# Patient Record
Sex: Female | Born: 1935 | Race: White | Hispanic: No | Smoking: Former smoker
Health system: Southern US, Community
[De-identification: ages and names within clinical notes are randomized; demographics above are authoritative.]

## PROBLEM LIST (undated history)

## (undated) DIAGNOSIS — Z8619 Personal history of other infectious and parasitic diseases: Secondary | ICD-10-CM

## (undated) DIAGNOSIS — R0982 Postnasal drip: Secondary | ICD-10-CM

## (undated) DIAGNOSIS — M169 Osteoarthritis of hip, unspecified: Secondary | ICD-10-CM

## (undated) DIAGNOSIS — M1990 Unspecified osteoarthritis, unspecified site: Secondary | ICD-10-CM

## (undated) DIAGNOSIS — C4491 Basal cell carcinoma of skin, unspecified: Secondary | ICD-10-CM

## (undated) DIAGNOSIS — E039 Hypothyroidism, unspecified: Secondary | ICD-10-CM

## (undated) DIAGNOSIS — E049 Nontoxic goiter, unspecified: Secondary | ICD-10-CM

## (undated) DIAGNOSIS — M8580 Other specified disorders of bone density and structure, unspecified site: Secondary | ICD-10-CM

## (undated) DIAGNOSIS — T4145XA Adverse effect of unspecified anesthetic, initial encounter: Secondary | ICD-10-CM

## (undated) DIAGNOSIS — H9190 Unspecified hearing loss, unspecified ear: Secondary | ICD-10-CM

## (undated) DIAGNOSIS — T8859XA Other complications of anesthesia, initial encounter: Secondary | ICD-10-CM

## (undated) DIAGNOSIS — K5730 Diverticulosis of large intestine without perforation or abscess without bleeding: Secondary | ICD-10-CM

## (undated) DIAGNOSIS — G43909 Migraine, unspecified, not intractable, without status migrainosus: Secondary | ICD-10-CM

## (undated) HISTORY — DX: Osteoarthritis of hip, unspecified: M16.9

## (undated) HISTORY — DX: Personal history of other infectious and parasitic diseases: Z86.19

## (undated) HISTORY — PX: CERVICAL SPINE SURGERY: SHX589

## (undated) HISTORY — PX: EYE SURGERY: SHX253

## (undated) HISTORY — PX: BASAL CELL CARCINOMA EXCISION: SHX1214

## (undated) HISTORY — PX: INNER EAR SURGERY: SHX679

## (undated) HISTORY — DX: Migraine, unspecified, not intractable, without status migrainosus: G43.909

## (undated) HISTORY — PX: MASTOIDECTOMY: SHX711

## (undated) HISTORY — PX: KNEE ARTHROSCOPY: SUR90

## (undated) HISTORY — PX: TONSILLECTOMY: SUR1361

## (undated) HISTORY — PX: NASAL SEPTUM SURGERY: SHX37

## (undated) HISTORY — DX: Other specified disorders of bone density and structure, unspecified site: M85.80

## (undated) HISTORY — DX: Diverticulosis of large intestine without perforation or abscess without bleeding: K57.30

## (undated) HISTORY — DX: Unspecified hearing loss, unspecified ear: H91.90

---

## 1975-07-06 DIAGNOSIS — C4491 Basal cell carcinoma of skin, unspecified: Secondary | ICD-10-CM

## 1975-07-06 HISTORY — DX: Basal cell carcinoma of skin, unspecified: C44.91

## 1975-08-03 DIAGNOSIS — C4491 Basal cell carcinoma of skin, unspecified: Secondary | ICD-10-CM

## 1975-08-03 HISTORY — DX: Basal cell carcinoma of skin, unspecified: C44.91

## 1999-08-03 ENCOUNTER — Other Ambulatory Visit: Admission: RE | Admit: 1999-08-03 | Discharge: 1999-08-03 | Payer: Self-pay | Admitting: *Deleted

## 2000-08-09 ENCOUNTER — Other Ambulatory Visit: Admission: RE | Admit: 2000-08-09 | Discharge: 2000-08-09 | Payer: Self-pay | Admitting: *Deleted

## 2000-09-15 DIAGNOSIS — C4492 Squamous cell carcinoma of skin, unspecified: Secondary | ICD-10-CM

## 2000-09-15 HISTORY — DX: Squamous cell carcinoma of skin, unspecified: C44.92

## 2003-03-17 ENCOUNTER — Ambulatory Visit (HOSPITAL_COMMUNITY): Admission: RE | Admit: 2003-03-17 | Discharge: 2003-03-17 | Payer: Self-pay | Admitting: Neurosurgery

## 2008-09-03 ENCOUNTER — Ambulatory Visit (HOSPITAL_COMMUNITY): Admission: RE | Admit: 2008-09-03 | Discharge: 2008-09-06 | Payer: Self-pay | Admitting: Obstetrics and Gynecology

## 2008-09-03 ENCOUNTER — Encounter (INDEPENDENT_AMBULATORY_CARE_PROVIDER_SITE_OTHER): Payer: Self-pay | Admitting: Obstetrics and Gynecology

## 2009-01-09 HISTORY — PX: ABDOMINAL HYSTERECTOMY: SHX81

## 2010-04-16 LAB — DIFFERENTIAL
Basophils Relative: 1 % (ref 0–1)
Lymphocytes Relative: 38 % (ref 12–46)
Lymphs Abs: 3.1 10*3/uL (ref 0.7–4.0)
Monocytes Absolute: 0.5 10*3/uL (ref 0.1–1.0)
Monocytes Relative: 6 % (ref 3–12)
Neutro Abs: 4.3 10*3/uL (ref 1.7–7.7)
Neutrophils Relative %: 53 % (ref 43–77)

## 2010-04-16 LAB — CBC
HCT: 31.8 % — ABNORMAL LOW (ref 36.0–46.0)
HCT: 34.5 % — ABNORMAL LOW (ref 36.0–46.0)
HCT: 39 % (ref 36.0–46.0)
Hemoglobin: 10.8 g/dL — ABNORMAL LOW (ref 12.0–15.0)
Hemoglobin: 11.2 g/dL — ABNORMAL LOW (ref 12.0–15.0)
Hemoglobin: 11.8 g/dL — ABNORMAL LOW (ref 12.0–15.0)
Hemoglobin: 13.3 g/dL (ref 12.0–15.0)
MCHC: 34 g/dL (ref 30.0–36.0)
MCHC: 34.2 g/dL (ref 30.0–36.0)
MCHC: 34.2 g/dL (ref 30.0–36.0)
MCHC: 34.6 g/dL (ref 30.0–36.0)
MCV: 94.3 fL (ref 78.0–100.0)
Platelets: 212 10*3/uL (ref 150–400)
RBC: 3.33 MIL/uL — ABNORMAL LOW (ref 3.87–5.11)
RBC: 3.42 MIL/uL — ABNORMAL LOW (ref 3.87–5.11)
RBC: 3.66 MIL/uL — ABNORMAL LOW (ref 3.87–5.11)
RDW: 13.6 % (ref 11.5–15.5)
RDW: 13.7 % (ref 11.5–15.5)
RDW: 13.7 % (ref 11.5–15.5)
RDW: 13.7 % (ref 11.5–15.5)

## 2010-04-16 LAB — COMPREHENSIVE METABOLIC PANEL
Albumin: 4 g/dL (ref 3.5–5.2)
BUN: 14 mg/dL (ref 6–23)
Calcium: 9.2 mg/dL (ref 8.4–10.5)
Creatinine, Ser: 0.74 mg/dL (ref 0.4–1.2)
Glucose, Bld: 79 mg/dL (ref 70–99)
Potassium: 3.9 mEq/L (ref 3.5–5.1)
Total Protein: 7.3 g/dL (ref 6.0–8.3)

## 2010-04-16 LAB — CREATININE, SERUM: Creatinine, Ser: 0.69 mg/dL (ref 0.4–1.2)

## 2010-05-24 NOTE — Discharge Summary (Signed)
Virginia Bullock, CAPES NO.:  [id number]   MEDICAL RECORD NO.:  [id number]          PATIENT TYPE:  OIB   LOCATION:  9302                          FACILITY:  [facility]   PHYSICIAN:  Malachi Pro. Ambrose Mantle, M.D. DATE OF BIRTH:  Feb 28, 1928   DATE OF ADMISSION:  09/03/2008  DATE OF DISCHARGE:  09/06/2008                               DISCHARGE SUMMARY   HISTORY OF PRESENT ILLNESS:  This is a 76 year old white female who was  admitted for uterine prolapse, cystocele, and rectocele.  The patient  underwent a vaginal hysterectomy, A and P repair on September 03, 2008 by  Dr. Ambrose Mantle under general anesthesia.  Catheter was removed on the first  postop day.  She was unable to void, she had the catheter replaced.  Second postop day, she was able to void only small drops.  Catheter was  replaced.  On the third postop day, she is able to void in more  significant amounts, but still has a large residual urine.  As the day  progresses, she is going to try to voiding.  If she is unable to get her  residual urine less than 200 mL, she is going to be discharged with the  catheter in place.  She has remained afebrile throughout the  hospitalization.  Her initial hemoglobin was 13.3, hematocrit 39, white  count 81,100, and platelet count 212,000.  Followup hemoglobin 11.8,  10.8, and 11.2.  Comprehensive metabolic profile was completely within  normal limits.  Estimated glomerular filtration rate was greater than  60.  The patient has been very distraught about possibly having to go  home with the catheter.  I have instructed her that presence of the  catheter is not a sole indication for keeping one in the hospital.  She  has been given an adequate trial to see if she can void.  She has also  been instructed that no urinary obstruction is present.  Otherwise,  catheter would not go in, and then hopefully, her bladder will respond  before discharge.  At the present time, the patient has agreed to  pack.  If she cannot get her residual urine to less than 200 mL, she will go  home with the catheter and return to my office in 3 days with the  catheter out, having been removed early that morning to check on her  postvoid residual.   FINAL DIAGNOSES:  Uterine prolapse with cystocele and rectocele, urinary  retention.   OPERATION:  Vaginal hysterectomy, A and P repair.   PATHOLOGY REPORT:  Pending.   The patient will be discharged on Cipro 500 mg by mouth every 12 hours  for 7 days because of repeated catheterizations.  She will return to me  in either 3 days or 2 weeks.  Followup examination depending on whether  she goes home with or without a cath.  Pack was removed on the first  postop day from her vagina.      Malachi Pro. Ambrose Mantle, M.D.  Electronically Signed     TFH/MEDQ  D:  09/06/2008  T:  09/06/2008  Job:  [id number] 

## 2010-05-24 NOTE — H&P (Signed)
NAME:  Virginia Bullock, NATH NO.:  [id number]   MEDICAL RECORD NO.:  [id number]          PATIENT TYPE:  AMB   LOCATION:  SDC                           [facility]ILITY:  [facility]   PHYSICIAN:  Malachi Pro. Ambrose Mantle, M.D. DATE OF BIRTH:  [birth year]   DATE OF ADMISSION:  DATE OF DISCHARGE:                              HISTORY & PHYSICAL   HISTORY OF PRESENT ILLNESS:  This is a 76 year old white widowed female,  para 5-0-0-5, who is admitted for vaginal hysterectomy/A and P repair  because of third to fourth degree cystocele that is symptomatic.  This  patient saw me on July 15, 2008, stating that she had noted a protrusion  from her vagina for approximately one month.  She stated she had some  stress urinary incontinence for years but that was not a problem.  Her  husband had died in 84.  She had had no sex since.  She occasionally  feels the urge to urinate when on her feet, goes to the restroom and  cannot void.  She is very active physically, mows her own yard.  On the  examination when I saw her July 7 her thyroid felt to be slightly  enlarged and almost substernal so I did a TSH which was returned as  0.233.  I called her medical doctor and in her absence an associate, Dr.  Azucena Cecil, spoke to me and he made an appointment for the patient and the  patient was told that everything was okay.  She wants to have the  protrusion repaired after being offered observation or pessary or  surgery.   PAST MEDICAL HISTORY:   ALLERGIES:  NO KNOWN DRUG ALLERGIES.   MEDICATIONS:  The patient does not take any medications except vitamins.   She has no major illnesses.   OPERATIONS:  1. She did have a ruptured disk in 1992.  2. Left knee surgery in 1981.  3. Benign breast tumor on the right in 1975.  4. As a child she had two mastoidectomies.   FAMILY HISTORY:  Her father died at 28 in World War II.  Mother died at  68.  One half sister is living and well.   SOCIAL HISTORY:  The patient does  not smoke.  She drinks some.  Her last  period was at age 47.   PHYSICAL EXAMINATION:  VITAL SIGNS:  Reveals multiple premature beats.  Her pulse is 60, blood pressure is 110/78.  She is 5 feet 2 inches.  weighs 134 pounds.   HEENT:  Reveals no cranial abnormalities.  Extraocular movements are  intact.  Nose and pharynx are clear.  NECK:  Supple.  There is slight enlargement of the thyroid.  LUNGS:  Clear to auscultation.  BREASTS:  Soft without masses.  HEART:  Normal size.  There are premature beats.  Rate is 60.  ABDOMEN:  Soft and nontender.  No masses are palpable.  EXTREMITIES:  Negative.  PELVIC:  The vulva and vagina are clean.  There is a third to fourth  degree cystocele.  The cervix is clean.  Uterus is hard to  feel.  Adnexa  are free of masses.  RECTAL:  Exam was negative.   At the time of her first exam a Pap smear was reported as atypical  glandular cells of undetermined significance.  Colposcopy revealed no  abnormalities.  Cervical biopsies were negative.  Endocervical curettage  showed benign endocervical mucosa and endometrial biopsy showed scanty  superficial strips of benign inactive endometrium.  No hyperplasia or  malignancy was identified.   IMPRESSION:  Uterine prolapse, third to fourth degree cystocele, smaller  rectocele.  The patient is admitted for vaginal hysterectomy/A and P  repair.  She understands the risks of surgery include, but are not  limited to, heart attack, stroke, pulmonary embolus, wound disruption,  hemorrhage with need for reoperation and/or transfusion, fistula  formation, nerve injury, intestinal obstruction.  She understands and  agrees to proceed.      Malachi Pro. Ambrose Mantle, M.D.  Electronically Signed     TFH/MEDQ  D:  09/02/2008  T:  09/02/2008  Job:  [id number]

## 2010-05-24 NOTE — Op Note (Signed)
NAMELATARRA, EAGLETON NO.:  [id number]   MEDICAL RECORD NO.:  [id number]          PATIENT TYPE:  OIB   LOCATION:  9302                          [facility]ACILITY:  [facility]   PHYSICIAN:  Malachi Pro. Ambrose Mantle, M.D. DATE OF BIRTH:  1928/03/21   DATE OF PROCEDURE:  09/03/2008  DATE OF DISCHARGE:                               OPERATIVE REPORT   PREOPERATIVE DIAGNOSES:  Cystocele, rectocele, and uterine prolapse.   POSTOPERATIVE DIAGNOSES:  Cystocele, rectocele, and uterine prolapse.   OPERATION:  Vaginal hysterectomy, anterior and posterior repair.   OPERATOR:  Malachi Pro. Ambrose Mantle, MD   ASSISTANT:  Zenaida Niece, MD   ANESTHESIA:  General anesthesia.   DESCRIPTION OF PROCEDURE:  The patient was brought to the operating room  and placed under satisfactory general anesthesia, placed in lithotomy  position in [place] stirrups.  The vulva, vagina, perineum, and urethra  were prepped with Betadine solution.  A Foley catheter was inserted to  straight drain.  Exam revealed the uterus to be hard to feel probably  midplane and small.  The adnexa were free of masses.  There was a third  to fourth degree cystocele and a third-degree rectocele.  The area was  draped as a sterile field.  The cervix was drawn into the operative  field after weighted speculum was placed posteriorly and a Deaver  retractor anteriorly and the cervicovaginal junction was injected with a  dilute solution of Neo-Synephrine.  A circumferential incision was made  around the cervix at the cervicovaginal junction.  The anterior vaginal  mucosa was pushed ahead.  The anterior peritoneum was identified, but  not entered.  The posterior peritoneum was identified, entered, and then  the anterior peritoneum was identified, and the bladder was retracted  away.  Confirmation within the peritoneal cavity anteriorly was done by  seeing the appendiceal epiploica.  Both uterosacral ligaments were  clamped, cut, and suture-ligated  and held.  The cardinal ligaments were  clamped, cut, and suture-ligated.  Parametrial tissues was handled in  the same fashion.  The uterus was quite vascular.  Backbleeding occurred  on both sides.  The uterus was then removed by transecting the upper  pedicles and doubly suture ligating them.  The posterior vaginal mucosa  was sewn to the posterior peritoneum with a running lock suture of 0  Vicryl and a pursestring suture was then placed from the anterior  peritoneum through the left upper pedicle, left uterosacral ligament,  posterior peritoneum, right uterosacral ligament, right upper pedicle,  and back to the anterior peritoneum.  There was no bleeding inside the  peritoneal cavity.  This suture was then tied down.  The upper pedicles  were cut away.  I did before closing the peritoneum, placed another  suture of 0 Vicryl through the uterosacral ligaments above where they  were held and they were initially cut.  The sutures holding the  uterosacral ligaments at this point were then tied together in the  midline.  The anterior vaginal mucosa was then separated from this large  cystocele all the way to the urethral meatus.  The  cystocele was  developed, was some bleeding controlled by the Bovie, and also sutures  of 3-0 Vicryl.  The cystocele was then obliterated with multiple  interrupted sutures of 0 Vicryl.  The redundant vaginal mucosa was cut  away and the vaginal mucosa was then reunited in the midline with  interrupted figure-of-eight sutures of 0 Vicryl and the vaginal cuff was  closed with the same suture material and with figure-of-eights.  The  posterior vaginal mucosa was then undermined to the cuff.  The rectocele  was developed and then before correcting the rectocele, I did a rectal  exam to ensure there has been no damage to the rectum.  I then  obliterated the rectocele with interrupted sutures of 0 Vicryl, cut away  redundant vaginal mucosa, and reapproximated the  vaginal mucosa in the  midline actually to the left of the midline with interrupted figure-of-  eight sutures of 0 Vicryl.  One suture was placed into the levator ani  muscles to give better support to the perineum.  Additional suture of 3-  0 Vicryl was used for complete hemostasis.  A 2-inch Iodoform pack was  placed into the vagina and the procedure was terminated.  Blood loss was  estimated by the nurse anesthetist at 250 mL.  Sponge and needle counts  were correct and the patient was returned to recovery in satisfactory  condition.      Malachi Pro. Ambrose Mantle, M.D.  Electronically Signed     TFH/MEDQ  D:  09/03/2008  T:  09/04/2008  Job:  [id number]

## 2011-09-28 ENCOUNTER — Other Ambulatory Visit: Payer: Self-pay | Admitting: Dermatology

## 2011-10-12 ENCOUNTER — Other Ambulatory Visit: Payer: Self-pay | Admitting: Dermatology

## 2013-01-07 ENCOUNTER — Other Ambulatory Visit: Payer: Self-pay | Admitting: Dermatology

## 2013-05-07 ENCOUNTER — Other Ambulatory Visit: Payer: Self-pay | Admitting: Dermatology

## 2014-02-05 ENCOUNTER — Ambulatory Visit: Payer: Self-pay | Admitting: Orthopedic Surgery

## 2014-02-05 NOTE — H&P (Signed)
Virginia Bullock DOB: Apr 08, 1928 Widowed / Language: Undefined / Race: White Female Date of Admission:  02/20/2014 CC:  Right Hip Pain History of Present Illness The patient is a 81 year old female who comes in  for a preoperative History and Physical. The patient is scheduled for a right total hip arthroplasty (anterior approach) to be performed by Dr. [name]. Aluisio, MD at [facility] on 02-20-2014. The patient reports right posterior hip problems including pain, stiffness and locking symptoms that have been present for many month(s). The symptoms began without any known injury. Symptoms reported include weakness, pain with weightbearing, night pain, stiffness and difficulty bearing weight The patient reports symptoms radiating to the: right thigh and right knee. The patient describes the hip problem as aching. Onset of symptoms was gradual.The symptoms are described as severe.The patient feels as if their symptoms are does feel they are worsening. Symptoms are exacerbated by weight bearing, walking and lying on the affected side. Symptoms are relieved by rest and nonsteroidal anti-inflammatory drugs. Current treatment includes nonsteroidal anti-inflammatory drugs. She has had accupuncture and manipulation. She states the chiropractor told her she needed a hip replacement many years ago. She states the pain will even radiate down to her right knee. She has had problems with her hips for years, but unfortunately in the past 3-4 months, it has gotten progressively worse. The pain is throughout the hip lateral and anterior, going into her thigh. She has lost a lot of movement. She is having pain with activity but also now having pain at rest. It is definitely limiting what she can and cannot do. She had been treated by a chiropractor, Dr. [name], and upon initiating treatment, he felt that the problem was from her hip. He subsequently sent her to [facility] for evaluation. She is ready to proceed with  hip replacement. They have been treated conservatively in the past for the above stated problem and despite conservative measures, they continue to have progressive pain and severe functional limitations and dysfunction. They have failed non-operative management including home exercise, medications. It is felt that they would benefit from undergoing total joint replacement. Risks and benefits of the procedure have been discussed with the patient and they elect to proceed with surgery. There are no active contraindications to surgery such as ongoing infection or rapidly progressive neurological disease.  Problem List/Past Medical Primary osteoarthritis of right hip (M16.11) Lactose intolerance (E73.9) Skin Cancer Squamous Cell and Basal Cell Migraine Headache Hypothyroidism Osteopenia Diverticulosis Shingles Impaired Hearing  Allergies Demerol *ANALGESICS - OPIOID* Vomiting. Codeine Sulfate *ANALGESICS - OPIOID* Vomiting. Benadryl *ANTIHISTAMINES* "sends me to the moon"  Family History First Degree Relatives reported Family history unknown - Adopted  Social History  Living situation live alone Marital status widowed Exercise Exercises daily; does other Children 5 or more Current drinker 11/20/2013: Currently drinks wine 5-7 times per week No history of drug/alcohol rehab Tobacco use Former smoker. 11/20/2013: smoke(d) 1 pack(s) per day Not under pain contract Tobacco / smoke exposure 11/20/2013: no  Medication History  Advil (200MG  Capsule, 1 (one) Oral) Active. Vitamin B-12 (1000MCG Tablet, 1 (one) Oral) Active. Vitamin C (500MG  Tablet, 1 (one) Oral) Active. Vitamin D (400UNIT Capsule, 1 (one) Oral) Active. (D3) Potassium Chloride (Oral) Specific dose unknown - Active. Magnesium Gluconate (Oral) Specific dose unknown - Active.   Past Surgical History Neck Disc Surgery Fusion Spinal Fusion neck Mastoidectomy bilateral; age 39 Arthroscopy of  Knee left Hysterectomy Date: 2011. partial (non-cancerous) Straighten Nasal Septum Tonsillectomy Twice -  age 41 and age 39   Review of Systems General Not Present- Chills, Fatigue, Fever, Memory Loss, Night Sweats, Weight Gain and Weight Loss. Skin Not Present- Eczema, Hives, Itching, Lesions and Rash. HEENT Not Present- Dentures, Double Vision, Headache, Hearing Loss, Tinnitus and Visual Loss. Respiratory Not Present- Allergies, Chronic Cough, Coughing up blood, Shortness of breath at rest and Shortness of breath with exertion. Cardiovascular Not Present- Chest Pain, Difficulty Breathing Lying Down, Murmur, Palpitations, Racing/skipping heartbeats and Swelling. Gastrointestinal Not Present- Abdominal Pain, Bloody Stool, Constipation, Diarrhea, Difficulty Swallowing, Heartburn, Jaundice, Loss of appetitie, Nausea and Vomiting. Female Genitourinary Not Present- Blood in Urine, Discharge, Flank Pain, Incontinence, Painful Urination, Urgency, Urinary frequency, Urinary Retention, Urinating at Night and Weak urinary stream. Musculoskeletal Not Present- Back Pain, Joint Pain, Joint Swelling, Morning Stiffness, Muscle Pain, Muscle Weakness and Spasms. Neurological Not Present- Blackout spells, Difficulty with balance, Dizziness, Paralysis, Tremor and Weakness. Psychiatric Not Present- Insomnia.  Vitals Weight: 128 lb Height: 62in Weight was reported by patient. Height was reported by patient. Body Surface Area: 1.58 m Body Mass Index: 23.41 kg/m  BP: 108/62 (Sitting, Left Arm, Standard)   Physical Exam  General Mental Status -Alert, cooperative and good historian. General Appearance-pleasant, Not in acute distress. Orientation-Oriented X3. Build & Nutrition-Well nourished and Well developed.  Head and Neck Head-normocephalic, atraumatic . Neck Global Assessment - supple, no bruit auscultated on the right, no bruit auscultated on the left. Note: usually wears one  hearing aid   Eye Vision-Wears contact lenses. Pupil - Bilateral-Regular and Round. Motion - Bilateral-EOMI.  Chest and Lung Exam Auscultation Breath sounds - clear at anterior chest wall and clear at posterior chest wall. Adventitious sounds - No Adventitious sounds.  Cardiovascular Auscultation Rhythm - Regular rate and rhythm. Heart Sounds - S1 WNL and S2 WNL. Murmurs & Other Heart Sounds - Auscultation of the heart reveals - No Murmurs.  Abdomen Palpation/Percussion Tenderness - Abdomen is non-tender to palpation. Rigidity (guarding) - Abdomen is soft. Auscultation Auscultation of the abdomen reveals - Bowel sounds normal.  Female Genitourinary Note: Not done, not pertinent to present illness   Musculoskeletal Note: On examination, she is alert and oriented in no apparent distress. Her left hip has normal range of motion with no discomfort. The right hip can be flexed to 100 with no internal rotation, about 10 degrees of external rotation, 10-20 degrees of abduction. She is nontender about the hip. Her knee examination is unremarkable. Pulse, sensation and motor are intact distally. She walks with an antalgic gait pattern.  RADIOGRAPHS: Her radiographs AP pelvis and lateral of the right hip show bone-on-bone arthritis in the right hip with subchondral cystic formation. The left hip looks fairly normal.   Assessment & Plan Primary osteoarthritis of right hip (M16.11) Note:Surgical Plans: Right Total Hip Repalcement - Anterior Approach  Disposition: [place]  PCP: Dr. Harlan Stains - Patient has been seen preoperatively and felt to be stable for surgery. Please contact the [facility] if assistance is needed at the time of surgery.  IV TXA  Anesthesia Issues: Would prefer a general anesthesia  Signed electronically by Joelene Millin, III PA-C

## 2014-02-11 NOTE — Progress Notes (Signed)
Please put orders in Epic surgery 02-20-14 pre op 02-16-14 Thanks

## 2014-02-12 ENCOUNTER — Ambulatory Visit: Payer: Self-pay | Admitting: Orthopedic Surgery

## 2014-02-12 NOTE — Progress Notes (Signed)
Preoperative surgical orders have been place into the Epic hospital system for Virginia Bullock on 02/12/2014, 1:03 PM  by Mickel Crow for surgery on 02-19-2014.  Preop Total Hip - Anterior Approach orders including Experel Injecion, IV Tylenol, and IV Decadron as long as there are no contraindications to the above medications. Arlee Muslim, PA-C

## 2014-02-13 NOTE — Patient Instructions (Addendum)
Virginia Bullock  02/13/2014   Your procedure is scheduled on: Friday 02/20/14  Report to [facility] Main  Entrance and follow signs to               [place] at 10:15 AM.  Call this number if you have problems the morning of surgery [phone]   Remember:  Do not eat food After Midnight. Clear liquids from midnight until 07:15 am on 02/20/14 then nothing.                                  You may not have any metal on your body including hair pins and              piercings  Do not wear jewelry, make-up, lotions, powders or perfumes.             Do not wear nail polish.  Do not shave  48 hours prior to surgery.              Men may shave face and neck.  Do not bring valuables to the hospital. [place].  Contacts, dentures or bridgework may not be worn into surgery.  Leave suitcase in the car. After surgery it may be brought to your room.               Please read over the following fact sheets you were given: MRSA information _____________________________________________________________________             [facility] - Preparing for Surgery Before surgery, you can play an important role.  Because skin is not sterile, your skin needs to be as free of germs as possible.  You can reduce the number of germs on your skin by washing with CHG (chlorahexidine gluconate) soap before surgery.  CHG is an antiseptic cleaner which kills germs and bonds with the skin to continue killing germs even after washing. Please DO NOT use if you have an allergy to CHG or antibacterial soaps.  If your skin becomes reddened/irritated stop using the CHG and inform your nurse when you arrive at Short Stay. Do not shave (including legs and underarms) for at least 48 hours prior to the first CHG shower.  You may shave your face/neck. Please follow these instructions carefully:  1.  Shower with CHG Soap the night before surgery and  the  morning of Surgery.  2.  If you choose to wash your hair, wash your hair first as usual with your  normal  shampoo.  3.  After you shampoo, rinse your hair and body thoroughly to remove the  shampoo.                            4.  Use CHG as you would any other liquid soap.  You can apply chg directly  to the skin and wash                       Gently with a scrungie or clean washcloth.  5.  Apply the CHG Soap to your body ONLY FROM THE NECK DOWN.   Do not use on face/ open  Wound or open sores. Avoid contact with eyes, ears mouth and genitals (private parts).                       Wash face,  Genitals (private parts) with your normal soap.             6.  Wash thoroughly, paying special attention to the area where your surgery  will be performed.  7.  Thoroughly rinse your body with warm water from the neck down.  8.  DO NOT shower/wash with your normal soap after using and rinsing off  the CHG Soap.                9.  Pat yourself dry with a clean towel.            10.  Wear clean pajamas.            11.  Place clean sheets on your bed the night of your first shower and do not  sleep with pets. Day of Surgery : Do not apply any lotions/deodorants the morning of surgery.  Please wear clean clothes to the hospital/surgery center.  FAILURE TO FOLLOW THESE INSTRUCTIONS MAY RESULT IN THE CANCELLATION OF YOUR SURGERY PATIENT SIGNATURE_________________________________  NURSE SIGNATURE__________________________________  ________________________________________________________________________  WHAT IS A BLOOD TRANSFUSION? Blood Transfusion Information  A transfusion is the replacement of blood or some of its parts. Blood is made up of multiple cells which provide different functions.  Red blood cells carry oxygen and are used for blood loss replacement.  White blood cells fight against infection.  Platelets control bleeding.  Plasma helps clot blood.  Other  blood products are available for specialized needs, such as hemophilia or other clotting disorders. BEFORE THE TRANSFUSION  Who gives blood for transfusions?   Healthy volunteers who are fully evaluated to make sure their blood is safe. This is blood bank blood. Transfusion therapy is the safest it has ever been in the practice of medicine. Before blood is taken from a donor, a complete history is taken to make sure that person has no history of diseases nor engages in risky social behavior (examples are intravenous drug use or sexual activity with multiple partners). The donor's travel history is screened to minimize risk of transmitting infections, such as malaria. The donated blood is tested for signs of infectious diseases, such as HIV and hepatitis. The blood is then tested to be sure it is compatible with you in order to minimize the chance of a transfusion reaction. If you or a relative donates blood, this is often done in anticipation of surgery and is not appropriate for emergency situations. It takes many days to process the donated blood. RISKS AND COMPLICATIONS Although transfusion therapy is very safe and saves many lives, the main dangers of transfusion include:  1. Getting an infectious disease. 2. Developing a transfusion reaction. This is an allergic reaction to something in the blood you were given. Every precaution is taken to prevent this. The decision to have a blood transfusion has been considered carefully by your caregiver before blood is given. Blood is not given unless the benefits outweigh the risks. AFTER THE TRANSFUSION  Right after receiving a blood transfusion, you will usually feel much better and more energetic. This is especially true if your red blood cells have gotten low (anemic). The transfusion raises the level of the red blood cells which carry oxygen, and this usually causes an energy increase.  The  nurse administering the transfusion will monitor you carefully  for complications. HOME CARE INSTRUCTIONS  No special instructions are needed after a transfusion. You may find your energy is better. Speak with your caregiver about any limitations on activity for underlying diseases you may have. SEEK MEDICAL CARE IF:   Your condition is not improving after your transfusion.  You develop redness or irritation at the intravenous (IV) site. SEEK IMMEDIATE MEDICAL CARE IF:  Any of the following symptoms occur over the next 12 hours:  Shaking chills.  You have a temperature by mouth above 102 F (38.9 C), not controlled by medicine.  Chest, back, or muscle pain.  People around you feel you are not acting correctly or are confused.  Shortness of breath or difficulty breathing.  Dizziness and fainting.  You get a rash or develop hives.  You have a decrease in urine output.  Your urine turns a dark color or changes to pink, red, or brown. Any of the following symptoms occur over the next 10 days:  You have a temperature by mouth above 102 F (38.9 C), not controlled by medicine.  Shortness of breath.  Weakness after normal activity.  The white part of the eye turns yellow (jaundice).  You have a decrease in the amount of urine or are urinating less often.  Your urine turns a dark color or changes to pink, red, or brown. Document Released: 12/24/1999 Document Revised: 03/20/2011 Document Reviewed: 08/12/2007 ExitCare Patient Information 2014 [place].  _______________________________________________________________________  Incentive Spirometer  An incentive spirometer is a tool that can help keep your lungs clear and active. This tool measures how well you are filling your lungs with each breath. Taking long deep breaths may help reverse or decrease the chance of developing breathing (pulmonary) problems (especially infection) following:  A long period of time when you are unable to move or be active. BEFORE THE PROCEDURE    If the spirometer includes an indicator to show your best effort, your nurse or respiratory therapist will set it to a desired goal.  If possible, sit up straight or lean slightly forward. Try not to slouch.  Hold the incentive spirometer in an upright position. INSTRUCTIONS FOR USE  3. Sit on the edge of your bed if possible, or sit up as far as you can in bed or on a chair. 4. Hold the incentive spirometer in an upright position. 5. Breathe out normally. 6. Place the mouthpiece in your mouth and seal your lips tightly around it. 7. Breathe in slowly and as deeply as possible, raising the piston or the ball toward the top of the column. 8. Hold your breath for 3-5 seconds or for as long as possible. Allow the piston or ball to fall to the bottom of the column. 9. Remove the mouthpiece from your mouth and breathe out normally. 10. Rest for a few seconds and repeat Steps 1 through 7 at least 10 times every 1-2 hours when you are awake. Take your time and take a few normal breaths between deep breaths. 11. The spirometer may include an indicator to show your best effort. Use the indicator as a goal to work toward during each repetition. 12. After each set of 10 deep breaths, practice coughing to be sure your lungs are clear. If you have an incision (the cut made at the time of surgery), support your incision when coughing by placing a pillow or rolled up towels firmly against it. Once you are able to get  out of bed, walk around indoors and cough well. You may stop using the incentive spirometer when instructed by your caregiver.  RISKS AND COMPLICATIONS  Take your time so you do not get dizzy or light-headed.  If you are in pain, you may need to take or ask for pain medication before doing incentive spirometry. It is harder to take a deep breath if you are having pain. AFTER USE  Rest and breathe slowly and easily.  It can be helpful to keep track of a log of your progress. Your caregiver  can provide you with a simple table to help with this. If you are using the spirometer at home, follow these instructions: [place] IF:   You are having difficultly using the spirometer.  You have trouble using the spirometer as often as instructed.  Your pain medication is not giving enough relief while using the spirometer.  You develop fever of 100.5 F (38.1 C) or higher. SEEK IMMEDIATE MEDICAL CARE IF:   You cough up bloody sputum that had not been present before.  You develop fever of 102 F (38.9 C) or greater.  You develop worsening pain at or near the incision site. MAKE SURE YOU:   Understand these instructions.  Will watch your condition.  Will get help right away if you are not doing well or get worse. Document Released: 05/08/2006 Document Revised: 03/20/2011 Document Reviewed: 07/09/2006 ExitCare Patient Information 2014 [place].   ________________________________________________________________________    CLEAR LIQUID DIET   Foods Allowed                                                                     Foods Excluded  Coffee and tea, regular and decaf                             liquids that you cannot  Plain Jell-O in any flavor                                             see through such as: Fruit ices (not with fruit pulp)                                     milk, soups, orange juice  Iced Popsicles                                    All solid food Carbonated beverages, regular and diet                                    Cranberry, grape and apple juices Sports drinks like Gatorade Lightly seasoned clear broth or consume(fat free) Sugar, honey syrup  Sample Menu Breakfast  Lunch                                     Supper Cranberry juice                    Beef broth                            Chicken broth Jell-O                                     Grape juice                           Apple  juice Coffee or tea                        Jell-O                                      Popsicle                                                Coffee or tea                        Coffee or tea  _____________________________________________________________________

## 2014-02-16 ENCOUNTER — Encounter (HOSPITAL_COMMUNITY): Payer: Self-pay

## 2014-02-16 ENCOUNTER — Encounter (HOSPITAL_COMMUNITY)
Admission: RE | Admit: 2014-02-16 | Discharge: 2014-02-16 | Disposition: A | Discharge status: Home / Self Care | Payer: Medicare Other | Source: Ambulatory Visit | Attending: Orthopedic Surgery | Admitting: Orthopedic Surgery

## 2014-02-16 DIAGNOSIS — Z01818 Encounter for other preprocedural examination: Secondary | ICD-10-CM | POA: Insufficient documentation

## 2014-02-16 DIAGNOSIS — M1611 Unilateral primary osteoarthritis, right hip: Secondary | ICD-10-CM | POA: Insufficient documentation

## 2014-02-16 HISTORY — DX: Hypothyroidism, unspecified: E03.9

## 2014-02-16 HISTORY — DX: Adverse effect of unspecified anesthetic, initial encounter: T41.45XA

## 2014-02-16 HISTORY — DX: Personal history of other infectious and parasitic diseases: Z86.19

## 2014-02-16 HISTORY — DX: Other complications of anesthesia, initial encounter: T88.59XA

## 2014-02-16 HISTORY — DX: Basal cell carcinoma of skin, unspecified: C44.91

## 2014-02-16 HISTORY — DX: Postnasal drip: R09.82

## 2014-02-16 HISTORY — DX: Unspecified osteoarthritis, unspecified site: M19.90

## 2014-02-16 LAB — SURGICAL PCR SCREEN
MRSA, PCR: NEGATIVE
Staphylococcus aureus: NEGATIVE

## 2014-02-16 LAB — CBC
HCT: 41.5 % (ref 36.0–46.0)
Hemoglobin: 13.5 g/dL (ref 12.0–15.0)
MCH: 31.4 pg (ref 26.0–34.0)
MCHC: 32.5 g/dL (ref 30.0–36.0)
MCV: 96.5 fL (ref 78.0–100.0)
Platelets: 226 10*3/uL (ref 150–400)
RBC: 4.3 MIL/uL (ref 3.87–5.11)
RDW: 13.4 % (ref 11.5–15.5)
WBC: 7.7 10*3/uL (ref 4.0–10.5)

## 2014-02-16 LAB — COMPREHENSIVE METABOLIC PANEL
ALBUMIN: 4 g/dL (ref 3.5–5.2)
ALT: 17 U/L (ref 0–35)
ANION GAP: 8 (ref 5–15)
AST: 27 U/L (ref 0–37)
Alkaline Phosphatase: 85 U/L (ref 39–117)
BUN: 22 mg/dL (ref 6–23)
CHLORIDE: 106 mmol/L (ref 96–112)
CO2: 26 mmol/L (ref 19–32)
Calcium: 9 mg/dL (ref 8.4–10.5)
Creatinine, Ser: 0.62 mg/dL (ref 0.50–1.10)
GFR calc Af Amer: >90 mL/min (ref >90)
GFR, EST NON AFRICAN AMERICAN: 81 mL/min — AB (ref >90)
GLUCOSE: 88 mg/dL (ref 70–99)
Potassium: 3.9 mmol/L (ref 3.5–5.1)
SODIUM: 140 mmol/L (ref 135–145)
Total Bilirubin: 0.8 mg/dL (ref 0.3–1.2)
Total Protein: 7.1 g/dL (ref 6.0–8.3)

## 2014-02-16 LAB — APTT: APTT: 31 s (ref 24–37)

## 2014-02-16 LAB — URINALYSIS, ROUTINE W REFLEX MICROSCOPIC
Bilirubin Urine: NEGATIVE
GLUCOSE, UA: NEGATIVE mg/dL
HGB URINE DIPSTICK: NEGATIVE
Ketones, ur: NEGATIVE mg/dL
Nitrite: NEGATIVE
Protein, ur: NEGATIVE mg/dL
SPECIFIC GRAVITY, URINE: 1.01 (ref 1.005–1.030)
Urobilinogen, UA: 0.2 mg/dL (ref 0.0–1.0)
pH: 7.5 (ref 5.0–8.0)

## 2014-02-16 LAB — URINE MICROSCOPIC-ADD ON

## 2014-02-16 LAB — PROTIME-INR
INR: 1.07 (ref 0.00–1.49)
Prothrombin Time: 14 seconds (ref 11.6–15.2)

## 2014-02-16 NOTE — Progress Notes (Signed)
Micro ua results faxed by epic to dr Boeing office

## 2014-02-16 NOTE — Progress Notes (Signed)
Surgery clearance note Dr. Dema Severin on chart, LOV note 12/03/13 Dr. Dema Severin on chart

## 2014-02-20 ENCOUNTER — Encounter (HOSPITAL_COMMUNITY)
Admission: RE | Disposition: A | Discharge status: Skilled Nursing Facility | Payer: Self-pay | Source: Ambulatory Visit | Attending: Orthopedic Surgery

## 2014-02-20 ENCOUNTER — Inpatient Hospital Stay (HOSPITAL_COMMUNITY): Payer: Medicare Other

## 2014-02-20 ENCOUNTER — Other Ambulatory Visit: Payer: Self-pay

## 2014-02-20 ENCOUNTER — Inpatient Hospital Stay (HOSPITAL_COMMUNITY): Payer: Medicare Other | Admitting: Anesthesiology

## 2014-02-20 ENCOUNTER — Encounter (HOSPITAL_COMMUNITY): Payer: Self-pay | Admitting: *Deleted

## 2014-02-20 ENCOUNTER — Inpatient Hospital Stay (HOSPITAL_COMMUNITY)
Admission: RE | Admit: 2014-02-20 | Discharge: 2014-02-23 | DRG: 470 | Disposition: A | Discharge status: Skilled Nursing Facility | Payer: Medicare Other | Source: Ambulatory Visit | Attending: Orthopedic Surgery | Admitting: Orthopedic Surgery

## 2014-02-20 DIAGNOSIS — M169 Osteoarthritis of hip, unspecified: Secondary | ICD-10-CM | POA: Diagnosis present

## 2014-02-20 DIAGNOSIS — Z981 Arthrodesis status: Secondary | ICD-10-CM

## 2014-02-20 DIAGNOSIS — M1611 Unilateral primary osteoarthritis, right hip: Principal | ICD-10-CM | POA: Diagnosis present

## 2014-02-20 DIAGNOSIS — M8580 Other specified disorders of bone density and structure, unspecified site: Secondary | ICD-10-CM | POA: Diagnosis present

## 2014-02-20 DIAGNOSIS — H9190 Unspecified hearing loss, unspecified ear: Secondary | ICD-10-CM | POA: Diagnosis present

## 2014-02-20 DIAGNOSIS — Z87891 Personal history of nicotine dependence: Secondary | ICD-10-CM

## 2014-02-20 DIAGNOSIS — Z01812 Encounter for preprocedural laboratory examination: Secondary | ICD-10-CM

## 2014-02-20 DIAGNOSIS — Z79899 Other long term (current) drug therapy: Secondary | ICD-10-CM

## 2014-02-20 DIAGNOSIS — E039 Hypothyroidism, unspecified: Secondary | ICD-10-CM | POA: Diagnosis present

## 2014-02-20 DIAGNOSIS — M25551 Pain in right hip: Secondary | ICD-10-CM | POA: Diagnosis present

## 2014-02-20 DIAGNOSIS — Z96649 Presence of unspecified artificial hip joint: Secondary | ICD-10-CM

## 2014-02-20 HISTORY — DX: Osteoarthritis of hip, unspecified: M16.9

## 2014-02-20 HISTORY — PX: TOTAL HIP ARTHROPLASTY: SHX124

## 2014-02-20 LAB — ABO/RH: ABO/RH(D): O POS

## 2014-02-20 LAB — TYPE AND SCREEN
ABO/RH(D): O POS
ANTIBODY SCREEN: NEGATIVE

## 2014-02-20 SURGERY — ARTHROPLASTY, HIP, TOTAL, ANTERIOR APPROACH
Anesthesia: Spinal | Site: Hip | Laterality: Right

## 2014-02-20 MED ORDER — ONDANSETRON HCL 4 MG/2ML IJ SOLN
INTRAMUSCULAR | Status: DC | PRN
  Administered 2014-02-20: 4 mg via INTRAVENOUS

## 2014-02-20 MED ORDER — CEFAZOLIN SODIUM-DEXTROSE 2-3 GM-% IV SOLR
INTRAVENOUS | Status: AC
  Filled 2014-02-20: qty 50

## 2014-02-20 MED ORDER — ACETAMINOPHEN 325 MG PO TABS: 650.0000 mg | ORAL_TABLET | Freq: Four times a day (QID) | ORAL | Status: DC | PRN

## 2014-02-20 MED ORDER — FENTANYL CITRATE 0.05 MG/ML IJ SOLN
INTRAMUSCULAR | Status: AC
  Filled 2014-02-20: qty 5

## 2014-02-20 MED ORDER — SODIUM CHLORIDE 0.9 % IV SOLN: INTRAVENOUS | Status: DC

## 2014-02-20 MED ORDER — POLYETHYLENE GLYCOL 3350 17 G PO PACK: 17.0000 g | PACK | Freq: Every day | ORAL | Status: DC | PRN

## 2014-02-20 MED ORDER — FENTANYL CITRATE 0.05 MG/ML IJ SOLN
INTRAMUSCULAR | Status: DC | PRN
  Administered 2014-02-20 (×5): 50 ug via INTRAVENOUS

## 2014-02-20 MED ORDER — FENTANYL CITRATE 0.05 MG/ML IJ SOLN
25.0000 ug | INTRAMUSCULAR | Status: DC | PRN
  Administered 2014-02-20 (×2): 50 ug via INTRAVENOUS

## 2014-02-20 MED ORDER — DOCUSATE SODIUM 100 MG PO CAPS
100.0000 mg | ORAL_CAPSULE | Freq: Two times a day (BID) | ORAL | Status: DC
  Administered 2014-02-21 – 2014-02-22 (×2): 100 mg via ORAL

## 2014-02-20 MED ORDER — LACTATED RINGERS IV SOLN
INTRAVENOUS | Status: DC
  Administered 2014-02-20: 1000 mL via INTRAVENOUS

## 2014-02-20 MED ORDER — KCL IN DEXTROSE-NACL 20-5-0.9 MEQ/L-%-% IV SOLN
INTRAVENOUS | Status: DC
  Administered 2014-02-20: 18:00:00 via INTRAVENOUS
  Filled 2014-02-20 (×4): qty 1000

## 2014-02-20 MED ORDER — METOCLOPRAMIDE HCL 5 MG/ML IJ SOLN
5.0000 mg | Freq: Three times a day (TID) | INTRAMUSCULAR | Status: DC | PRN
Start: 2014-02-20 — End: 2014-02-23

## 2014-02-20 MED ORDER — 0.9 % SODIUM CHLORIDE (POUR BTL) OPTIME
TOPICAL | Status: DC | PRN
  Administered 2014-02-20: 1000 mL

## 2014-02-20 MED ORDER — BUPIVACAINE HCL (PF) 0.25 % IJ SOLN
INTRAMUSCULAR | Status: DC | PRN
  Administered 2014-02-20: 20 mL

## 2014-02-20 MED ORDER — SODIUM CHLORIDE 0.9 % IJ SOLN
INTRAMUSCULAR | Status: AC
  Filled 2014-02-20: qty 10

## 2014-02-20 MED ORDER — MIDAZOLAM HCL 2 MG/2ML IJ SOLN
INTRAMUSCULAR | Status: AC
  Filled 2014-02-20: qty 2

## 2014-02-20 MED ORDER — FENTANYL CITRATE 0.05 MG/ML IJ SOLN
INTRAMUSCULAR | Status: AC
  Filled 2014-02-20: qty 2

## 2014-02-20 MED ORDER — HYDRALAZINE HCL 20 MG/ML IJ SOLN
INTRAMUSCULAR | Status: DC | PRN
  Administered 2014-02-20: 5 mg via INTRAVENOUS

## 2014-02-20 MED ORDER — METHOCARBAMOL 500 MG PO TABS
500.0000 mg | ORAL_TABLET | Freq: Four times a day (QID) | ORAL | Status: DC | PRN
  Administered 2014-02-21 – 2014-02-23 (×7): 500 mg via ORAL
  Filled 2014-02-20 (×7): qty 1

## 2014-02-20 MED ORDER — BUPIVACAINE LIPOSOME 1.3 % IJ SUSP
20.0000 mL | Freq: Once | INTRAMUSCULAR | Status: DC
  Filled 2014-02-20: qty 20

## 2014-02-20 MED ORDER — BISACODYL 10 MG RE SUPP: 10.0000 mg | Freq: Every day | RECTAL | Status: DC | PRN

## 2014-02-20 MED ORDER — ACETAMINOPHEN 10 MG/ML IV SOLN
1000.0000 mg | Freq: Once | INTRAVENOUS | Status: AC
  Administered 2014-02-20: 1000 mg via INTRAVENOUS
  Filled 2014-02-20: qty 100

## 2014-02-20 MED ORDER — DEXAMETHASONE SODIUM PHOSPHATE 10 MG/ML IJ SOLN
10.0000 mg | Freq: Once | INTRAMUSCULAR | Status: AC
  Administered 2014-02-20: 10 mg via INTRAVENOUS

## 2014-02-20 MED ORDER — CEFAZOLIN SODIUM-DEXTROSE 2-3 GM-% IV SOLR
2.0000 g | Freq: Four times a day (QID) | INTRAVENOUS | Status: AC
  Administered 2014-02-20 – 2014-02-21 (×2): 2 g via INTRAVENOUS
  Filled 2014-02-20 (×3): qty 50

## 2014-02-20 MED ORDER — ESMOLOL HCL 10 MG/ML IV SOLN
INTRAVENOUS | Status: DC | PRN
  Administered 2014-02-20: 20 mg via INTRAVENOUS

## 2014-02-20 MED ORDER — STERILE WATER FOR IRRIGATION IR SOLN
Status: DC | PRN
  Administered 2014-02-20: 1500 mL

## 2014-02-20 MED ORDER — SODIUM CHLORIDE 0.9 % IJ SOLN
INTRAMUSCULAR | Status: DC | PRN
  Administered 2014-02-20: 30 mL

## 2014-02-20 MED ORDER — POTASSIUM CHLORIDE CRYS ER 10 MEQ PO TBCR
10.0000 meq | EXTENDED_RELEASE_TABLET | Freq: Every day | ORAL | Status: DC
  Administered 2014-02-21 – 2014-02-23 (×3): 10 meq via ORAL
  Filled 2014-02-20 (×3): qty 1

## 2014-02-20 MED ORDER — DEXAMETHASONE SODIUM PHOSPHATE 10 MG/ML IJ SOLN
INTRAMUSCULAR | Status: AC
  Filled 2014-02-20: qty 1

## 2014-02-20 MED ORDER — ACETAMINOPHEN 500 MG PO TABS
1000.0000 mg | ORAL_TABLET | Freq: Four times a day (QID) | ORAL | Status: AC
  Administered 2014-02-20 – 2014-02-21 (×4): 1000 mg via ORAL
  Filled 2014-02-20 (×4): qty 2

## 2014-02-20 MED ORDER — CEFAZOLIN SODIUM-DEXTROSE 2-3 GM-% IV SOLR
2.0000 g | INTRAVENOUS | Status: AC
  Administered 2014-02-20: 2 g via INTRAVENOUS

## 2014-02-20 MED ORDER — PHENYLEPHRINE HCL 10 MG/ML IJ SOLN
INTRAMUSCULAR | Status: DC | PRN
  Administered 2014-02-20: 80 ug via INTRAVENOUS

## 2014-02-20 MED ORDER — ACETAMINOPHEN 650 MG RE SUPP: 650.0000 mg | Freq: Four times a day (QID) | RECTAL | Status: DC | PRN

## 2014-02-20 MED ORDER — METHOCARBAMOL 1000 MG/10ML IJ SOLN
500.0000 mg | Freq: Four times a day (QID) | INTRAVENOUS | Status: DC | PRN
  Administered 2014-02-20: 500 mg via INTRAVENOUS
  Filled 2014-02-20 (×2): qty 5

## 2014-02-20 MED ORDER — MENTHOL 3 MG MT LOZG
1.0000 | LOZENGE | OROMUCOSAL | Status: DC | PRN
Start: 2014-02-20 — End: 2014-02-23
  Filled 2014-02-20: qty 9

## 2014-02-20 MED ORDER — HYDROMORPHONE HCL 1 MG/ML IJ SOLN
INTRAMUSCULAR | Status: AC
  Filled 2014-02-20: qty 1

## 2014-02-20 MED ORDER — LIDOCAINE HCL (CARDIAC) 20 MG/ML IV SOLN
INTRAVENOUS | Status: DC | PRN
  Administered 2014-02-20: 50 mg via INTRAVENOUS

## 2014-02-20 MED ORDER — HYDROMORPHONE HCL 2 MG/ML IJ SOLN
INTRAMUSCULAR | Status: AC
  Filled 2014-02-20: qty 1

## 2014-02-20 MED ORDER — PROPOFOL 10 MG/ML IV BOLUS
INTRAVENOUS | Status: AC
  Filled 2014-02-20: qty 20

## 2014-02-20 MED ORDER — DEXAMETHASONE SODIUM PHOSPHATE 10 MG/ML IJ SOLN
10.0000 mg | Freq: Once | INTRAMUSCULAR | Status: AC
  Administered 2014-02-21: 10 mg via INTRAVENOUS
  Filled 2014-02-20: qty 1

## 2014-02-20 MED ORDER — HYDROMORPHONE HCL 1 MG/ML IJ SOLN: 0.5000 mg | INTRAMUSCULAR | Status: DC | PRN

## 2014-02-20 MED ORDER — LIDOCAINE HCL (CARDIAC) 20 MG/ML IV SOLN
INTRAVENOUS | Status: AC
  Filled 2014-02-20: qty 5

## 2014-02-20 MED ORDER — HYDRALAZINE HCL 20 MG/ML IJ SOLN
INTRAMUSCULAR | Status: AC
  Filled 2014-02-20: qty 1

## 2014-02-20 MED ORDER — TRAMADOL HCL 50 MG PO TABS
50.0000 mg | ORAL_TABLET | Freq: Four times a day (QID) | ORAL | Status: DC | PRN
  Administered 2014-02-23: 100 mg via ORAL
  Filled 2014-02-20 (×2): qty 2

## 2014-02-20 MED ORDER — NEOSTIGMINE METHYLSULFATE 10 MG/10ML IV SOLN
INTRAVENOUS | Status: DC | PRN
  Administered 2014-02-20: 3 mg via INTRAVENOUS

## 2014-02-20 MED ORDER — HYDROMORPHONE HCL 1 MG/ML IJ SOLN
INTRAMUSCULAR | Status: DC | PRN
  Administered 2014-02-20 (×4): 0.5 mg via INTRAVENOUS

## 2014-02-20 MED ORDER — EPHEDRINE SULFATE 50 MG/ML IJ SOLN
INTRAMUSCULAR | Status: AC
  Filled 2014-02-20: qty 1

## 2014-02-20 MED ORDER — PHENOL 1.4 % MT LIQD: 1.0000 | OROMUCOSAL | Status: DC | PRN

## 2014-02-20 MED ORDER — PROMETHAZINE HCL 25 MG/ML IJ SOLN
6.2500 mg | INTRAMUSCULAR | Status: DC | PRN
Start: 2014-02-20 — End: 2014-02-20
  Administered 2014-02-20: 6.25 mg via INTRAVENOUS

## 2014-02-20 MED ORDER — RIVAROXABAN 10 MG PO TABS
10.0000 mg | ORAL_TABLET | Freq: Every day | ORAL | Status: DC
  Administered 2014-02-21 – 2014-02-23 (×3): 10 mg via ORAL
  Filled 2014-02-20 (×4): qty 1

## 2014-02-20 MED ORDER — METOCLOPRAMIDE HCL 10 MG PO TABS: 5.0000 mg | ORAL_TABLET | Freq: Three times a day (TID) | ORAL | Status: DC | PRN

## 2014-02-20 MED ORDER — SODIUM CHLORIDE 0.9 % IJ SOLN
INTRAMUSCULAR | Status: AC
  Filled 2014-02-20: qty 50

## 2014-02-20 MED ORDER — MIDAZOLAM HCL 5 MG/5ML IJ SOLN
INTRAMUSCULAR | Status: DC | PRN
  Administered 2014-02-20 (×2): 0.5 mg via INTRAVENOUS

## 2014-02-20 MED ORDER — ONDANSETRON HCL 4 MG PO TABS: 4.0000 mg | ORAL_TABLET | Freq: Four times a day (QID) | ORAL | Status: DC | PRN

## 2014-02-20 MED ORDER — ONDANSETRON HCL 4 MG/2ML IJ SOLN: 4.0000 mg | Freq: Four times a day (QID) | INTRAMUSCULAR | Status: DC | PRN

## 2014-02-20 MED ORDER — FLEET ENEMA 7-19 GM/118ML RE ENEM: 1.0000 | ENEMA | Freq: Once | RECTAL | Status: AC | PRN

## 2014-02-20 MED ORDER — HYDROMORPHONE HCL 2 MG PO TABS
2.0000 mg | ORAL_TABLET | ORAL | Status: DC | PRN
  Administered 2014-02-21: 4 mg via ORAL
  Administered 2014-02-21: 2 mg via ORAL
  Administered 2014-02-22 – 2014-02-23 (×6): 4 mg via ORAL
  Filled 2014-02-20: qty 1
  Filled 2014-02-20 (×7): qty 2

## 2014-02-20 MED ORDER — BUPIVACAINE HCL (PF) 0.25 % IJ SOLN
INTRAMUSCULAR | Status: AC
  Filled 2014-02-20: qty 30

## 2014-02-20 MED ORDER — TRANEXAMIC ACID 100 MG/ML IV SOLN
1000.0000 mg | INTRAVENOUS | Status: DC
  Filled 2014-02-20: qty 10

## 2014-02-20 MED ORDER — ESMOLOL HCL 10 MG/ML IV SOLN
INTRAVENOUS | Status: AC
  Filled 2014-02-20: qty 10

## 2014-02-20 MED ORDER — GLYCOPYRROLATE 0.2 MG/ML IJ SOLN
INTRAMUSCULAR | Status: DC | PRN
  Administered 2014-02-20: 0.4 mg via INTRAVENOUS

## 2014-02-20 MED ORDER — PROMETHAZINE HCL 25 MG/ML IJ SOLN
INTRAMUSCULAR | Status: AC
Start: 2014-02-20 — End: 2014-02-21
  Filled 2014-02-20: qty 1

## 2014-02-20 MED ORDER — PHENYLEPHRINE 40 MCG/ML (10ML) SYRINGE FOR IV PUSH (FOR BLOOD PRESSURE SUPPORT)
PREFILLED_SYRINGE | INTRAVENOUS | Status: AC
  Filled 2014-02-20: qty 10

## 2014-02-20 MED ORDER — BUPIVACAINE LIPOSOME 1.3 % IJ SUSP
INTRAMUSCULAR | Status: DC | PRN
  Administered 2014-02-20: 20 mL

## 2014-02-20 SURGICAL SUPPLY — 42 items
BAG SPEC THK2 15X12 ZIP CLS (MISCELLANEOUS)
BAG ZIPLOCK 12X15 (MISCELLANEOUS) IMPLANT
BLADE EXTENDED COATED 6.5IN (ELECTRODE) ×3 IMPLANT
BLADE SAG 18X100X1.27 (BLADE) ×3 IMPLANT
CAPT HIP TOTAL 2 ×2 IMPLANT
CLOSURE WOUND 1/2 X4 (GAUZE/BANDAGES/DRESSINGS) ×2
COVER PERINEAL POST (MISCELLANEOUS) ×3 IMPLANT
DECANTER SPIKE VIAL GLASS SM (MISCELLANEOUS) ×3 IMPLANT
DRAPE C-ARM 42X120 X-RAY (DRAPES) ×3 IMPLANT
DRAPE STERI IOBAN 125X83 (DRAPES) ×3 IMPLANT
DRAPE U-SHAPE 47X51 STRL (DRAPES) ×9 IMPLANT
DRSG ADAPTIC 3X8 NADH LF (GAUZE/BANDAGES/DRESSINGS) ×3 IMPLANT
DRSG MEPILEX BORDER 4X4 (GAUZE/BANDAGES/DRESSINGS) ×3 IMPLANT
DRSG MEPILEX BORDER 4X8 (GAUZE/BANDAGES/DRESSINGS) ×3 IMPLANT
DURAPREP 26ML APPLICATOR (WOUND CARE) ×3 IMPLANT
ELECT REM PT RETURN 9FT ADLT (ELECTROSURGICAL) ×3
ELECTRODE REM PT RTRN 9FT ADLT (ELECTROSURGICAL) ×1 IMPLANT
EVACUATOR 1/8 PVC DRAIN (DRAIN) ×3 IMPLANT
FACESHIELD WRAPAROUND (MASK) ×12 IMPLANT
FACESHIELD WRAPAROUND OR TEAM (MASK) ×4 IMPLANT
GLOVE BIO SURGEON STRL SZ7.5 (GLOVE) ×3 IMPLANT
GLOVE BIO SURGEON STRL SZ8 (GLOVE) ×6 IMPLANT
GLOVE BIOGEL PI IND STRL 8 (GLOVE) ×2 IMPLANT
GLOVE BIOGEL PI INDICATOR 8 (GLOVE) ×4
GOWN STRL REUS W/TWL LRG LVL3 (GOWN DISPOSABLE) ×3 IMPLANT
GOWN STRL REUS W/TWL XL LVL3 (GOWN DISPOSABLE) ×3 IMPLANT
KIT BASIN OR (CUSTOM PROCEDURE TRAY) ×3 IMPLANT
NDL SAFETY ECLIPSE 18X1.5 (NEEDLE) ×2 IMPLANT
NEEDLE HYPO 18GX1.5 SHARP (NEEDLE) ×6
PACK TOTAL JOINT (CUSTOM PROCEDURE TRAY) ×3 IMPLANT
PEN SKIN MARKING BROAD (MISCELLANEOUS) ×3 IMPLANT
STRIP CLOSURE SKIN 1/2X4 (GAUZE/BANDAGES/DRESSINGS) ×3 IMPLANT
SUT ETHIBOND NAB CT1 #1 30IN (SUTURE) ×3 IMPLANT
SUT MNCRL AB 4-0 PS2 18 (SUTURE) ×3 IMPLANT
SUT VIC AB 2-0 CT1 27 (SUTURE) ×6
SUT VIC AB 2-0 CT1 TAPERPNT 27 (SUTURE) ×2 IMPLANT
SUT VLOC 180 0 24IN GS25 (SUTURE) ×3 IMPLANT
SYR 20CC LL (SYRINGE) ×3 IMPLANT
SYR 50ML LL SCALE MARK (SYRINGE) ×3 IMPLANT
TOWEL OR 17X26 10 PK STRL BLUE (TOWEL DISPOSABLE) ×3 IMPLANT
TOWEL OR NON WOVEN STRL DISP B (DISPOSABLE) IMPLANT
TRAY FOLEY CATH 14FRSI W/METER (CATHETERS) ×3 IMPLANT

## 2014-02-20 NOTE — Progress Notes (Signed)
Pt's heart rate elevated in OR: EKG done here in PACU shows NSR; Dr. Lissa Hoard notified and will check pt

## 2014-02-20 NOTE — Anesthesia Preprocedure Evaluation (Addendum)
Anesthesia Evaluation  Patient identified by MRN, date of birth, ID band Patient awake    Reviewed: Allergy & Precautions, NPO status , Patient's Chart, lab work & pertinent test results  Airway Mallampati: II  TM Distance: >3 FB Neck ROM: Full    Dental no notable dental hx.    Pulmonary neg pulmonary ROS, former smoker,  breath sounds clear to auscultation  Pulmonary exam normal       Cardiovascular negative cardio ROS  Rhythm:Regular Rate:Normal     Neuro/Psych negative neurological ROS  negative psych ROS   GI/Hepatic negative GI ROS, Neg liver ROS,   Endo/Other  Hypothyroidism   Renal/GU negative Renal ROS     Musculoskeletal  (+) Arthritis -,   Abdominal   Peds  Hematology negative hematology ROS (+)   Anesthesia Other Findings   Reproductive/Obstetrics negative OB ROS                            Anesthesia Physical Anesthesia Plan  ASA: II  Anesthesia Plan: Spinal   Post-op Pain Management:    Induction:   Airway Management Planned:   Additional Equipment:   Intra-op Plan:   Post-operative Plan:   Informed Consent: I have reviewed the patients History and Physical, chart, labs and discussed the procedure including the risks, benefits and alternatives for the proposed anesthesia with the patient or authorized representative who has indicated his/her understanding and acceptance.   Dental advisory given  Plan Discussed with: CRNA  Anesthesia Plan Comments:        Anesthesia Quick Evaluation

## 2014-02-20 NOTE — Progress Notes (Signed)
Dr. Lissa Hoard in to check pt; OK to go to regular med surg room

## 2014-02-20 NOTE — Op Note (Signed)
OPERATIVE REPORT  PREOPERATIVE DIAGNOSIS: Osteoarthritis of the Right hip.   POSTOPERATIVE DIAGNOSIS: Osteoarthritis of the Right  hip.   PROCEDURE: Right total hip arthroplasty, anterior approach.   SURGEON: Gaynelle Arabian, MD   ASSISTANT: Arlee Muslim, PA-C  ANESTHESIA:  General  ESTIMATED BLOOD LOSS:- 300 ml  DRAINS: Hemovac x1.   COMPLICATIONS: None   CONDITION: PACU - hemodynamically stable.   BRIEF CLINICAL NOTE: Virginia Bullock is a 81 y.o. female who has advanced end-  stage arthritis of her Right  hip with progressively worsening pain and  dysfunction.The patient has failed nonoperative management and presents for  total hip arthroplasty.   PROCEDURE IN DETAIL: After successful administration of spinal  anesthetic, the traction boots for the [facility] bed were placed on both  feet and the patient was placed onto the [facility] bed, boots placed into the leg  holders. The Right hip was then isolated from the perineum with plastic  drapes and prepped and draped in the usual sterile fashion. ASIS and  greater trochanter were marked and a oblique incision was made, starting  at about 1 cm lateral and 2 cm distal to the ASIS and coursing towards  the anterior cortex of the femur. The skin was cut with a 10 blade  through subcutaneous tissue to the level of the fascia overlying the  tensor fascia lata muscle. The fascia was then incised in line with the  incision at the junction of the anterior third and posterior 2/3rd. The  muscle was teased off the fascia and then the interval between the TFL  and the rectus was developed. The Hohmann retractor was then placed at  the top of the femoral neck over the capsule. The vessels overlying the  capsule were cauterized and the fat on top of the capsule was removed.  A Hohmann retractor was then placed anterior underneath the rectus  femoris to give exposure to the entire anterior capsule. A T-shaped  capsulotomy was  performed. The edges were tagged and the femoral head  was identified.       Osteophytes are removed off the superior acetabulum.  The femoral neck was then cut in situ with an oscillating saw. Traction  was then applied to the left lower extremity utilizing the [facility]  traction. The femoral head was then removed. Retractors were placed  around the acetabulum and then circumferential removal of the labrum was  performed. Osteophytes were also removed. Reaming starts at 43 mm to  medialize and  Increased in 2 mm increments to 47 mm. We reamed in  approximately 40 degrees of abduction, 20 degrees anteversion. A 48 mm  pinnacle acetabular shell was then impacted in anatomic position under  fluoroscopic guidance with excellent purchase. We did not need to place  any additional dome screws. A 28 mm neutral + 4 marathon liner was then  placed into the acetabular shell.       The femoral lift was then placed along the lateral aspect of the femur  just distal to the vastus ridge. The leg was  externally rotated and capsule  was stripped off the inferior aspect of the femoral neck down to the  level of the lesser trochanter, this was done with electrocautery. The femur was lifted after this was performed. The  leg was then placed and extended in adducted position to essentially delivering the femur. We also removed the capsule superiorly and the  piriformis from the piriformis  fossa to gain excellent exposure of the  proximal femur. Rongeur was used to remove some cancellous bone to get  into the lateral portion of the proximal femur for placement of the  initial starter reamer. The starter broaches was placed  the starter broach  and was shown to go down the center of the canal. Broaching  with the  [place] system was then performed starting at size 8, coursing  Up to size 13. A size 13 had excellent torsional and rotational  and axial stability. The trial standard offset neck was then placed  with a  28 + 1.5 trial head. The hip was then reduced. We confirmed that  the stem was in the canal both on AP and lateral x-rays. It also has excellent sizing. The hip was reduced with outstanding stability through full extension, full external rotation,  and then flexion in adduction internal rotation. AP pelvis was taken  and the leg lengths were measured and found to be exactly equal. Hip  was then dislocated again and the femoral head and neck removed. The  femoral broach was removed. Size 13 [place] stem with a standard offset  neck was then impacted into the femur following native anteversion. Has  excellent purchase in the canal. Excellent torsional and rotational and  axial stability. It is confirmed to be in the canal on AP and lateral  fluoroscopic views. The 28 + 1.5 ceramic head was placed and the hip  reduced with outstanding stability. Again AP pelvis was taken and it  confirmed that the leg lengths were equal. The wound was then copiously  irrigated with saline solution and the capsule reattached and repaired  with Ethibond suture.  20 mL of Exparel mixed with 50 mL of saline then additional 20 ml of .25% Bupivicaine injected into the capsule and into the edge of the tensor fascia lata as well as subcutaneous tissue. The fascia overlying the tensor fascia lata was  then closed with a running #1 V-Loc. Subcu was closed with interrupted  2-0 Vicryl and subcuticular running 4-0 Monocryl. Incision was cleaned  and dried. Steri-Strips and a bulky sterile dressing applied. Hemovac  drain was hooked to suction and then he was awakened and transported to  recovery in stable condition.        Please note that a surgical assistant was a medical necessity for this procedure to perform it in a safe and expeditious manner. Assistant was necessary to provide appropriate retraction of vital neurovascular structures and to prevent femoral fracture and allow for anatomic placement of the  prosthesis.  Gaynelle Arabian, M.D.

## 2014-02-20 NOTE — Transfer of Care (Signed)
Immediate Anesthesia Transfer of Care Note  Patient: Virginia Bullock  Procedure(s) Performed: Procedure(s) (LRB): RIGHT TOTAL HIP ARTHROPLASTY ANTERIOR APPROACH (Right)  Patient Location: PACU  Anesthesia Type: General  Level of Consciousness: sedated, patient cooperative and responds to stimulation  Airway & Oxygen Therapy: Patient Spontanous Breathing and Patient connected to face mask oxgen  Post-op Assessment: Report given to PACU RN and Post -op Vital signs reviewed and stable  Post vital signs: Reviewed and stable  Complications: No apparent anesthesia complications

## 2014-02-20 NOTE — Progress Notes (Signed)
Utilization review completed.  

## 2014-02-20 NOTE — Interval H&P Note (Signed)
History and Physical Interval Note:  02/20/2014 12:50 PM  Virginia Bullock  has presented today for surgery, with the diagnosis of OA OF RIGHT HIP  The various methods of treatment have been discussed with the patient and family. After consideration of risks, benefits and other options for treatment, the patient has consented to  Procedure(s): RIGHT TOTAL HIP ARTHROPLASTY ANTERIOR APPROACH (Right) as a surgical intervention .  The patient's history has been reviewed, patient examined, no change in status, stable for surgery.  I have reviewed the patient's chart and labs.  Questions were answered to the patient's satisfaction.     Gearlean Alf

## 2014-02-20 NOTE — H&P (View-Only) (Signed)
Virginia Bullock DOB: 07-03-1928 Widowed / Language: Undefined / Race: White Female Date of Admission:  02/20/2014 CC:  Right Hip Pain History of Present Illness The patient is a 81 year old female who comes in  for a preoperative History and Physical. The patient is scheduled for a right total hip arthroplasty (anterior approach) to be performed by Dr. [name]. Aluisio, MD at [facility] on 02-20-2014. The patient reports right posterior hip problems including pain, stiffness and locking symptoms that have been present for many month(s). The symptoms began without any known injury. Symptoms reported include weakness, pain with weightbearing, night pain, stiffness and difficulty bearing weight The patient reports symptoms radiating to the: right thigh and right knee. The patient describes the hip problem as aching. Onset of symptoms was gradual.The symptoms are described as severe.The patient feels as if their symptoms are does feel they are worsening. Symptoms are exacerbated by weight bearing, walking and lying on the affected side. Symptoms are relieved by rest and nonsteroidal anti-inflammatory drugs. Current treatment includes nonsteroidal anti-inflammatory drugs. She has had accupuncture and manipulation. She states the chiropractor told her she needed a hip replacement many years ago. She states the pain will even radiate down to her right knee. She has had problems with her hips for years, but unfortunately in the past 3-4 months, it has gotten progressively worse. The pain is throughout the hip lateral and anterior, going into her thigh. She has lost a lot of movement. She is having pain with activity but also now having pain at rest. It is definitely limiting what she can and cannot do. She had been treated by a chiropractor, Dr. [name], and upon initiating treatment, he felt that the problem was from her hip. He subsequently sent her to [facility] for evaluation. She is ready to proceed with  hip replacement. They have been treated conservatively in the past for the above stated problem and despite conservative measures, they continue to have progressive pain and severe functional limitations and dysfunction. They have failed non-operative management including home exercise, medications. It is felt that they would benefit from undergoing total joint replacement. Risks and benefits of the procedure have been discussed with the patient and they elect to proceed with surgery. There are no active contraindications to surgery such as ongoing infection or rapidly progressive neurological disease.  Problem List/Past Medical Primary osteoarthritis of right hip (M16.11) Lactose intolerance (E73.9) Skin Cancer Squamous Cell and Basal Cell Migraine Headache Hypothyroidism Osteopenia Diverticulosis Shingles Impaired Hearing  Allergies Demerol *ANALGESICS - OPIOID* Vomiting. Codeine Sulfate *ANALGESICS - OPIOID* Vomiting. Benadryl *ANTIHISTAMINES* "sends me to the moon"  Family History First Degree Relatives reported Family history unknown - Adopted  Social History  Living situation live alone Marital status widowed Exercise Exercises daily; does other Children 5 or more Current drinker 11/20/2013: Currently drinks wine 5-7 times per week No history of drug/alcohol rehab Tobacco use Former smoker. 11/20/2013: smoke(d) 1 pack(s) per day Not under pain contract Tobacco / smoke exposure 11/20/2013: no  Medication History  Advil (200MG  Capsule, 1 (one) Oral) Active. Vitamin B-12 (1000MCG Tablet, 1 (one) Oral) Active. Vitamin C (500MG  Tablet, 1 (one) Oral) Active. Vitamin D (400UNIT Capsule, 1 (one) Oral) Active. (D3) Potassium Chloride (Oral) Specific dose unknown - Active. Magnesium Gluconate (Oral) Specific dose unknown - Active.   Past Surgical History Neck Disc Surgery Fusion Spinal Fusion neck Mastoidectomy bilateral; age 16 Arthroscopy of  Knee left Hysterectomy Date: 2011. partial (non-cancerous) Straighten Nasal Septum Tonsillectomy Twice -  age 54 and age 37   Review of Systems General Not Present- Chills, Fatigue, Fever, Memory Loss, Night Sweats, Weight Gain and Weight Loss. Skin Not Present- Eczema, Hives, Itching, Lesions and Rash. HEENT Not Present- Dentures, Double Vision, Headache, Hearing Loss, Tinnitus and Visual Loss. Respiratory Not Present- Allergies, Chronic Cough, Coughing up blood, Shortness of breath at rest and Shortness of breath with exertion. Cardiovascular Not Present- Chest Pain, Difficulty Breathing Lying Down, Murmur, Palpitations, Racing/skipping heartbeats and Swelling. Gastrointestinal Not Present- Abdominal Pain, Bloody Stool, Constipation, Diarrhea, Difficulty Swallowing, Heartburn, Jaundice, Loss of appetitie, Nausea and Vomiting. Female Genitourinary Not Present- Blood in Urine, Discharge, Flank Pain, Incontinence, Painful Urination, Urgency, Urinary frequency, Urinary Retention, Urinating at Night and Weak urinary stream. Musculoskeletal Not Present- Back Pain, Joint Pain, Joint Swelling, Morning Stiffness, Muscle Pain, Muscle Weakness and Spasms. Neurological Not Present- Blackout spells, Difficulty with balance, Dizziness, Paralysis, Tremor and Weakness. Psychiatric Not Present- Insomnia.  Vitals Weight: 128 lb Height: 62in Weight was reported by patient. Height was reported by patient. Body Surface Area: 1.58 m Body Mass Index: 23.41 kg/m  BP: 108/62 (Sitting, Left Arm, Standard)   Physical Exam  General Mental Status -Alert, cooperative and good historian. General Appearance-pleasant, Not in acute distress. Orientation-Oriented X3. Build & Nutrition-Well nourished and Well developed.  Head and Neck Head-normocephalic, atraumatic . Neck Global Assessment - supple, no bruit auscultated on the right, no bruit auscultated on the left. Note: usually wears one  hearing aid   Eye Vision-Wears contact lenses. Pupil - Bilateral-Regular and Round. Motion - Bilateral-EOMI.  Chest and Lung Exam Auscultation Breath sounds - clear at anterior chest wall and clear at posterior chest wall. Adventitious sounds - No Adventitious sounds.  Cardiovascular Auscultation Rhythm - Regular rate and rhythm. Heart Sounds - S1 WNL and S2 WNL. Murmurs & Other Heart Sounds - Auscultation of the heart reveals - No Murmurs.  Abdomen Palpation/Percussion Tenderness - Abdomen is non-tender to palpation. Rigidity (guarding) - Abdomen is soft. Auscultation Auscultation of the abdomen reveals - Bowel sounds normal.  Female Genitourinary Note: Not done, not pertinent to present illness   Musculoskeletal Note: On examination, she is alert and oriented in no apparent distress. Her left hip has normal range of motion with no discomfort. The right hip can be flexed to 100 with no internal rotation, about 10 degrees of external rotation, 10-20 degrees of abduction. She is nontender about the hip. Her knee examination is unremarkable. Pulse, sensation and motor are intact distally. She walks with an antalgic gait pattern.  RADIOGRAPHS: Her radiographs AP pelvis and lateral of the right hip show bone-on-bone arthritis in the right hip with subchondral cystic formation. The left hip looks fairly normal.   Assessment & Plan Primary osteoarthritis of right hip (M16.11) Note:Surgical Plans: Right Total Hip Repalcement - Anterior Approach  Disposition: [place]  PCP: Dr. Harlan Stains - Patient has been seen preoperatively and felt to be stable for surgery. Please contact the [facility] if assistance is needed at the time of surgery.  IV TXA  Anesthesia Issues: Would prefer a general anesthesia  Signed electronically by Joelene Millin, III PA-C

## 2014-02-20 NOTE — Anesthesia Postprocedure Evaluation (Signed)
Anesthesia Post Note  Patient: Virginia Bullock  Procedure(s) Performed: Procedure(s) (LRB): RIGHT TOTAL HIP ARTHROPLASTY ANTERIOR APPROACH (Right)  Anesthesia type: General  Patient location: PACU  Post pain: Pain level controlled  Post assessment: Post-op Vital signs reviewed  Last Vitals: BP 130/55 mmHg  Pulse 78  Temp(Src) 36.4 C (Oral)  Resp 15  Ht 5\' 2"  (1.575 m)  Wt 133 lb (60.328 kg)  BMI 24.32 kg/m2  SpO2 99%  Post vital signs: Reviewed  Level of consciousness: sedated  Complications: No apparent anesthesia complications

## 2014-02-21 LAB — BASIC METABOLIC PANEL
ANION GAP: 8 (ref 5–15)
BUN: 12 mg/dL (ref 6–23)
CALCIUM: 8.1 mg/dL — AB (ref 8.4–10.5)
CO2: 23 mmol/L (ref 19–32)
Chloride: 102 mmol/L (ref 96–112)
Creatinine, Ser: 0.69 mg/dL (ref 0.50–1.10)
GFR calc non Af Amer: 78 mL/min — ABNORMAL LOW (ref >90)
Glucose, Bld: 154 mg/dL — ABNORMAL HIGH (ref 70–99)
POTASSIUM: 4 mmol/L (ref 3.5–5.1)
Sodium: 133 mmol/L — ABNORMAL LOW (ref 135–145)

## 2014-02-21 LAB — CBC
HCT: 31.1 % — ABNORMAL LOW (ref 36.0–46.0)
HEMOGLOBIN: 10.3 g/dL — AB (ref 12.0–15.0)
MCH: 31.4 pg (ref 26.0–34.0)
MCHC: 33.1 g/dL (ref 30.0–36.0)
MCV: 94.8 fL (ref 78.0–100.0)
Platelets: 182 10*3/uL (ref 150–400)
RBC: 3.28 MIL/uL — ABNORMAL LOW (ref 3.87–5.11)
RDW: 13.1 % (ref 11.5–15.5)
WBC: 9.6 10*3/uL (ref 4.0–10.5)

## 2014-02-21 NOTE — Evaluation (Signed)
Physical Therapy Evaluation Patient Details Name: Virginia Bullock MRN: [id number] DOB: August 17, 1928 Today's Date: 02/21/2014   History of Present Illness  R THA direct anterior  Clinical Impression  Pt s/p R THR presents with decreased R LE strength/ROM and post op pain limiting functional mobility.  Pt would benefit from follow up rehab at SNF level to maximize IND and safety prior to return home with ltd assist.    Follow Up Recommendations SNF    Equipment Recommendations  None recommended by PT    Recommendations for Other Services OT consult     Precautions / Restrictions Precautions Precautions: Fall Restrictions Weight Bearing Restrictions: No RLE Weight Bearing: Weight bearing as tolerated      Mobility  Bed Mobility Overal bed mobility: Needs Assistance Bed Mobility: Supine to Sit     Supine to sit: Min guard;HOB elevated     General bed mobility comments: OOB wtih OT  Transfers Overall transfer level: Needs assistance Equipment used: Rolling walker (2 wheeled) Transfers: Sit to/from Stand Sit to Stand: Min assist         General transfer comment: VCs for safe hand placement  Ambulation/Gait Ambulation/Gait assistance: Min assist;Mod assist;+2 physical assistance;+2 safety/equipment Ambulation Distance (Feet): 80 Feet Assistive device: Rolling walker (2 wheeled) Gait Pattern/deviations: Step-to pattern;Decreased step length - right;Decreased step length - left;Shuffle;Trunk flexed Gait velocity: decr   General Gait Details: cues for posture, position from RW and initial sequence.  1 episode balance loss with ambulation  Stairs            Wheelchair Mobility    Modified Rankin (Stroke Patients Only)       Balance Overall balance assessment: Needs assistance Sitting-balance support: Feet supported Sitting balance-Leahy Scale: Good     Standing balance support: Bilateral upper extremity supported Standing balance-Leahy Scale: Poor                                Pertinent Vitals/Pain Pain Assessment: 0-10 Pain Score: 3  Pain Location: R hip Pain Descriptors / Indicators: Aching;Sore Pain Intervention(s): Limited activity within patient's tolerance;Monitored during session;Premedicated before session;Ice applied    Home Living Family/patient expects to be discharged to:: Skilled nursing facility Living Arrangements: Alone                    Prior Function Level of Independence: Independent               Hand Dominance        Extremity/Trunk Assessment   Upper Extremity Assessment: Overall WFL for tasks assessed           Lower Extremity Assessment: RLE deficits/detail RLE Deficits / Details: 2+/5 hip strength with AAROM at hip to 80 flex and 15 abd       Communication   Communication: No difficulties  Cognition Arousal/Alertness: Awake/alert Behavior During Therapy: WFL for tasks assessed/performed Overall Cognitive Status: Within Functional Limits for tasks assessed                      General Comments      Exercises Total Joint Exercises Ankle Circles/Pumps: AROM;Both;10 reps;Supine Quad Sets: AROM;Both;10 reps;Supine Heel Slides: AAROM;Right;15 reps;Supine Hip ABduction/ADduction: AAROM;Right;10 reps;Supine Long Arc Quad: AROM;AAROM;Right;Both;Seated      Assessment/Plan    PT Assessment Patient needs continued PT services  PT Diagnosis Difficulty walking   PT Problem List Decreased strength;Decreased range of motion;Decreased activity tolerance;Decreased  mobility;Decreased knowledge of use of DME;Pain;Decreased safety awareness  PT Treatment Interventions DME instruction;Gait training;Functional mobility training;Therapeutic activities;Therapeutic exercise;Patient/family education   PT Goals (Current goals can be found in the Care Plan section) Acute Rehab PT Goals Patient Stated Goal: to rehab at [facility] then home PT Goal Formulation: With  patient Time For Goal Achievement: 02/28/14 Potential to Achieve Goals: Good    Frequency 7X/week   Barriers to discharge        Co-evaluation   Reason for Co-Treatment: For patient/therapist safety   OT goals addressed during session: ADL's and self-care;Strengthening/ROM       End of Session Equipment Utilized During Treatment: Gait belt Activity Tolerance: Patient tolerated treatment well Patient left: in chair;with call bell/phone within reach Nurse Communication: Mobility status         Time: 1200-1238 PT Time Calculation (min) (ACUTE ONLY): 38 min   Charges:   PT Evaluation $Initial PT Evaluation Tier I: 1 Procedure PT Treatments $Gait Training: 8-22 mins $Therapeutic Exercise: 8-22 mins   PT G Codes:        Armen Waring March 21, 2014, 2:22 PM

## 2014-02-21 NOTE — Progress Notes (Signed)
Physical Therapy Treatment Patient Details Name: Virginia Bullock MRN: [id number] DOB: [birth year] Today's Date: 03/09/14    History of Present Illness R THA direct anterior    PT Comments    Pt motivated and progressing with mobility but with increased cueing for walker safety.  Follow Up Recommendations  SNF     Equipment Recommendations  None recommended by PT    Recommendations for Other Services OT consult     Precautions / Restrictions Precautions Precautions: Fall Restrictions Weight Bearing Restrictions: No RLE Weight Bearing: Weight bearing as tolerated    Mobility  Bed Mobility                  Transfers Overall transfer level: Needs assistance Equipment used: Rolling walker (2 wheeled) Transfers: Sit to/from Stand Sit to Stand: Min assist         General transfer comment: VCs for safe hand placement  Ambulation/Gait Ambulation/Gait assistance: Independent Ambulation Distance (Feet): 150 Feet (150 twice and to bathroom) Assistive device: Rolling walker (2 wheeled) Gait Pattern/deviations: Step-to pattern;Step-through pattern;Decreased step length - right;Decreased step length - left;Shuffle;Trunk flexed Gait velocity: decr   General Gait Details: cues for posture, position from RW and initial sequence.    Stairs            Wheelchair Mobility    Modified Rankin (Stroke Patients Only)       Balance                                    Cognition Arousal/Alertness: Awake/alert Behavior During Therapy: WFL for tasks assessed/performed Overall Cognitive Status: Within Functional Limits for tasks assessed                      Exercises      General Comments        Pertinent Vitals/Pain Pain Assessment: 0-10 Pain Score: 3  Pain Location: R hip/thigh Pain Descriptors / Indicators: Aching;Sore Pain Intervention(s): Limited activity within patient's tolerance;Monitored during session;Premedicated  before session;Ice applied    Home Living                      Prior Function            PT Goals (current goals can now be found in the care plan section) Acute Rehab PT Goals Patient Stated Goal: to rehab at [facility] then home PT Goal Formulation: With patient Time For Goal Achievement: 02/28/14 Potential to Achieve Goals: Good Progress towards PT goals: Progressing toward goals    Frequency  7X/week    PT Plan Current plan remains appropriate    Co-evaluation             End of Session Equipment Utilized During Treatment: Gait belt Activity Tolerance: Patient tolerated treatment well Patient left: in chair;with call bell/phone within reach     Time: 9323-5573 PT Time Calculation (min) (ACUTE ONLY): 32 min  Charges:  $Gait Training: 23-37 mins                    G Codes:      Erica Osuna 03-09-14, 5:07 PM

## 2014-02-21 NOTE — Discharge Instructions (Addendum)
Dr. Gaynelle Arabian Total Joint Specialist [facility] [address] [place],  [address] [phone]    ANTERIOR APPROACH TOTAL HIP REPLACEMENT POSTOPERATIVE DIRECTIONS   Hip Rehabilitation, Guidelines Following Surgery  The results of a hip operation are greatly improved after range of motion and muscle strengthening exercises. Follow all safety measures which are given to protect your hip. If any of these exercises cause increased pain or swelling in your joint, decrease the amount until you are comfortable again. Then slowly increase the exercises. Call your caregiver if you have problems or questions.  HOME CARE INSTRUCTIONS  Most of the following instructions are designed to prevent the dislocation of your new hip.  Remove items at home which could result in a fall. This includes throw rugs or furniture in walking pathways.  Continue medications as instructed at time of discharge.  You may have some home medications which will be placed on hold until you complete the course of blood thinner medication.  You may start showering once you are discharged home but do not submerge the incision under water. Just pat the incision dry and apply a dry gauze dressing on daily. Do not put on socks or shoes without following the instructions of your caregivers.  Sit on high chairs which makes it easier to stand.  Sit on chairs with arms. Use the chair arms to help push yourself up when arising.  Keep your leg on the side of the operation out in front of you when standing up.  Arrange for the use of a toilet seat elevator so you are not sitting low.    Walk with walker as instructed.  You may resume a sexual relationship in one month or when given the OK by your caregiver.  Use walker as long as suggested by your caregivers.  You may put full weight on your legs and walk as much as is comfortable. Avoid periods of inactivity such as sitting longer than an hour  when not asleep. This helps prevent blood clots.  You may return to work once you are cleared by Engineer, production.  Do not drive a car for 6 weeks or until released by your surgeon.  Do not drive while taking narcotics.  Wear elastic stockings for three weeks following surgery during the day but you may remove then at night.  Make sure you keep all of your appointments after your operation with all of your doctors and caregivers. You should call the office at the above phone number and make an appointment for approximately two weeks after the date of your surgery. Change the dressing daily and reapply a dry dressing each time. Please pick up a stool softener and laxative for home use as long as you are requiring pain medications.  ICE to the affected hip every three hours for 30 minutes at a time and then as needed for pain and swelling.  Continue to use ice on the hip for pain and swelling from surgery. You may notice swelling that will progress down to the foot and ankle.  This is normal after surgery.  Elevate the leg when you are not up walking on it.   It is important for you to complete the blood thinner medication as prescribed by your doctor.  Continue to use the breathing machine which will help keep your temperature down.  It is common for your temperature to cycle up and down following surgery, especially at night when you are not up moving around  and exerting yourself.  The breathing machine keeps your lungs expanded and your temperature down.  RANGE OF MOTION AND STRENGTHENING EXERCISES  These exercises are designed to help you keep full movement of your hip joint. Follow your caregiver's or physical therapist's instructions. Perform all exercises about fifteen times, three times per day or as directed. Exercise both hips, even if you have had only one joint replacement. These exercises can be done on a training (exercise) mat, on the floor, on a table or on a bed. Use whatever works the best  and is most comfortable for you. Use music or television while you are exercising so that the exercises are a pleasant break in your day. This will make your life better with the exercises acting as a break in routine you can look forward to.  Lying on your back, slowly slide your foot toward your buttocks, raising your knee up off the floor. Then slowly slide your foot back down until your leg is straight again.  Lying on your back spread your legs as far apart as you can without causing discomfort.  Lying on your side, raise your upper leg and foot straight up from the floor as far as is comfortable. Slowly lower the leg and repeat.  Lying on your back, tighten up the muscle in the front of your thigh (quadriceps muscles). You can do this by keeping your leg straight and trying to raise your heel off the floor. This helps strengthen the largest muscle supporting your knee.  Lying on your back, tighten up the muscles of your buttocks both with the legs straight and with the knee bent at a comfortable angle while keeping your heel on the floor.   SKILLED REHAB INSTRUCTIONS: If the patient is transferred to a skilled rehab facility following release from the hospital, a list of the current medications will be sent to the facility for the patient to continue.  When discharged from the skilled rehab facility, please have the facility set up the patient's [place] prior to being released. Also, the skilled facility will be responsible for providing the patient with their medications at time of release from the facility to include their pain medication, the muscle relaxants, and their blood thinner medication. If the patient is still at the rehab facility at time of the two week follow up appointment, the skilled rehab facility will also need to assist the patient in arranging follow up appointment in our office and any transportation needs.  MAKE SURE YOU:  Understand these instructions.   Will watch your condition.  Will get help right away if you are not doing well or get worse.  Pick up stool softner and laxative for home use following surgery while on pain medications. Do not submerge incision under water. Please use good hand washing techniques while changing dressing each day. May shower starting three days after surgery. Please use a clean towel to pat the incision dry following showers. Continue to use ice for pain and swelling after surgery. Do not use any lotions or creams on the incision until instructed by your surgeon. Total Hip Protocol.  Take Xarelto for two and a half more weeks, then discontinue Xarelto. Once the patient has completed the blood thinner regimen, then take a Baby 81 mg Aspirin daily for three more weeks.  Postoperative Constipation Protocol  Constipation - defined medically as fewer than three stools per week and severe constipation as less than one stool per week.  One  of the most common issues patients have following surgery is constipation.  Even if you have a regular bowel pattern at home, your normal regimen is likely to be disrupted due to multiple reasons following surgery.  Combination of anesthesia, postoperative narcotics, change in appetite and fluid intake all can affect your bowels.  In order to avoid complications following surgery, here are some recommendations in order to help you during your recovery period.  Colace (docusate) - Pick up an over-the-counter form of Colace or another stool softener and take twice a day as long as you are requiring postoperative pain medications.  Take with a full glass of water daily.  If you experience loose stools or diarrhea, hold the colace until you stool forms back up.  If your symptoms do not get better within 1 week or if they get worse, check with your doctor.  Dulcolax (bisacodyl) - Pick up over-the-counter and take as directed by the product packaging as needed to assist with the  movement of your bowels.  Take with a full glass of water.  Use this product as needed if not relieved by Colace only.   MiraLax (polyethylene glycol) - Pick up over-the-counter to have on hand.  MiraLax is a solution that will increase the amount of water in your bowels to assist with bowel movements.  Take as directed and can mix with a glass of water, juice, soda, coffee, or tea.  Take if you go more than two days without a movement. Do not use MiraLax more than once per day. Call your doctor if you are still constipated or irregular after using this medication for 7 days in a row.  If you continue to have problems with postoperative constipation, please contact the office for further assistance and recommendations.  If you experience "the worst abdominal pain ever" or develop nausea or vomiting, please contact the office immediatly for further recommendations for treatment.  When discharged from the skilled rehab facility, please have the facility set up the patient's [place] prior to being released.   Also provide the patient with their medications at time of release from the facility to include their pain medication, the muscle relaxants, and their blood thinner medication.  If the patient is still at the rehab facility at time of follow up appointment, please also assist the patient in arranging follow up appointment in our office and any transportation needs. ICE to the affected knee or hip every three hours for 30 minutes at a time and then as needed for pain and swelling.    Information on my medicine - XARELTO (Rivaroxaban)  This medication education was reviewed with me or my healthcare representative as part of my discharge preparation.  The pharmacist that spoke with me during my hospital stay was:  Angela [facility]  Why was Xarelto prescribed for you? Xarelto was prescribed for you to reduce the risk of blood clots forming after orthopedic surgery. The  medical term for these abnormal blood clots is venous thromboembolism (VTE).  What do you need to know about xarelto ? Take your Xarelto ONCE DAILY at the same time every day. You may take it either with or without food.  If you have difficulty swallowing the tablet whole, you may crush it and mix in applesauce just prior to taking your dose.  Take Xarelto exactly as prescribed by your doctor and DO NOT stop taking Xarelto without talking to the doctor who prescribed the medication.  Stopping without  other VTE prevention medication to take the place of Xarelto may increase your risk of developing a clot.  After discharge, you should have regular check-up appointments with your healthcare provider that is prescribing your Xarelto.    What do you do if you miss a dose? If you miss a dose, take it as soon as you remember on the same day then continue your regularly scheduled once daily regimen the next day. Do not take two doses of Xarelto on the same day.   Important Safety Information A possible side effect of Xarelto is bleeding. You should call your healthcare provider right away if you experience any of the following: ? Bleeding from an injury or your nose that does not stop. ? Unusual colored urine (red or dark brown) or unusual colored stools (red or black). ? Unusual bruising for unknown reasons. ? A serious fall or if you hit your head (even if there is no bleeding).  Some medicines may interact with Xarelto and might increase your risk of bleeding while on Xarelto. To help avoid this, consult your healthcare provider or pharmacist prior to using any new prescription or non-prescription medications, including herbals, vitamins, non-steroidal anti-inflammatory drugs (NSAIDs) and supplements.  This website has more information on Xarelto: [email or web address]

## 2014-02-21 NOTE — Progress Notes (Signed)
Clinical Social Work Department CLINICAL SOCIAL WORK PLACEMENT NOTE 02/21/2014  Patient:  Virginia Bullock, Virginia Bullock  Account Number:  [id number] [place] date:  02/20/2014  Clinical Social Worker:  Sindy Messing, LCSW  Date/time:  02/21/2014 10:30 AM  Clinical Social Work is seeking post-discharge placement for this patient at the following level of care:   [place] [facility]   (*CSW will update this form in Epic as items are completed)   02/21/2014  Patient/family provided with [place] Department of Clinical Social Work's list of facilities offering this level of care within the geographic area requested by the patient (or if unable, by the patient's family).  02/21/2014  Patient/family informed of their freedom to choose among providers that offer the needed level of care, that participate in Medicare, Medicaid or managed care program needed by the patient, have an available bed and are willing to accept the patient.  02/21/2014  Patient/family informed of [facility]' ownership interest in [facility], as well as of the fact that they are under no obligation to receive care at this facility.  PASARR submitted to EDS on 02/21/2014 PASARR number received on 02/21/2014  FL2 transmitted to all facilities in geographic area requested by pt/family on  02/21/2014 FL2 transmitted to all facilities within larger geographic area on   Patient informed that his/her managed care company has contracts with or will negotiate with  certain facilities, including the following:     Patient/family informed of bed offers received:  02/21/2014 Patient chooses bed at [facility] Physician recommends and patient chooses bed at    Patient to be transferred to  on   Patient to be transferred to facility by  Patient and family notified of transfer on  Name of family member notified:    The following physician request were entered in Epic:   Additional Comments:

## 2014-02-21 NOTE — Clinical Social Work Psychosocial (Signed)
Clinical Social Work Department BRIEF PSYCHOSOCIAL ASSESSMENT 02/21/2014  Patient:  Virginia Bullock, Virginia Bullock     Account Number:  [id number]     [place] date:  02/20/2014  Clinical Social Worker:  Dede Query, CLINICAL SOCIAL WORKER  Date/Time:  02/21/2014 10:28 AM  Referred by:  Physician  Date Referred:  02/21/2014 Referred for  SNF Placement   Other Referral:   Interview type:  Patient Other interview type:    PSYCHOSOCIAL DATA Living Status:  ALONE Admitted from facility:   Level of care:   Primary support name:  Joe Primary support relationship to patient:  CHILD, ADULT Degree of support available:   medium    CURRENT CONCERNS  Other Concerns:    SOCIAL WORK ASSESSMENT / PLAN CSW provided information to pt regarding roles and responsibilities  CSW prompted pt to discuss SNF choice and to confirm that she had pre-registered for [employer]. 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.  Pt stated that she did plan on going to [facility] at discharge. Pt  was worried about the snow on Monday and was hoping she would be discharged before the snow came in.    Pt anxious about wearing her hospital clothes around PT staff stated that she was going to put some clothes on before meeting with he PT therapist.    .Dede Query, [place] Social Worker - Weekend Coverage cell #: [phone]

## 2014-02-21 NOTE — Plan of Care (Signed)
Problem: Consults Goal: Diagnosis- Total Joint Replacement Outcome: Completed/Met Date Met:  02/21/14 Primary Total Hip RIGHT, Anterior

## 2014-02-21 NOTE — Progress Notes (Signed)
Subjective: 1 Day Post-Op Procedure(s) (LRB): RIGHT TOTAL HIP ARTHROPLASTY ANTERIOR APPROACH (Right) Patient reports pain as mild.  No c/o.  Doing well with PT.  Objective: Vital signs in last 24 hours: Temp:  [97 F (36.1 C)-98.2 F (36.8 C)] 97.4 F (36.3 C) (02/13 0717) Pulse Rate:  [67-80] 67 (02/13 0717) Resp:  [13-23] 16 (02/13 0717) BP: (97-131)/(49-77) 103/66 mmHg (02/13 0717) SpO2:  [94 %-100 %] 97 % (02/13 0717) Weight:  [60.328 kg (133 lb)] 60.328 kg (133 lb) (02/12 1035)  Intake/Output from previous day: 02/12 0701 - 02/13 0700 In: 1932.5 [P.O.:360; I.V.:1472.5; IV Piggyback:100] Out: 1630 [Urine:1200; Drains:130; Blood:300] Intake/Output this shift: Total I/O In: -  Out: 1450 [Urine:1400; Drains:50]   Recent Labs  02/21/14 0500  HGB 10.3*    Recent Labs  02/21/14 0500  WBC 9.6  RBC 3.28*  HCT 31.1*  PLT 182    Recent Labs  02/21/14 0500  NA 133*  K 4.0  CL 102  CO2 23  BUN 12  CREATININE 0.69  GLUCOSE 154*  CALCIUM 8.1*   No results for input(s): LABPT, INR in the last 72 hours.  PE:  wn wd woman in nad.  Hip incision dressed and dry.  NVI at [facility].  Assessment/Plan: 1 Day Post-Op Procedure(s) (LRB): RIGHT TOTAL HIP ARTHROPLASTY ANTERIOR APPROACH (Right) Up with therapy  Continue post op care.  Drain removed.  Likely snf Monday.  Wylene Simmer 02/21/2014, 9:41 AM

## 2014-02-21 NOTE — Progress Notes (Signed)
CARE MANAGEMENT NOTE 02/21/2014  Patient:  Virginia Bullock, Virginia Bullock   Account Number:  [id number]  Date Initiated:  02/21/2014  Documentation initiated by:  [facility]  Subjective/Objective Assessment:   RIGHT TOTAL HIP ARTHROPLASTY ANTERIOR APPROACH     Action/Plan:   Anticipated DC Date:  02/23/2014   Anticipated DC Plan:  SKILLED NURSING FACILITY  In-house referral  Clinical Social Worker      DC Planning Services  CM consult      Choice offered to / List presented to:             Status of service:  Completed, signed off Medicare Important Message given?   (If response is "NO", the following Medicare IM given date fields will be blank) Date Medicare IM given:   Medicare IM given by:   Date Additional Medicare IM given:   Additional Medicare IM given by:    Discharge Disposition:  [place]  Per UR Regulation:    If discussed at Long Length of Stay Meetings, dates discussed:    Comments:  02/21/2014 1030 NCM spoke to pt and plan is for SNF for rehab. CSW following for SNF placement. Jonnie Finner RN CCM Case Mgmt phone [phone]

## 2014-02-21 NOTE — Evaluation (Addendum)
Occupational Therapy Evaluation and Discharge Patient Details Name: Virginia Bullock MRN: [id number] DOB: [birth year] Today's Date: 02/21/2014    History of Present Illness R THA direct anterior   Clinical Impression   This 81 yo female admitted and underwent above presents to acute OT with decreased mobility, decreased balance,  decreased ROM of RLE, all affecting her ability to take care of herself at home at an independent to Mod I level. She will benefit from continued OT at SNF, acute OT will sign off.    Follow Up Recommendations  SNF    Equipment Recommendations   (TBD at next venue)       Precautions / Restrictions Precautions Precautions: Fall Restrictions RLE Weight Bearing: Weight bearing as tolerated      Mobility Bed Mobility Overal bed mobility: Needs Assistance Bed Mobility: Supine to Sit     Supine to sit: Min guard;HOB elevated        Transfers Overall transfer level: Needs assistance Equipment used: Rolling walker (2 wheeled) Transfers: Sit to/from Stand Sit to Stand: Min assist         General transfer comment: VCs for safe hand placement    Balance Overall balance assessment: Needs assistance Sitting-balance support: Feet supported;Bilateral upper extremity supported Sitting balance-Leahy Scale: Good     Standing balance support: Bilateral upper extremity supported Standing balance-Leahy Scale: Poor                              ADL Overall ADL's : Needs assistance/impaired Eating/Feeding: Independent;Sitting   Grooming: Set up;Sitting   Upper Body Bathing: Set up;Sitting   Lower Body Bathing: Moderate assistance (with min A sit<>stand)   Upper Body Dressing : Set up;Sitting   Lower Body Dressing: Moderate assistance (with min A sit<>stand)   Toilet Transfer: Minimal assistance;Ambulation;RW (bed>recliner)   Toileting- Clothing Manipulation and Hygiene: Minimal assistance;Sit to/from stand                          Pertinent Vitals/Pain Pain Assessment: No/denies pain Pain Location: right hip Pain Descriptors / Indicators: Sore;Tightness Pain Intervention(s): Monitored during session;Repositioned;Ice applied     Hand Dominance  Right   Extremity/Trunk Assessment Upper Extremity Assessment Upper Extremity Assessment: Overall WFL for tasks assessed   Lower Extremity Assessment Lower Extremity Assessment: Defer to PT evaluation          Cognition Arousal/Alertness: Awake/alert Behavior During Therapy: WFL for tasks assessed/performed Overall Cognitive Status: Within Functional Limits for tasks assessed                                Home Living Family/patient expects to be discharged to:: Skilled nursing facility [facility]) Living Arrangements: Alone                                           OT Diagnosis: Generalized weakness   OT Problem List: Decreased strength;Decreased range of motion;Decreased activity tolerance;Impaired balance (sitting and/or standing);Pain;Decreased knowledge of use of DME or AE      OT Goals(Current goals can be found in the care plan section) Acute Rehab OT Goals Patient Stated Goal: to rehab at [facility] then home  OT Frequency:  End of Session Equipment Utilized During Treatment: Gait belt;Rolling walker Nurse Communication: Mobility status (NT)  Activity Tolerance: Patient tolerated treatment well Patient left: in chair;with call bell/phone within reach   Time: 1008-1040 OT Time Calculation (min): 32 min Charges:  OT General Charges $OT Visit: 1 Procedure OT Evaluation $Initial OT Evaluation Tier I: 1 Procedure OT Treatments $Self Care/Home Management : 8-22 mins  Almon Register [phone] 02/21/2014, 11:22 AM

## 2014-02-22 LAB — CBC
HCT: 30.2 % — ABNORMAL LOW (ref 36.0–46.0)
Hemoglobin: 10.1 g/dL — ABNORMAL LOW (ref 12.0–15.0)
MCH: 32.1 pg (ref 26.0–34.0)
MCHC: 33.4 g/dL (ref 30.0–36.0)
MCV: 95.9 fL (ref 78.0–100.0)
PLATELETS: 171 10*3/uL (ref 150–400)
RBC: 3.15 MIL/uL — ABNORMAL LOW (ref 3.87–5.11)
RDW: 13.6 % (ref 11.5–15.5)
WBC: 16.1 10*3/uL — AB (ref 4.0–10.5)

## 2014-02-22 LAB — BASIC METABOLIC PANEL
ANION GAP: 5 (ref 5–15)
BUN: 16 mg/dL (ref 6–23)
CHLORIDE: 109 mmol/L (ref 96–112)
CO2: 25 mmol/L (ref 19–32)
CREATININE: 0.56 mg/dL (ref 0.50–1.10)
Calcium: 8.6 mg/dL (ref 8.4–10.5)
GFR, EST NON AFRICAN AMERICAN: 84 mL/min — AB (ref >90)
Glucose, Bld: 128 mg/dL — ABNORMAL HIGH (ref 70–99)
Potassium: 4.1 mmol/L (ref 3.5–5.1)
Sodium: 139 mmol/L (ref 135–145)

## 2014-02-22 NOTE — Plan of Care (Signed)
Problem: Phase III Progression Outcomes Goal: Anticoagulant follow-up in place Outcome: Not Applicable Date Met:  73/56/70 Xarelto VTE, no f/u needed.

## 2014-02-22 NOTE — Progress Notes (Signed)
   Subjective: 2 Days Post-Op Procedure(s) (LRB): RIGHT TOTAL HIP ARTHROPLASTY ANTERIOR APPROACH (Right) Patient reports pain as mild.   Patient seen in rounds with Dr. Wynelle Link. Patient is well, but has had some minor complaints of pain in the hip, requiring pain medications We will start therapy today.  Plan is to go Skilled nursing facility after hospital stay.  Objective: Vital signs in last 24 hours: Temp:  [98 F (36.7 C)-99.2 F (37.3 C)] 99.2 F (37.3 C) (02/14 0610) Pulse Rate:  [70-74] 74 (02/14 0610) Resp:  [16-20] 20 (02/14 0610) BP: (102-124)/(53-63) 124/53 mmHg (02/14 0610) SpO2:  [94 %-99 %] 98 % (02/14 0610)  Intake/Output from previous day:  Intake/Output Summary (Last 24 hours) at 02/22/14 0825 Last data filed at 02/22/14 0700  Gross per 24 hour  Intake 1578.75 ml  Output   3900 ml  Net -2321.25 ml    Labs:  Recent Labs  02/21/14 0500 02/22/14 0530  HGB 10.3* 10.1*    Recent Labs  02/21/14 0500 02/22/14 0530  WBC 9.6 16.1*  RBC 3.28* 3.15*  HCT 31.1* 30.2*  PLT 182 171    Recent Labs  02/21/14 0500 02/22/14 0530  NA 133* 139  K 4.0 4.1  CL 102 109  CO2 23 25  BUN 12 16  CREATININE 0.69 0.56  GLUCOSE 154* 128*  CALCIUM 8.1* 8.6   No results for input(s): LABPT, INR in the last 72 hours.  EXAM General - Patient is Alert and Appropriate Extremity - Neurovascular intact Sensation intact distally Intact pulses distally Dressing - dressing C/D/I Motor Function - intact, moving foot and toes well on exam.   Past Medical History  Diagnosis Date  . History of shingles     x2  . Post-nasal drip   . Hypothyroidism   . Complication of anesthesia     "epidural with 5th baby-stopped breathing and went numb, 'died'"  . Arthritis     right hip  . Basal cell carcinoma of skin     Assessment/Plan: 2 Days Post-Op Procedure(s) (LRB): RIGHT TOTAL HIP ARTHROPLASTY ANTERIOR APPROACH (Right) Principal Problem:   OA (osteoarthritis) of  hip  Estimated body mass index is 24.32 kg/(m^2) as calculated from the following:   Height as of this encounter: 5\' 2"  (1.575 m).   Weight as of this encounter: 60.328 kg (133 lb). Up with therapy Plan for discharge tomorrow Discharge to SNF  DVT Prophylaxis - Xarelto Weight Bearing As Tolerated right Leg   Arlee Muslim, PA-C Orthopaedic Surgery 02/22/2014, 8:25 AM

## 2014-02-22 NOTE — Progress Notes (Signed)
Physical Therapy Treatment Patient Details Name: Virginia Bullock MRN: [id number] DOB: [birth year] Today's Date: 02/22/2014    History of Present Illness R THA direct anterior    PT Comments    Pt cooperative and pleasant but ltd this am by elevated pain with increased time and multiple rests required to complete tasks.  Follow Up Recommendations  SNF     Equipment Recommendations  None recommended by PT    Recommendations for Other Services OT consult     Precautions / Restrictions Precautions Precautions: Fall Restrictions Weight Bearing Restrictions: No RLE Weight Bearing: Weight bearing as tolerated    Mobility  Bed Mobility Overal bed mobility: Needs Assistance Bed Mobility: Supine to Sit     Supine to sit: Min assist     General bed mobility comments: cues for sequence and use of L LE to self assist  Transfers Overall transfer level: Needs assistance Equipment used: Rolling walker (2 wheeled) Transfers: Sit to/from Stand Sit to Stand: Min assist         General transfer comment: VCs for safe hand placement  Ambulation/Gait Ambulation/Gait assistance: Min assist Ambulation Distance (Feet): 111 Feet Assistive device: Rolling walker (2 wheeled) Gait Pattern/deviations: Step-to pattern;Step-through pattern;Decreased step length - right;Decreased step length - left;Shuffle;Trunk flexed;Antalgic Gait velocity: decr   General Gait Details: cues for posture, position from RW and initial sequence.    Stairs            Wheelchair Mobility    Modified Rankin (Stroke Patients Only)       Balance                                    Cognition Arousal/Alertness: Awake/alert Behavior During Therapy: WFL for tasks assessed/performed Overall Cognitive Status: Within Functional Limits for tasks assessed                      Exercises Total Joint Exercises Ankle Circles/Pumps: AROM;Both;Supine;20 reps Quad Sets:  AROM;Both;10 reps;Supine Heel Slides: AAROM;Right;Supine;20 reps Hip ABduction/ADduction: AAROM;Right;Supine;15 reps    General Comments        Pertinent Vitals/Pain Pain Assessment: 0-10 Pain Score: 6  Pain Location: R hip/thigh Pain Descriptors / Indicators: Aching;Spasm;Sore Pain Intervention(s): Limited activity within patient's tolerance;Monitored during session;Ice applied;Patient requesting pain meds-RN notified    Home Living                      Prior Function            PT Goals (current goals can now be found in the care plan section) Acute Rehab PT Goals Patient Stated Goal: to rehab at [facility] then home PT Goal Formulation: With patient Time For Goal Achievement: 02/28/14 Potential to Achieve Goals: Good Progress towards PT goals: Progressing toward goals    Frequency  7X/week    PT Plan Current plan remains appropriate    Co-evaluation             End of Session Equipment Utilized During Treatment: Gait belt Activity Tolerance: Patient tolerated treatment well Patient left: in chair;with call bell/phone within reach     Time: 1142-1220 PT Time Calculation (min) (ACUTE ONLY): 38 min  Charges:  $Gait Training: 8-22 mins $Therapeutic Exercise: 8-22 mins $Therapeutic Activity: 8-22 mins                    G Codes:  Virginia Bullock 02/22/2014, 1:31 PM

## 2014-02-22 NOTE — Progress Notes (Signed)
Physical Therapy Treatment Patient Details Name: Virginia Bullock MRN: [id number] DOB: 1928-01-19 Today's Date: 2014/02/25    History of Present Illness R THA direct anterior    PT Comments      Follow Up Recommendations  SNF     Equipment Recommendations  None recommended by PT    Recommendations for Other Services OT consult     Precautions / Restrictions Precautions Precautions: Fall Restrictions Weight Bearing Restrictions: No RLE Weight Bearing: Weight bearing as tolerated    Mobility  Bed Mobility               General bed mobility comments: cues for sequence and use of L LE to self assist  Transfers Overall transfer level: Needs assistance Equipment used: Rolling walker (2 wheeled) Transfers: Sit to/from Stand Sit to Stand: Min assist         General transfer comment: VCs for safe hand placement  Ambulation/Gait Ambulation/Gait assistance: Min assist Ambulation Distance (Feet): 75 Feet (twice) Assistive device: Rolling walker (2 wheeled) Gait Pattern/deviations: Step-to pattern;Step-through pattern;Decreased step length - right;Decreased step length - left;Shuffle;Trunk flexed;Antalgic Gait velocity: decr   General Gait Details: cues for posture, position from RW and initial sequence.    Stairs            Wheelchair Mobility    Modified Rankin (Stroke Patients Only)       Balance                                    Cognition Arousal/Alertness: Awake/alert Behavior During Therapy: WFL for tasks assessed/performed Overall Cognitive Status: Within Functional Limits for tasks assessed                      Exercises      General Comments        Pertinent Vitals/Pain Pain Assessment: 0-10 Pain Score: 6  Pain Location: R hip/thigh Pain Descriptors / Indicators: Aching;Sore Pain Intervention(s): Limited activity within patient's tolerance;Monitored during session;Premedicated before session;Ice  applied    Home Living                      Prior Function            PT Goals (current goals can now be found in the care plan section) Acute Rehab PT Goals Patient Stated Goal: to rehab at [facility] then home PT Goal Formulation: With patient Time For Goal Achievement: 02/28/14 Potential to Achieve Goals: Good Progress towards PT goals: Progressing toward goals    Frequency  7X/week    PT Plan Current plan remains appropriate    Co-evaluation             End of Session Equipment Utilized During Treatment: Gait belt Activity Tolerance: Patient tolerated treatment well Patient left: in chair;with call bell/phone within reach     Time: 1425-1450 PT Time Calculation (min) (ACUTE ONLY): 25 min  Charges:  $Gait Training: 23-37 mins                    G Codes:      Virginia Bullock Feb 25, 2014, 3:52 PM

## 2014-02-23 LAB — CBC
HCT: 31.1 % — ABNORMAL LOW (ref 36.0–46.0)
Hemoglobin: 10.1 g/dL — ABNORMAL LOW (ref 12.0–15.0)
MCH: 31.6 pg (ref 26.0–34.0)
MCHC: 32.5 g/dL (ref 30.0–36.0)
MCV: 97.2 fL (ref 78.0–100.0)
Platelets: 178 10*3/uL (ref 150–400)
RBC: 3.2 MIL/uL — AB (ref 3.87–5.11)
RDW: 13.7 % (ref 11.5–15.5)
WBC: 11.8 10*3/uL — ABNORMAL HIGH (ref 4.0–10.5)

## 2014-02-23 MED ORDER — BISACODYL 10 MG RE SUPP: 10.0000 mg | Freq: Every day | RECTAL | Status: DC | PRN

## 2014-02-23 MED ORDER — RIVAROXABAN 10 MG PO TABS: 10.0000 mg | ORAL_TABLET | Freq: Every day | ORAL | Status: DC

## 2014-02-23 MED ORDER — TRAMADOL HCL 50 MG PO TABS: 50.0000 mg | ORAL_TABLET | Freq: Four times a day (QID) | ORAL | Status: DC | PRN

## 2014-02-23 MED ORDER — DOCUSATE SODIUM 100 MG PO CAPS: 100.0000 mg | ORAL_CAPSULE | Freq: Two times a day (BID) | ORAL | Status: DC

## 2014-02-23 MED ORDER — HYDROMORPHONE HCL 2 MG PO TABS: 2.0000 mg | ORAL_TABLET | ORAL | Status: DC | PRN

## 2014-02-23 MED ORDER — METHOCARBAMOL 500 MG PO TABS: 500.0000 mg | ORAL_TABLET | Freq: Four times a day (QID) | ORAL | Status: DC | PRN

## 2014-02-23 MED ORDER — ONDANSETRON HCL 4 MG PO TABS: 4.0000 mg | ORAL_TABLET | Freq: Four times a day (QID) | ORAL | Status: DC | PRN

## 2014-02-23 MED ORDER — POLYETHYLENE GLYCOL 3350 17 G PO PACK: 17.0000 g | PACK | Freq: Every day | ORAL | Status: DC | PRN

## 2014-02-23 MED ORDER — METOCLOPRAMIDE HCL 5 MG PO TABS: 5.0000 mg | ORAL_TABLET | Freq: Three times a day (TID) | ORAL | Status: DC | PRN

## 2014-02-23 NOTE — Discharge Summary (Signed)
Physician Discharge Summary   Patient ID: Virginia Bullock MRN: [id number] DOB/AGE: 79/16/1930 79 y.o.  Admit date: 02/20/2014 Discharge date: 02-23-2014  Primary Diagnosis:  Osteoarthritis of the Right hip.  Admission Diagnoses:  Past Medical History  Diagnosis Date  . History of shingles     x2  . Post-nasal drip   . Hypothyroidism   . Complication of anesthesia     "epidural with 5th baby-stopped breathing and went numb, 'died'"  . Arthritis     right hip  . Basal cell carcinoma of skin    Discharge Diagnoses:   Principal Problem:   OA (osteoarthritis) of hip  Estimated body mass index is 24.32 kg/(m^2) as calculated from the following:   Height as of this encounter: _0  (1.575 m).   Weight as of this encounter: 60.328 kg (133 lb).  Procedure(s) (LRB): RIGHT TOTAL HIP ARTHROPLASTY ANTERIOR APPROACH (Right)   Consults: None  HPI: Virginia Bullock is a 81 y.o. female who has advanced end-  stage arthritis of her Right hip with progressively worsening pain and  dysfunction.The patient has failed nonoperative management and presents for  total hip arthroplasty.  Laboratory Data: Admission on 02/20/2014  Component Date Value Ref Range Status  . ABO/RH(D) 02/20/2014 O POS   Final  . Antibody Screen 02/20/2014 NEG   Final  . Sample Expiration 02/20/2014 02/23/2014   Final  . ABO/RH(D) 02/20/2014 O POS   Final  . WBC 02/21/2014 9.6  4.0 - 10.5 K/uL Final  . RBC 02/21/2014 3.28* 3.87 - 5.11 MIL/uL Final  . Hemoglobin 02/21/2014 10.3* 12.0 - 15.0 g/dL Final  . HCT 02/21/2014 31.1* 36.0 - 46.0 % Final  . MCV 02/21/2014 94.8  78.0 - 100.0 fL Final  . MCH 02/21/2014 31.4  26.0 - 34.0 pg Final  . MCHC 02/21/2014 33.1  30.0 - 36.0 g/dL Final  . RDW 02/21/2014 13.1  11.5 - 15.5 % Final  . Platelets 02/21/2014 182  150 - 400 K/uL Final  . Sodium 02/21/2014 133* 135 - 145 mmol/L Final  . Potassium 02/21/2014 4.0  3.5 - 5.1 mmol/L Final  . Chloride 02/21/2014 102  96 -  112 mmol/L Final  . CO2 02/21/2014 23  19 - 32 mmol/L Final  . Glucose, Bld 02/21/2014 154* 70 - 99 mg/dL Final  . BUN 02/21/2014 12  6 - 23 mg/dL Final  . Creatinine, Ser 02/21/2014 0.69  0.50 - 1.10 mg/dL Final  . Calcium 02/21/2014 8.1* 8.4 - 10.5 mg/dL Final  . GFR calc non Af Amer 02/21/2014 78* >90 mL/min Final  . GFR calc Af Amer 02/21/2014 >90  >90 mL/min Final   Comment: (NOTE) The eGFR has been calculated using the CKD EPI equation. This calculation has not been validated in all clinical situations. eGFR's persistently <90 mL/min signify possible Chronic Kidney Disease.   . Anion gap 02/21/2014 8  5 - 15 Final  . WBC 02/22/2014 16.1* 4.0 - 10.5 K/uL Final  . RBC 02/22/2014 3.15* 3.87 - 5.11 MIL/uL Final  . Hemoglobin 02/22/2014 10.1* 12.0 - 15.0 g/dL Final  . HCT 02/22/2014 30.2* 36.0 - 46.0 % Final  . MCV 02/22/2014 95.9  78.0 - 100.0 fL Final  . MCH 02/22/2014 32.1  26.0 - 34.0 pg Final  . MCHC 02/22/2014 33.4  30.0 - 36.0 g/dL Final  . RDW 02/22/2014 13.6  11.5 - 15.5 % Final  . Platelets 02/22/2014 171  150 - 400 K/uL Final  . Sodium 02/22/2014  139  135 - 145 mmol/L Final   REPEATED TO VERIFY  . Potassium 02/22/2014 4.1  3.5 - 5.1 mmol/L Final  . Chloride 02/22/2014 109  96 - 112 mmol/L Final  . CO2 02/22/2014 25  19 - 32 mmol/L Final  . Glucose, Bld 02/22/2014 128* 70 - 99 mg/dL Final  . BUN 02/22/2014 16  6 - 23 mg/dL Final  . Creatinine, Ser 02/22/2014 0.56  0.50 - 1.10 mg/dL Final  . Calcium 02/22/2014 8.6  8.4 - 10.5 mg/dL Final  . GFR calc non Af Amer 02/22/2014 84* >90 mL/min Final  . GFR calc Af Amer 02/22/2014 >90  >90 mL/min Final   Comment: (NOTE) The eGFR has been calculated using the CKD EPI equation. This calculation has not been validated in all clinical situations. eGFR's persistently <90 mL/min signify possible Chronic Kidney Disease.   . Anion gap 02/22/2014 5  5 - 15 Final  . WBC 02/23/2014 11.8* 4.0 - 10.5 K/uL Final  . RBC 02/23/2014 3.20*  3.87 - 5.11 MIL/uL Final  . Hemoglobin 02/23/2014 10.1* 12.0 - 15.0 g/dL Final  . HCT 02/23/2014 31.1* 36.0 - 46.0 % Final  . MCV 02/23/2014 97.2  78.0 - 100.0 fL Final  . MCH 02/23/2014 31.6  26.0 - 34.0 pg Final  . MCHC 02/23/2014 32.5  30.0 - 36.0 g/dL Final  . RDW 02/23/2014 13.7  11.5 - 15.5 % Final  . Platelets 02/23/2014 178  150 - 400 K/uL Final  Hospital Outpatient Visit on 02/16/2014  Component Date Value Ref Range Status  . aPTT 02/16/2014 31  24 - 37 seconds Final  . WBC 02/16/2014 7.7  4.0 - 10.5 K/uL Final  . RBC 02/16/2014 4.30  3.87 - 5.11 MIL/uL Final  . Hemoglobin 02/16/2014 13.5  12.0 - 15.0 g/dL Final  . HCT 02/16/2014 41.5  36.0 - 46.0 % Final  . MCV 02/16/2014 96.5  78.0 - 100.0 fL Final  . MCH 02/16/2014 31.4  26.0 - 34.0 pg Final  . MCHC 02/16/2014 32.5  30.0 - 36.0 g/dL Final  . RDW 02/16/2014 13.4  11.5 - 15.5 % Final  . Platelets 02/16/2014 226  150 - 400 K/uL Final  . Sodium 02/16/2014 140  135 - 145 mmol/L Final  . Potassium 02/16/2014 3.9  3.5 - 5.1 mmol/L Final  . Chloride 02/16/2014 106  96 - 112 mmol/L Final  . CO2 02/16/2014 26  19 - 32 mmol/L Final  . Glucose, Bld 02/16/2014 88  70 - 99 mg/dL Final  . BUN 02/16/2014 22  6 - 23 mg/dL Final  . Creatinine, Ser 02/16/2014 0.62  0.50 - 1.10 mg/dL Final  . Calcium 02/16/2014 9.0  8.4 - 10.5 mg/dL Final  . Total Protein 02/16/2014 7.1  6.0 - 8.3 g/dL Final  . Albumin 02/16/2014 4.0  3.5 - 5.2 g/dL Final  . AST 02/16/2014 27  0 - 37 U/L Final  . ALT 02/16/2014 17  0 - 35 U/L Final  . Alkaline Phosphatase 02/16/2014 85  39 - 117 U/L Final  . Total Bilirubin 02/16/2014 0.8  0.3 - 1.2 mg/dL Final  . GFR calc non Af Amer 02/16/2014 81* >90 mL/min Final  . GFR calc Af Amer 02/16/2014 >90  >90 mL/min Final   Comment: (NOTE) The eGFR has been calculated using the CKD EPI equation. This calculation has not been validated in all clinical situations. eGFR's persistently <90 mL/min signify possible Chronic  Kidney Disease.   . Anion gap  02/16/2014 8  5 - 15 Final  . Prothrombin Time 02/16/2014 14.0  11.6 - 15.2 seconds Final  . INR 02/16/2014 1.07  0.00 - 1.49 Final  . Color, Urine 02/16/2014 YELLOW  YELLOW Final  . APPearance 02/16/2014 CLOUDY* CLEAR Final  . Specific Gravity, Urine 02/16/2014 1.010  1.005 - 1.030 Final  . pH 02/16/2014 7.5  5.0 - 8.0 Final  . Glucose, UA 02/16/2014 NEGATIVE  NEGATIVE mg/dL Final  . Hgb urine dipstick 02/16/2014 NEGATIVE  NEGATIVE Final  . Bilirubin Urine 02/16/2014 NEGATIVE  NEGATIVE Final  . Ketones, ur 02/16/2014 NEGATIVE  NEGATIVE mg/dL Final  . Protein, ur 02/16/2014 NEGATIVE  NEGATIVE mg/dL Final  . Urobilinogen, UA 02/16/2014 0.2  0.0 - 1.0 mg/dL Final  . Nitrite 02/16/2014 NEGATIVE  NEGATIVE Final  . Leukocytes, UA 02/16/2014 MODERATE* NEGATIVE Final  . MRSA, PCR 02/16/2014 NEGATIVE  NEGATIVE Final  . Staphylococcus aureus 02/16/2014 NEGATIVE  NEGATIVE Final   Comment:        The Xpert SA Assay (FDA approved for NASAL specimens in patients over 57 years of age), is one component of a comprehensive surveillance program.  Test performance has been validated by [facility] for patients greater than or equal to 31 year old. It is not intended to diagnose infection nor to guide or monitor treatment.   . Squamous Epithelial / LPF 02/16/2014 FEW* RARE Final  . WBC, UA 02/16/2014 3-6  <3 WBC/hpf Final  . Bacteria, UA 02/16/2014 FEW* RARE Final     X-Rays:Dg Pelvis Portable  02/20/2014   CLINICAL DATA:  Patient status post right hip replacement.  EXAM: DG C-ARM 1-60 MIN - NRPT [facility]; PORTABLE PELVIS 1-2 VIEWS  COMPARISON:  None.  FINDINGS: Patient status post total right hip arthroplasty. Hardware appears intact. There is a drain projecting over the soft tissues. Irregular lucency within the right acetabular region is nonspecific however may be secondary to cystic change from prior degenerative changes. Mild left hip joint degenerative change.   IMPRESSION: Status post right hip arthroplasty.  Minimal lucency within the region of the inferior right acetabulum may be secondary to cystic change from prior degenerative changes. Recommend attention on followup.   Electronically Signed   By: Lovey Newcomer M.D.   On: 02/20/2014 16:42   Dg C-arm 1-60 Min-no Report  02/20/2014   CLINICAL DATA: Right hip replacement   C-ARM 1-60 MINUTES  Fluoroscopy was utilized by the requesting physician.  No radiographic  interpretation.     EKG:No orders found for this or any previous visit.   Hospital Course: Patient was admitted to [facility] and taken to the OR and underwent the above state procedure without complications.  Patient tolerated the procedure well and was later transferred to the recovery room and then to the orthopaedic floor for postoperative care.  They were given PO and IV analgesics for pain control following their surgery.  They were given 24 hours of postoperative antibiotics of  Anti-infectives    Start     Dose/Rate Route Frequency Ordered Stop   02/20/14 2000  ceFAZolin (ANCEF) IVPB 2 g/50 mL premix     2 g 100 mL/hr over 30 Minutes Intravenous Every 6 hours 02/20/14 1803 02/21/14 0347   02/20/14 1032  ceFAZolin (ANCEF) IVPB 2 g/50 mL premix     2 g 100 mL/hr over 30 Minutes Intravenous On call to O.R. 02/20/14 1032 02/20/14 1420     and started on DVT prophylaxis in the form of Xarelto.  PT and OT were ordered for total hip protocol.  The patient was allowed to be WBAT with therapy. Discharge planning was consulted to help with postop disposition and equipment needs.  Patient had a decent night on the evening of surgery.  They started to get up OOB with therapy on day one.  Hemovac drain was pulled without difficulty.  Continued to work with therapy into day two.  Dressing was changed on day two and the incision was healing well.  By day three, the patient had progressed with therapy and meeting their goals.  Incision was  healing well.  Patient was seen in rounds and was ready to go to the SNF - [facility].  Discharge to SNF Diet - Regular diet Follow up - in 2 weeks Activity - WBAT Disposition - Skilled nursing facility Condition Upon Discharge - Good D/C Meds - See DC Summary DVT Prophylaxis - Xarelto      Discharge Instructions    Call MD / Call 911    Complete by:  As directed   If you experience chest pain or shortness of breath, CALL 911 and be transported to the hospital emergency room.  If you develope a fever above 101 F, pus (white drainage) or increased drainage or redness at the wound, or calf pain, call your surgeon's office.     Change dressing    Complete by:  As directed   You may change your dressing dressing daily with sterile 4 x 4 inch gauze dressing and paper tape.  Do not submerge the incision under water.     Constipation Prevention    Complete by:  As directed   Drink plenty of fluids.  Prune juice may be helpful.  You may use a stool softener, such as Colace (over the counter) 100 mg twice a day.  Use MiraLax (over the counter) for constipation as needed.     Diet general    Complete by:  As directed      Discharge instructions    Complete by:  As directed   Pick up stool softner and laxative for home use following surgery while on pain medications. Do not submerge incision under water. Please use good hand washing techniques while changing dressing each day. May shower starting three days after surgery. Please use a clean towel to pat the incision dry following showers. Continue to use ice for pain and swelling after surgery. Do not use any lotions or creams on the incision until instructed by your surgeon.  Total Hip Protocol.  Take Xarelto for two and a half more weeks, then discontinue Xarelto. Once the patient has completed the blood thinner regimen, then take a Baby 81 mg Aspirin daily for three more weeks.  Postoperative Constipation Protocol  Constipation -  defined medically as fewer than three stools per week and severe constipation as less than one stool per week.  One of the most common issues patients have following surgery is constipation.  Even if you have a regular bowel pattern at home, your normal regimen is likely to be disrupted due to multiple reasons following surgery.  Combination of anesthesia, postoperative narcotics, change in appetite and fluid intake all can affect your bowels.  In order to avoid complications following surgery, here are some recommendations in order to help you during your recovery period.  Colace (docusate) - Pick up an over-the-counter form of Colace or another stool softener and take twice a day as long as you are requiring postoperative pain  medications.  Take with a full glass of water daily.  If you experience loose stools or diarrhea, hold the colace until you stool forms back up.  If your symptoms do not get better within 1 week or if they get worse, check with your doctor.  Dulcolax (bisacodyl) - Pick up over-the-counter and take as directed by the product packaging as needed to assist with the movement of your bowels.  Take with a full glass of water.  Use this product as needed if not relieved by Colace only.   MiraLax (polyethylene glycol) - Pick up over-the-counter to have on hand.  MiraLax is a solution that will increase the amount of water in your bowels to assist with bowel movements.  Take as directed and can mix with a glass of water, juice, soda, coffee, or tea.  Take if you go more than two days without a movement. Do not use MiraLax more than once per day. Call your doctor if you are still constipated or irregular after using this medication for 7 days in a row.  If you continue to have problems with postoperative constipation, please contact the office for further assistance and recommendations.  If you experience "the worst abdominal pain ever" or develop nausea or vomiting, please contact the  office immediatly for further recommendations for treatment.  When discharged from the skilled rehab facility, please have the facility set up the patient's [place] prior to being released.   Also provide the patient with their medications at time of release from the facility to include their pain medication, the muscle relaxants, and their blood thinner medication.  If the patient is still at the rehab facility at time of follow up appointment, please also assist the patient in arranging follow up appointment in our office and any transportation needs. ICE to the affected knee or hip every three hours for 30 minutes at a time and then as needed for pain and swelling.     Do not sit on low chairs, stoools or toilet seats, as it may be difficult to get up from low surfaces    Complete by:  As directed      Driving restrictions    Complete by:  As directed   No driving until released by the physician.     Increase activity slowly as tolerated    Complete by:  As directed      Lifting restrictions    Complete by:  As directed   No lifting until released by the physician.     Patient may shower    Complete by:  As directed   You may shower without a dressing once there is no drainage.  Do not wash over the wound.  If drainage remains, do not shower until drainage stops.     TED hose    Complete by:  As directed   Use stockings (TED hose) for 3 weeks on both leg(s).  You may remove them at night for sleeping.     Weight bearing as tolerated    Complete by:  As directed   Laterality:  right  Extremity:  Lower            Medication List    STOP taking these medications        cholecalciferol 1000 UNITS tablet  Commonly known as:  VITAMIN D     ibuprofen 200 MG tablet  Commonly known as:  ADVIL,MOTRIN     LYSINE PO  OVER THE COUNTER MEDICATION     VITAMIN C PO      TAKE these medications        acetaminophen 325 MG tablet  Commonly known as:  TYLENOL    Take 650 mg by mouth every 6 (six) hours as needed.     bisacodyl 10 MG suppository  Commonly known as:  DULCOLAX  Place 1 suppository (10 mg total) rectally daily as needed for moderate constipation.     docusate sodium 100 MG capsule  Commonly known as:  COLACE  Take 1 capsule (100 mg total) by mouth 2 (two) times daily.     HYDROmorphone 2 MG tablet  Commonly known as:  DILAUDID  Take 1-2 tablets (2-4 mg total) by mouth every 4 (four) hours as needed for moderate pain or severe pain.     magnesium 30 MG tablet  Take 30 mg by mouth daily.     methocarbamol 500 MG tablet  Commonly known as:  ROBAXIN  Take 1 tablet (500 mg total) by mouth every 6 (six) hours as needed for muscle spasms.     metoCLOPramide 5 MG tablet  Commonly known as:  REGLAN  Take 1 tablet (5 mg total) by mouth every 8 (eight) hours as needed for nausea (if ondansetron (ZOFRAN) ineffective.).     ondansetron 4 MG tablet  Commonly known as:  ZOFRAN  Take 1 tablet (4 mg total) by mouth every 6 (six) hours as needed for nausea.     polyethylene glycol packet  Commonly known as:  MIRALAX / GLYCOLAX  Take 17 g by mouth daily as needed for mild constipation.     potassium chloride 10 MEQ tablet  Commonly known as:  K-DUR,KLOR-CON  Take 10 mEq by mouth daily.     rivaroxaban 10 MG Tabs tablet  Commonly known as:  XARELTO  - Take 1 tablet (10 mg total) by mouth daily with breakfast. Take Xarelto for two and a half more weeks, then discontinue Xarelto.  - Once the patient has completed the blood thinner regimen, then take a Baby 81 mg Aspirin daily for three more weeks.     traMADol 50 MG tablet  Commonly known as:  ULTRAM  Take 1-2 tablets (50-100 mg total) by mouth every 6 (six) hours as needed (mild pain).       Follow-up Information    Follow up with Gearlean Alf, MD. Schedule an appointment as soon as possible for a visit on 03/05/2014.   Specialty:  Orthopedic Surgery   Why:  Call office ASAP  at [phone] to set up appointment with Dr. Wynelle Link.   Contact information:   [address] [place] [address] [phone]       Signed: Arlee Muslim, PA-C Orthopaedic Surgery 02/23/2014, 7:05 AM

## 2014-02-23 NOTE — Progress Notes (Signed)
Physical Therapy Treatment Patient Details Name: Virginia Bullock MRN: [id number] DOB: 1928/08/15 Today's Date: 22-Mar-2014    History of Present Illness R THA direct anterior    PT Comments    Progressing, to SNF  For continued rehab today hopefully  Follow Up Recommendations  SNF     Equipment Recommendations  None recommended by PT    Recommendations for Other Services OT consult     Precautions / Restrictions Precautions Precautions: Fall Restrictions Weight Bearing Restrictions: No RLE Weight Bearing: Weight bearing as tolerated    Mobility  Bed Mobility                  Transfers Overall transfer level: Needs assistance Equipment used: Rolling walker (2 wheeled) Transfers: Sit to/from Stand Sit to Stand: Min guard         General transfer comment: VCs for safe hand placement  Ambulation/Gait Ambulation/Gait assistance: Min guard Ambulation Distance (Feet): 15 Feet (x2) Assistive device: Rolling walker (2 wheeled) Gait Pattern/deviations: Step-to pattern;Antalgic;Trunk flexed;Decreased step length - right;Decreased step length - left Gait velocity: decr   General Gait Details: cues for posture, position from RW and initial sequence.    Stairs            Wheelchair Mobility    Modified Rankin (Stroke Patients Only)       Balance           Standing balance support: During functional activity;No upper extremity supported;Single extremity supported Standing balance-Leahy Scale: Poor                      Cognition Arousal/Alertness: Awake/alert Behavior During Therapy: WFL for tasks assessed/performed Overall Cognitive Status: Within Functional Limits for tasks assessed                      Exercises Total Joint Exercises Ankle Circles/Pumps: AROM;Both;Supine;20 reps Quad Sets: AROM;Both;10 reps;Supine Short Arc Quad: AROM;Right;10 reps;Strengthening Heel Slides: AAROM;Right;Supine;10 reps Hip  ABduction/ADduction: AAROM;Right;Supine;15 reps    General Comments General comments (skin integrity, edema, etc.): pt with LOB x 1 (psoteriorly with min to recover) during toileting/clothing manipulation      Pertinent Vitals/Pain Pain Assessment: 0-10 Pain Score: 3  Pain Location: right hip Pain Descriptors / Indicators: Tightness;Sore;Spasm Pain Intervention(s): Limited activity within patient's tolerance;Ice applied;Monitored during session    Home Living                      Prior Function            PT Goals (current goals can now be found in the care plan section) Acute Rehab PT Goals Patient Stated Goal: to rehab at [facility] then home PT Goal Formulation: With patient Time For Goal Achievement: 02/28/14 Potential to Achieve Goals: Good Progress towards PT goals: Progressing toward goals    Frequency  7X/week    PT Plan Current plan remains appropriate    Co-evaluation             End of Session   Activity Tolerance: Patient tolerated treatment well Patient left: in chair;with call bell/phone within reach     Time: 1410-1442 PT Time Calculation (min) (ACUTE ONLY): 32 min  Charges:  $Gait Training: 8-22 mins $Therapeutic Exercise: 8-22 mins                    G Codes:      Amandeep Nesmith 03-22-14, 2:48 PM

## 2014-02-23 NOTE — Progress Notes (Signed)
Clinical Social Work Department CLINICAL SOCIAL WORK PLACEMENT NOTE 02/23/2014  Patient:  Virginia, Bullock  Account Number:  [id number] [place] date:  02/20/2014  Clinical Social Worker:  Sindy Messing, LCSW  Date/time:  02/21/2014 10:30 AM  Clinical Social Work is seeking post-discharge placement for this patient at the following level of care:   [place]   (*CSW will update this form in Epic as items are completed)   02/21/2014  Patient/family provided with [place] Department of Clinical Social Work's list of facilities offering this level of care within the geographic area requested by the patient (or if unable, by the patient's family).  02/21/2014  Patient/family informed of their freedom to choose among providers that offer the needed level of care, that participate in Medicare, Medicaid or managed care program needed by the patient, have an available bed and are willing to accept the patient.  02/21/2014  Patient/family informed of [facility]' ownership interest in [facility], as well as of the fact that they are under no obligation to receive care at this facility.  PASARR submitted to EDS on 02/21/2014 PASARR number received on 02/21/2014  FL2 transmitted to all facilities in geographic area requested by pt/family on  02/21/2014 FL2 transmitted to all facilities within larger geographic area on   Patient informed that his/her managed care company has contracts with or will negotiate with  certain facilities, including the following:     Patient/family informed of bed offers received:  02/21/2014 Patient chooses bed at [place] Physician recommends and patient chooses bed at    Patient to be transferred to [place] on  02/23/2014 Patient to be transferred to facility by [facility] Patient and family notified of transfer on 02/23/2014 Name of family member notified:  SON  The following physician request were entered in Epic:   Additional  Comments: Pt / family are in agreement with d/c to SNF today. [facility] transport required. NSG reviewed d/c summary, avs, scripts. Scripts included in d/c packet.  Werner Lean LCSW [phone]

## 2014-02-23 NOTE — Progress Notes (Signed)
   Subjective: 3 Days Post-Op Procedure(s) (LRB): RIGHT TOTAL HIP ARTHROPLASTY ANTERIOR APPROACH (Right) Patient reports pain as mild.   Patient seen in rounds by Dr. Wynelle Link. Patient is well, and has had no acute complaints or problems Patient is ready to go to [facility]  Objective: Vital signs in last 24 hours: Temp:  [98.2 F (36.8 C)-100.1 F (37.8 C)] 98.9 F (37.2 C) (02/15 0510) Pulse Rate:  [83-87] 83 (02/15 0510) Resp:  [18-20] 18 (02/15 0510) BP: (103-112)/(58-62) 112/60 mmHg (02/15 0510) SpO2:  [97 %-98 %] 97 % (02/15 0510)  Intake/Output from previous day:  Intake/Output Summary (Last 24 hours) at 02/23/14 0657 Last data filed at 02/22/14 2000  Gross per 24 hour  Intake    960 ml  Output   1700 ml  Net   -740 ml    Intake/Output this shift: Total I/O In: 240 [P.O.:240] Out: -   Labs:  Recent Labs  02/21/14 0500 02/22/14 0530 02/23/14 0503  HGB 10.3* 10.1* 10.1*    Recent Labs  02/22/14 0530 02/23/14 0503  WBC 16.1* 11.8*  RBC 3.15* 3.20*  HCT 30.2* 31.1*  PLT 171 178    Recent Labs  02/21/14 0500 02/22/14 0530  NA 133* 139  K 4.0 4.1  CL 102 109  CO2 23 25  BUN 12 16  CREATININE 0.69 0.56  GLUCOSE 154* 128*  CALCIUM 8.1* 8.6   No results for input(s): LABPT, INR in the last 72 hours.  EXAM: General - Patient is Alert, Appropriate and Oriented Extremity - Neurovascular intact Sensation intact distally Dorsiflexion/Plantar flexion intact Incision - clean, dry Motor Function - intact, moving foot and toes well on exam.   Assessment/Plan: 3 Days Post-Op Procedure(s) (LRB): RIGHT TOTAL HIP ARTHROPLASTY ANTERIOR APPROACH (Right) Procedure(s) (LRB): RIGHT TOTAL HIP ARTHROPLASTY ANTERIOR APPROACH (Right) Past Medical History  Diagnosis Date  . History of shingles     x2  . Post-nasal drip   . Hypothyroidism   . Complication of anesthesia     "epidural with 5th baby-stopped breathing and went numb, 'died'"  . Arthritis       right hip  . Basal cell carcinoma of skin    Principal Problem:   OA (osteoarthritis) of hip  Estimated body mass index is 24.32 kg/(m^2) as calculated from the following:   Height as of this encounter: 5\' 2"  (1.575 m).   Weight as of this encounter: 60.328 kg (133 lb). Up with therapy Discharge to SNF Diet - Regular diet Follow up - in 2 weeks Activity - WBAT Disposition - Skilled nursing facility Condition Upon Discharge - Good D/C Meds - See DC Summary DVT Prophylaxis - Xarelto  Arlee Muslim, PA-C Orthopaedic Surgery 02/23/2014, 6:57 AM

## 2014-02-24 ENCOUNTER — Encounter (HOSPITAL_COMMUNITY): Payer: Self-pay | Admitting: Orthopedic Surgery

## 2014-02-27 ENCOUNTER — Non-Acute Institutional Stay (SKILLED_NURSING_FACILITY): Payer: Medicare Other | Admitting: Internal Medicine

## 2014-02-27 ENCOUNTER — Encounter: Payer: Self-pay | Admitting: Internal Medicine

## 2014-02-27 DIAGNOSIS — H9190 Unspecified hearing loss, unspecified ear: Secondary | ICD-10-CM

## 2014-02-27 DIAGNOSIS — E039 Hypothyroidism, unspecified: Secondary | ICD-10-CM | POA: Insufficient documentation

## 2014-02-27 DIAGNOSIS — M8580 Other specified disorders of bone density and structure, unspecified site: Secondary | ICD-10-CM

## 2014-02-27 DIAGNOSIS — G43909 Migraine, unspecified, not intractable, without status migrainosus: Secondary | ICD-10-CM

## 2014-02-27 DIAGNOSIS — K5730 Diverticulosis of large intestine without perforation or abscess without bleeding: Secondary | ICD-10-CM

## 2014-02-27 DIAGNOSIS — Z8619 Personal history of other infectious and parasitic diseases: Secondary | ICD-10-CM

## 2014-02-27 DIAGNOSIS — E739 Lactose intolerance, unspecified: Secondary | ICD-10-CM

## 2014-02-27 DIAGNOSIS — R21 Rash and other nonspecific skin eruption: Secondary | ICD-10-CM | POA: Insufficient documentation

## 2014-02-27 DIAGNOSIS — M1611 Unilateral primary osteoarthritis, right hip: Secondary | ICD-10-CM

## 2014-02-27 HISTORY — DX: Migraine, unspecified, not intractable, without status migrainosus: G43.909

## 2014-02-27 HISTORY — DX: Diverticulosis of large intestine without perforation or abscess without bleeding: K57.30

## 2014-02-27 HISTORY — DX: Other specified disorders of bone density and structure, unspecified site: M85.80

## 2014-02-27 HISTORY — DX: Unspecified hearing loss, unspecified ear: H91.90

## 2014-02-27 HISTORY — DX: Personal history of other infectious and parasitic diseases: Z86.19

## 2014-02-27 NOTE — Progress Notes (Signed)
Patient ID: Virginia Bullock, female   DOB: [birth year], 81 y.o.   MRN: [id number]    HISTORY AND PHYSICAL  Location:  [facility] Room Number: [phone] Place of Service: SNF [phone])   Extended Emergency Contact Information Primary Emergency Contact: Bristow Address:            [place] [address] Johnnette Litter of [place] Phone: [phone] Mobile Phone: [phone] Relation: Son Secondary Emergency Contact: Heath,Kitty Address: [address]          [place], [place] [address] Johnnette Litter of [place] Phone: [phone] Mobile Phone: [phone] Relation: Friend  Advanced Directive information Does patient have an advance directive?: Yes, Type of Advance Directive: Healthcare Power of [place];Living will, Does patient want to make changes to advanced directive?: No - Patient declined  Chief Complaint  Patient presents with  . New Admit To SNF    Following hospitalization for right total hip arthroplasty    HPI:  Primary osteoarthritis of right hip: Right total hip arthroplasty on 02/20/14 by Dr. Wynelle Link. Wound is healing. She is making progress in physical therapy. We anticipate this will be a short-term rehabilitative admission and returned to home.  Rash and nonspecific skin eruption: Etiology is unclear. This does not look like an infection. She is speculating that the presurgical soap may have been part of the problem. She is getting a hydrocortisone cream. The area seems to be healing. Intense pruritus has improved.  Lactose intolerance: Avoid milk products  Hypothyroidism, unspecified hypothyroidism type: Although this was entered as a problem in a pre-existing problem list, she is not on levothyroxine and there is no recent lab work to confirm this.    Past Medical History  Diagnosis Date  . History of shingles     x2  . Post-nasal drip   . Hypothyroidism   . Complication of anesthesia     "epidural with 5th baby-stopped  breathing and went numb, 'died'"  . Arthritis     right hip  . Basal cell carcinoma of skin   . Diverticulosis of colon without hemorrhage 02/27/2014  . Lactose intolerance 02/27/2014  . Loss of hearing 02/27/2014  . Migraine headache 02/27/2014  . OA (osteoarthritis) of hip 02/20/2014  . Osteopenia 02/27/2014  . Rash and nonspecific skin eruption 02/27/2014  . History of herpes zoster 02/27/2014    Past Surgical History  Procedure Laterality Date  . Abdominal hysterectomy  2011  . Basal cell carcinoma excision    . Mastoidectomy Bilateral     as a baby  . Tonsillectomy      x2  . Cervical spine surgery  24 years ago    2 ruptured disc  . Knee arthroscopy Left 30 years ago    "born with knee cap problem"  . Total hip arthroplasty Right 02/20/2014    Procedure: RIGHT TOTAL HIP ARTHROPLASTY ANTERIOR APPROACH;  Surgeon: Gearlean Alf, MD;  Location: [facility];  Service: Orthopedics;  Laterality: Right;    Patient Care Team: Vidal Schwalbe, MD as PCP - General (Family Medicine)  History   Social History  . Marital Status: Widowed    Spouse Name: N/A  . Number of Children: N/A  . Years of Education: N/A   Occupational History  . Not on file.   Social History Main Topics  . Smoking status: Former Smoker    Types: Cigarettes  . Smokeless tobacco: Never Used     Comment: quit 25 years ago  .  Alcohol Use: Yes     Comment: glass of wine daily  . Drug Use: No  . Sexual Activity: Not on file   Other Topics Concern  . Not on file   Social History Narrative     reports that she has quit smoking. Her smoking use included Cigarettes. She has never used smokeless tobacco. She reports that she drinks alcohol. She reports that she does not use illicit drugs.  No family history on file. No family status information on file.     There is no immunization history on file for this patient.  Allergies  Allergen Reactions  . Benadryl [Diphenhydramine Hcl]     Panic attack  symptoms   . Codeine Nausea And Vomiting  . Demerol [Meperidine] Nausea And Vomiting    Medications: Patient's Medications  New Prescriptions   No medications on file  Previous Medications   ACETAMINOPHEN (TYLENOL) 325 MG TABLET    Take 650 mg by mouth every 6 (six) hours as needed.   BISACODYL (DULCOLAX) 10 MG SUPPOSITORY    Place 1 suppository (10 mg total) rectally daily as needed for moderate constipation.   DOCUSATE SODIUM (COLACE) 100 MG CAPSULE    Take 1 capsule (100 mg total) by mouth 2 (two) times daily.   HYDROMORPHONE (DILAUDID) 2 MG TABLET    Take 1-2 tablets (2-4 mg total) by mouth every 4 (four) hours as needed for moderate pain or severe pain.   MAGNESIUM 30 MG TABLET    Take 30 mg by mouth daily.   METHOCARBAMOL (ROBAXIN) 500 MG TABLET    Take 1 tablet (500 mg total) by mouth every 6 (six) hours as needed for muscle spasms.   METOCLOPRAMIDE (REGLAN) 5 MG TABLET    Take 1 tablet (5 mg total) by mouth every 8 (eight) hours as needed for nausea (if ondansetron (ZOFRAN) ineffective.).   ONDANSETRON (ZOFRAN) 4 MG TABLET    Take 1 tablet (4 mg total) by mouth every 6 (six) hours as needed for nausea.   POLYETHYLENE GLYCOL (MIRALAX / GLYCOLAX) PACKET    Take 17 g by mouth daily as needed for mild constipation.   POTASSIUM CHLORIDE (K-DUR,KLOR-CON) 10 MEQ TABLET    Take 10 mEq by mouth daily.   RIVAROXABAN (XARELTO) 10 MG TABS TABLET    Take 1 tablet (10 mg total) by mouth daily with breakfast. Take Xarelto for two and a half more weeks, then discontinue Xarelto. Once the patient has completed the blood thinner regimen, then take a Baby 81 mg Aspirin daily for three more weeks.   TRAMADOL (ULTRAM) 50 MG TABLET    Take 1-2 tablets (50-100 mg total) by mouth every 6 (six) hours as needed (mild pain).  Modified Medications   No medications on file  Discontinued Medications   No medications on file    Review of Systems  Constitutional: Negative for fever, chills, diaphoresis,  activity change, appetite change, fatigue and unexpected weight change.  HENT: Positive for hearing loss. Negative for congestion, ear discharge, ear pain, postnasal drip, rhinorrhea, sore throat, tinnitus, trouble swallowing and voice change.   Eyes: Negative for pain, redness, itching and visual disturbance.  Respiratory: Negative for cough, choking, shortness of breath and wheezing.   Cardiovascular: Positive for leg swelling. Negative for chest pain and palpitations.  Gastrointestinal: Negative for nausea, abdominal pain, diarrhea, constipation and abdominal distention.  Endocrine: Negative for cold intolerance, heat intolerance, polydipsia, polyphagia and polyuria.  Genitourinary: Negative for dysuria, urgency, frequency, hematuria, flank pain,  vaginal discharge, difficulty urinating and pelvic pain.  Musculoskeletal: Negative for myalgias, back pain, arthralgias, gait problem, neck pain and neck stiffness.       Healing scar of the right hip secondary to recent surgery  Skin: Positive for rash (Speckled rash which appears to be healing that covers most of the back from the lower neck down to the lumbar area). Negative for color change and pallor.  Allergic/Immunologic: Negative.   Neurological: Negative for dizziness, tremors, seizures, syncope, weakness, numbness and headaches.  Hematological: Negative for adenopathy. Does not bruise/bleed easily.  Psychiatric/Behavioral: Negative for suicidal ideas, hallucinations, behavioral problems, confusion, sleep disturbance, dysphoric mood and agitation. The patient is not nervous/anxious and is not hyperactive.     Filed Vitals:   02/27/14 1506  BP: 130/80  Pulse: 74  Temp: 98.4 F (36.9 C)  Resp: 16  Height: '5\' 2"'  (1.575 m)  Weight: 133 lb (60.328 kg)   Body mass index is 24.32 kg/(m^2).  Physical Exam  Constitutional: She is oriented to person, place, and time. She appears well-developed and well-nourished. No distress.  HENT:  Right  Ear: External ear normal.  Left Ear: External ear normal.  Nose: Nose normal.  Mouth/Throat: Oropharynx is clear and moist. No oropharyngeal exudate.  Impaired hearing bilaterally  Eyes: Conjunctivae and EOM are normal. Pupils are equal, round, and reactive to light. No scleral icterus.  Neck: No JVD present. No tracheal deviation present. No thyromegaly present.  Cardiovascular: Normal rate, regular rhythm, normal heart sounds and intact distal pulses.  Exam reveals no gallop and no friction rub.   No murmur heard. Pulmonary/Chest: Effort normal. No respiratory distress. She has no wheezes. She has no rales. She exhibits no tenderness.  Abdominal: She exhibits no distension and no mass. There is no tenderness.  Musculoskeletal: Normal range of motion. She exhibits edema (Approximately 1+ bilaterally). She exhibits no tenderness.  Healing scar of the right hip. Able to stand and walk with a walker.  Lymphadenopathy:    She has no cervical adenopathy.  Neurological: She is alert and oriented to person, place, and time. No cranial nerve deficit. Coordination normal.  Skin: Rash (Speckled rash present over most of her back from the neck to the lumbosacral area. It looks like there was an allergic reaction or an irritative reaction which is now healing.) noted. She is not diaphoretic. No erythema. No pallor.  Psychiatric: She has a normal mood and affect. Her behavior is normal. Judgment and thought content normal.     Labs reviewed: Admission on 02/20/2014, Discharged on 02/23/2014  Component Date Value Ref Range Status  . ABO/RH(D) 02/20/2014 O POS   Final  . Antibody Screen 02/20/2014 NEG   Final  . Sample Expiration 02/20/2014 02/23/2014   Final  . ABO/RH(D) 02/20/2014 O POS   Final  . WBC 02/21/2014 9.6  4.0 - 10.5 K/uL Final  . RBC 02/21/2014 3.28* 3.87 - 5.11 MIL/uL Final  . Hemoglobin 02/21/2014 10.3* 12.0 - 15.0 g/dL Final  . HCT 02/21/2014 31.1* 36.0 - 46.0 % Final  . MCV  02/21/2014 94.8  78.0 - 100.0 fL Final  . MCH 02/21/2014 31.4  26.0 - 34.0 pg Final  . MCHC 02/21/2014 33.1  30.0 - 36.0 g/dL Final  . RDW 02/21/2014 13.1  11.5 - 15.5 % Final  . Platelets 02/21/2014 182  150 - 400 K/uL Final  . Sodium 02/21/2014 133* 135 - 145 mmol/L Final  . Potassium 02/21/2014 4.0  3.5 - 5.1 mmol/L Final  .  Chloride 02/21/2014 102  96 - 112 mmol/L Final  . CO2 02/21/2014 23  19 - 32 mmol/L Final  . Glucose, Bld 02/21/2014 154* 70 - 99 mg/dL Final  . BUN 02/21/2014 12  6 - 23 mg/dL Final  . Creatinine, Ser 02/21/2014 0.69  0.50 - 1.10 mg/dL Final  . Calcium 02/21/2014 8.1* 8.4 - 10.5 mg/dL Final  . GFR calc non Af Amer 02/21/2014 78* >90 mL/min Final  . GFR calc Af Amer 02/21/2014 >90  >90 mL/min Final   Comment: (NOTE) The eGFR has been calculated using the CKD EPI equation. This calculation has not been validated in all clinical situations. eGFR's persistently <90 mL/min signify possible Chronic Kidney Disease.   . Anion gap 02/21/2014 8  5 - 15 Final  . WBC 02/22/2014 16.1* 4.0 - 10.5 K/uL Final  . RBC 02/22/2014 3.15* 3.87 - 5.11 MIL/uL Final  . Hemoglobin 02/22/2014 10.1* 12.0 - 15.0 g/dL Final  . HCT 02/22/2014 30.2* 36.0 - 46.0 % Final  . MCV 02/22/2014 95.9  78.0 - 100.0 fL Final  . MCH 02/22/2014 32.1  26.0 - 34.0 pg Final  . MCHC 02/22/2014 33.4  30.0 - 36.0 g/dL Final  . RDW 02/22/2014 13.6  11.5 - 15.5 % Final  . Platelets 02/22/2014 171  150 - 400 K/uL Final  . Sodium 02/22/2014 139  135 - 145 mmol/L Final   REPEATED TO VERIFY  . Potassium 02/22/2014 4.1  3.5 - 5.1 mmol/L Final  . Chloride 02/22/2014 109  96 - 112 mmol/L Final  . CO2 02/22/2014 25  19 - 32 mmol/L Final  . Glucose, Bld 02/22/2014 128* 70 - 99 mg/dL Final  . BUN 02/22/2014 16  6 - 23 mg/dL Final  . Creatinine, Ser 02/22/2014 0.56  0.50 - 1.10 mg/dL Final  . Calcium 02/22/2014 8.6  8.4 - 10.5 mg/dL Final  . GFR calc non Af Amer 02/22/2014 84* >90 mL/min Final  . GFR calc Af Amer  02/22/2014 >90  >90 mL/min Final   Comment: (NOTE) The eGFR has been calculated using the CKD EPI equation. This calculation has not been validated in all clinical situations. eGFR's persistently <90 mL/min signify possible Chronic Kidney Disease.   . Anion gap 02/22/2014 5  5 - 15 Final  . WBC 02/23/2014 11.8* 4.0 - 10.5 K/uL Final  . RBC 02/23/2014 3.20* 3.87 - 5.11 MIL/uL Final  . Hemoglobin 02/23/2014 10.1* 12.0 - 15.0 g/dL Final  . HCT 02/23/2014 31.1* 36.0 - 46.0 % Final  . MCV 02/23/2014 97.2  78.0 - 100.0 fL Final  . MCH 02/23/2014 31.6  26.0 - 34.0 pg Final  . MCHC 02/23/2014 32.5  30.0 - 36.0 g/dL Final  . RDW 02/23/2014 13.7  11.5 - 15.5 % Final  . Platelets 02/23/2014 178  150 - 400 K/uL Final  Hospital Outpatient Visit on 02/16/2014  Component Date Value Ref Range Status  . aPTT 02/16/2014 31  24 - 37 seconds Final  . WBC 02/16/2014 7.7  4.0 - 10.5 K/uL Final  . RBC 02/16/2014 4.30  3.87 - 5.11 MIL/uL Final  . Hemoglobin 02/16/2014 13.5  12.0 - 15.0 g/dL Final  . HCT 02/16/2014 41.5  36.0 - 46.0 % Final  . MCV 02/16/2014 96.5  78.0 - 100.0 fL Final  . MCH 02/16/2014 31.4  26.0 - 34.0 pg Final  . MCHC 02/16/2014 32.5  30.0 - 36.0 g/dL Final  . RDW 02/16/2014 13.4  11.5 - 15.5 % Final  .  Platelets 02/16/2014 226  150 - 400 K/uL Final  . Sodium 02/16/2014 140  135 - 145 mmol/L Final  . Potassium 02/16/2014 3.9  3.5 - 5.1 mmol/L Final  . Chloride 02/16/2014 106  96 - 112 mmol/L Final  . CO2 02/16/2014 26  19 - 32 mmol/L Final  . Glucose, Bld 02/16/2014 88  70 - 99 mg/dL Final  . BUN 02/16/2014 22  6 - 23 mg/dL Final  . Creatinine, Ser 02/16/2014 0.62  0.50 - 1.10 mg/dL Final  . Calcium 02/16/2014 9.0  8.4 - 10.5 mg/dL Final  . Total Protein 02/16/2014 7.1  6.0 - 8.3 g/dL Final  . Albumin 02/16/2014 4.0  3.5 - 5.2 g/dL Final  . AST 02/16/2014 27  0 - 37 U/L Final  . ALT 02/16/2014 17  0 - 35 U/L Final  . Alkaline Phosphatase 02/16/2014 85  39 - 117 U/L Final  . Total  Bilirubin 02/16/2014 0.8  0.3 - 1.2 mg/dL Final  . GFR calc non Af Amer 02/16/2014 81* >90 mL/min Final  . GFR calc Af Amer 02/16/2014 >90  >90 mL/min Final   Comment: (NOTE) The eGFR has been calculated using the CKD EPI equation. This calculation has not been validated in all clinical situations. eGFR's persistently <90 mL/min signify possible Chronic Kidney Disease.   . Anion gap 02/16/2014 8  5 - 15 Final  . Prothrombin Time 02/16/2014 14.0  11.6 - 15.2 seconds Final  . INR 02/16/2014 1.07  0.00 - 1.49 Final  . Color, Urine 02/16/2014 YELLOW  YELLOW Final  . APPearance 02/16/2014 CLOUDY* CLEAR Final  . Specific Gravity, Urine 02/16/2014 1.010  1.005 - 1.030 Final  . pH 02/16/2014 7.5  5.0 - 8.0 Final  . Glucose, UA 02/16/2014 NEGATIVE  NEGATIVE mg/dL Final  . Hgb urine dipstick 02/16/2014 NEGATIVE  NEGATIVE Final  . Bilirubin Urine 02/16/2014 NEGATIVE  NEGATIVE Final  . Ketones, ur 02/16/2014 NEGATIVE  NEGATIVE mg/dL Final  . Protein, ur 02/16/2014 NEGATIVE  NEGATIVE mg/dL Final  . Urobilinogen, UA 02/16/2014 0.2  0.0 - 1.0 mg/dL Final  . Nitrite 02/16/2014 NEGATIVE  NEGATIVE Final  . Leukocytes, UA 02/16/2014 MODERATE* NEGATIVE Final  . MRSA, PCR 02/16/2014 NEGATIVE  NEGATIVE Final  . Staphylococcus aureus 02/16/2014 NEGATIVE  NEGATIVE Final   Comment:        The Xpert SA Assay (FDA approved for NASAL specimens in patients over 62 years of age), is one component of a comprehensive surveillance program.  Test performance has been validated by [facility] for patients greater than or equal to 67 year old. It is not intended to diagnose infection nor to guide or monitor treatment.   . Squamous Epithelial / LPF 02/16/2014 FEW* RARE Final  . WBC, UA 02/16/2014 3-6  <3 WBC/hpf Final  . Bacteria, UA 02/16/2014 FEW* RARE Final    Dg Pelvis Portable  02/20/2014   CLINICAL DATA:  Patient status post right hip replacement.  EXAM: DG C-ARM 1-60 MIN - NRPT [facility]; PORTABLE PELVIS  1-2 VIEWS  COMPARISON:  None.  FINDINGS: Patient status post total right hip arthroplasty. Hardware appears intact. There is a drain projecting over the soft tissues. Irregular lucency within the right acetabular region is nonspecific however may be secondary to cystic change from prior degenerative changes. Mild left hip joint degenerative change.  IMPRESSION: Status post right hip arthroplasty.  Minimal lucency within the region of the inferior right acetabulum may be secondary to cystic change from prior degenerative changes.  Recommend attention on followup.   Electronically Signed   By: Lovey Newcomer M.D.   On: 02/20/2014 16:42   Dg C-arm 1-60 Min-no Report  02/20/2014   CLINICAL DATA:  Patient status post right hip replacement.  EXAM: DG C-ARM 1-60 MIN - [facility]PT [facility]; PORTABLE PELVIS 1-2 VIEWS  COMPARISON:  None.  FINDINGS: Patient status post total right hip arthroplasty. Hardware appears intact. There is a drain projecting over the soft tissues. Irregular lucency within the right acetabular region is nonspecific however may be secondary to cystic change from prior degenerative changes. Mild left hip joint degenerative change.  IMPRESSION: Status post right hip arthroplasty.  Minimal lucency within the region of the inferior right acetabulum may be secondary to cystic change from prior degenerative changes. Recommend attention on followup.   Electronically Signed   By: Lovey Newcomer M.D.   On: 02/20/2014 16:42     Assessment/Plan   1. Rash and nonspecific skin eruption Continue hydrocortisone cream  2. Lactose intolerance Patient will continue to monitor her diet and avoid milk products  3. Hypothyroidism, unspecified hypothyroidism type Observe.  4. Primary osteoarthritis of right hip Status post right hip total hip arthroplasty and doing well with physical therapy.

## 2014-03-06 ENCOUNTER — Non-Acute Institutional Stay (SKILLED_NURSING_FACILITY): Payer: Medicare Other | Admitting: Adult Health

## 2014-03-06 DIAGNOSIS — K5900 Constipation, unspecified: Secondary | ICD-10-CM

## 2014-03-06 DIAGNOSIS — M1611 Unilateral primary osteoarthritis, right hip: Secondary | ICD-10-CM

## 2014-03-06 DIAGNOSIS — D62 Acute posthemorrhagic anemia: Secondary | ICD-10-CM

## 2014-03-06 DIAGNOSIS — E876 Hypokalemia: Secondary | ICD-10-CM

## 2014-03-07 ENCOUNTER — Encounter: Payer: Self-pay | Admitting: Adult Health

## 2014-03-07 NOTE — Progress Notes (Signed)
Patient ID: Virginia Bullock, female   DOB: [birth year], 81 y.o.   MRN: [id number]   03/06/14  Facility:  [facility] Home Location:  [place] Room Number: [id number] LEVEL OF CARE:  SNF (31)   Chief Complaint  Patient presents with  . Discharge Note    Osteoarthritis S/P right total hip arthroplasty, hypokalemia and anemia    HISTORY OF PRESENT ILLNESS:  This is an 81 year old female who is for discharge home with home health PT and OT. She has been admitted to [facility] on 02/23/14 from [facility] with osteoarthritis S/P right total hip arthroplasty. She has past medical history of history of shingles, migraine headache and hypothyroidism.  Patient was admitted to this facility for short-term rehabilitation after the patient's recent hospitalization.  Patient has completed SNF rehabilitation and therapy has cleared the patient for discharge.  PAST MEDICAL HISTORY:  Past Medical History  Diagnosis Date  . History of shingles     x2  . Post-nasal drip   . Hypothyroidism   . Complication of anesthesia     "epidural with 5th baby-stopped breathing and went numb, 'died'"  . Arthritis     right hip  . Basal cell carcinoma of skin   . Diverticulosis of colon without hemorrhage 02/27/2014  . Lactose intolerance 02/27/2014  . Loss of hearing 02/27/2014  . Migraine headache 02/27/2014  . OA (osteoarthritis) of hip 02/20/2014  . Osteopenia 02/27/2014  . Rash and nonspecific skin eruption 02/27/2014  . History of herpes zoster 02/27/2014    CURRENT MEDICATIONS: Reviewed per MAR/see medication list  Allergies  Allergen Reactions  . Benadryl [Diphenhydramine Hcl]     Panic attack symptoms   . Codeine Nausea And Vomiting  . Demerol [Meperidine] Nausea And Vomiting     REVIEW OF SYSTEMS:  GENERAL: no change in appetite, no fatigue, no weight changes, no fever, chills or weakness RESPIRATORY: no cough, SOB, DOE, wheezing, hemoptysis CARDIAC: no chest  pain, edema or palpitations GI: no abdominal pain, diarrhea, constipation, heart burn, nausea or vomiting  PHYSICAL EXAMINATION  GENERAL: no acute distress, normal body habitus EYES: conjunctivae normal, sclerae normal, normal eye lids NECK: supple, trachea midline, no neck masses, no thyroid tenderness, no thyromegaly LYMPHATICS: no LAN in the neck, no supraclavicular LAN RESPIRATORY: breathing is even & unlabored, BS CTAB CARDIAC: RRR, no murmur,no extra heart sounds, RLE edema 1+ GI: abdomen soft, normal BS, no masses, no tenderness, no hepatomegaly, no splenomegaly EXTREMITIES: Able to move 4 extremities PSYCHIATRIC: the patient is alert & oriented to person, affect & behavior appropriate  LABS/RADIOLOGY: Labs reviewed: Basic Metabolic Panel:  Recent Labs  02/16/14 1430 02/21/14 0500 02/22/14 0530  NA 140 133* 139  K 3.9 4.0 4.1  CL 106 102 109  CO2 26 23 25   GLUCOSE 88 154* 128*  BUN 22 12 16   CREATININE 0.62 0.69 0.56  CALCIUM 9.0 8.1* 8.6   Liver Function Tests:  Recent Labs  02/16/14 1430  AST 27  ALT 17  ALKPHOS 85  BILITOT 0.8  PROT 7.1  ALBUMIN 4.0   CBC:  Recent Labs  02/21/14 0500 02/22/14 0530 02/23/14 0503  WBC 9.6 16.1* 11.8*  HGB 10.3* 10.1* 10.1*  HCT 31.1* 30.2* 31.1*  MCV 94.8 95.9 97.2  PLT 182 171 178    Dg Pelvis Portable  02/20/2014   CLINICAL DATA:  Patient status post right hip replacement.  EXAM: DG C-ARM 1-60 MIN - [facility]PT [facility]; PORTABLE  PELVIS 1-2 VIEWS  COMPARISON:  None.  FINDINGS: Patient status post total right hip arthroplasty. Hardware appears intact. There is a drain projecting over the soft tissues. Irregular lucency within the right acetabular region is nonspecific however may be secondary to cystic change from prior degenerative changes. Mild left hip joint degenerative change.  IMPRESSION: Status post right hip arthroplasty.  Minimal lucency within the region of the inferior right acetabulum may be secondary to cystic  change from prior degenerative changes. Recommend attention on followup.   Electronically Signed   By: Lovey Newcomer M.D.   On: 02/20/2014 16:42   Dg C-arm 1-60 Min-no Report  02/20/2014   CLINICAL DATA:  Patient status post right hip replacement.  EXAM: DG C-ARM 1-60 MIN - [facility]PT [facility]; PORTABLE PELVIS 1-2 VIEWS  COMPARISON:  None.  FINDINGS: Patient status post total right hip arthroplasty. Hardware appears intact. There is a drain projecting over the soft tissues. Irregular lucency within the right acetabular region is nonspecific however may be secondary to cystic change from prior degenerative changes. Mild left hip joint degenerative change.  IMPRESSION: Status post right hip arthroplasty.  Minimal lucency within the region of the inferior right acetabulum may be secondary to cystic change from prior degenerative changes. Recommend attention on followup.   Electronically Signed   By: Lovey Newcomer M.D.   On: 02/20/2014 16:42    ASSESSMENT/PLAN:  Osteoarthritis S/P right total hip arthroplasty - for home health PT and OT; continue aspirin 325 mg by mouth twice a day for DVT prophylaxis; tramadol 50 mg 1-2 tabs by mouth every 6 hours when necessary for pain; and Robaxin 500 mg 1 tab by mouth every 6 hours when necessary Anemia, acute blood loss - hemoglobin 10.1; stable Constipation - continue Colace 100 mg by mouth twice a day, MiraLAX 17 g +4-6 ounces take with by mouth twice a day and senna S2 tabs by mouth twice a day Hypokalemia - K4.1; continue supplementation   I have filled out patient's discharge paperwork and written prescriptions.  Patient will receive home health PT and OT.  Total discharge time: Less than 30 minutes  Discharge time involved coordination of the discharge process with Education officer, museum, nursing staff and therapy department. Medical justification for home health services verified.    [facility], NP [employer] [phone]

## 2014-12-01 ENCOUNTER — Ambulatory Visit: Payer: Self-pay | Admitting: Orthopedic Surgery

## 2014-12-01 NOTE — Progress Notes (Signed)
Preoperative surgical orders have been place into the Epic hospital system for Virginia Bullock on 12/01/2014, 5:20 PM  by Mickel Crow for surgery on 12-23-14.  Preop Total Hip - Anterior Approach orders including IV Tylenol, and IV Decadron as long as there are no contraindications to the above medications. Arlee Muslim, PA-C

## 2014-12-15 NOTE — Progress Notes (Addendum)
EKG- 02/20/14- EPIC

## 2014-12-15 NOTE — Patient Instructions (Addendum)
Virginia Bullock  12/15/2014   Your procedure is scheduled on:  12/23/2014    Report to [facility] Main  Entrance take [place]  elevators to 3rd floor to  [place] at    [place] AM.  Call this number if you have problems the morning of surgery [phone]   Remember: ONLY 1 PERSON MAY GO WITH YOU TO SHORT STAY TO GET  READY MORNING OF YOUR SURGERY.  Do not eat food or drink liquids :After Midnight.     Take these medicines the morning of surgery with A SIP OF WATER: none                                You may not have any metal on your body including hair pins and              piercings  Do not wear jewelry, make-up, lotions, powders or perfumes, deodorant             Do not wear nail polish.  Do not shave  48 hours prior to surgery.              Do not bring valuables to the hospital. [place].  Contacts, dentures or bridgework may not be worn into surgery.  Leave suitcase in the car. After surgery it may be brought to your room.       Special Instructions: coughing and deep breathing exercises, leg exercises               Please read over the following fact sheets you were given: _____________________________________________________________________             [facility] - Preparing for Surgery Before surgery, you can play an important role.  Because skin is not sterile, your skin needs to be as free of germs as possible.  You can reduce the number of germs on your skin by washing with CHG (chlorahexidine gluconate) soap before surgery.  CHG is an antiseptic cleaner which kills germs and bonds with the skin to continue killing germs even after washing. Please DO NOT use if you have an allergy to CHG or antibacterial soaps.  If your skin becomes reddened/irritated stop using the CHG and inform your nurse when you arrive at Short Stay. Do not shave (including legs and underarms) for at least 48 hours  prior to the first CHG shower.  You may shave your face/neck. Please follow these instructions carefully:  1.  Shower with CHG Soap the night before surgery and the  morning of Surgery.  2.  If you choose to wash your hair, wash your hair first as usual with your  normal  shampoo.  3.  After you shampoo, rinse your hair and body thoroughly to remove the  shampoo.                           4.  Use CHG as you would any other liquid soap.  You can apply chg directly  to the skin and wash                       Gently with a scrungie  or clean washcloth.  5.  Apply the CHG Soap to your body ONLY FROM THE NECK DOWN.   Do not use on face/ open                           Wound or open sores. Avoid contact with eyes, ears mouth and genitals (private parts).                       Wash face,  Genitals (private parts) with your normal soap.             6.  Wash thoroughly, paying special attention to the area where your surgery  will be performed.  7.  Thoroughly rinse your body with warm water from the neck down.  8.  DO NOT shower/wash with your normal soap after using and rinsing off  the CHG Soap.                9.  Pat yourself dry with a clean towel.            10.  Wear clean pajamas.            11.  Place clean sheets on your bed the night of your first shower and do not  sleep with pets. Day of Surgery : Do not apply any lotions/deodorants the morning of surgery.  Please wear clean clothes to the hospital/surgery center.  FAILURE TO FOLLOW THESE INSTRUCTIONS MAY RESULT IN THE CANCELLATION OF YOUR SURGERY PATIENT SIGNATURE_________________________________  NURSE SIGNATURE__________________________________  ________________________________________________________________________  WHAT IS A BLOOD TRANSFUSION? Blood Transfusion Information  A transfusion is the replacement of blood or some of its parts. Blood is made up of multiple cells which provide different functions.  Red blood cells carry  oxygen and are used for blood loss replacement.  White blood cells fight against infection.  Platelets control bleeding.  Plasma helps clot blood.  Other blood products are available for specialized needs, such as hemophilia or other clotting disorders. BEFORE THE TRANSFUSION  Who gives blood for transfusions?   Healthy volunteers who are fully evaluated to make sure their blood is safe. This is blood bank blood. Transfusion therapy is the safest it has ever been in the practice of medicine. Before blood is taken from a donor, a complete history is taken to make sure that person has no history of diseases nor engages in risky social behavior (examples are intravenous drug use or sexual activity with multiple partners). The donor's travel history is screened to minimize risk of transmitting infections, such as malaria. The donated blood is tested for signs of infectious diseases, such as HIV and hepatitis. The blood is then tested to be sure it is compatible with you in order to minimize the chance of a transfusion reaction. If you or a relative donates blood, this is often done in anticipation of surgery and is not appropriate for emergency situations. It takes many days to process the donated blood. RISKS AND COMPLICATIONS Although transfusion therapy is very safe and saves many lives, the main dangers of transfusion include:  1. Getting an infectious disease. 2. Developing a transfusion reaction. This is an allergic reaction to something in the blood you were given. Every precaution is taken to prevent this. The decision to have a blood transfusion has been considered carefully by your caregiver before blood is given. Blood is not given unless the benefits outweigh the risks. AFTER THE  TRANSFUSION  Right after receiving a blood transfusion, you will usually feel much better and more energetic. This is especially true if your red blood cells have gotten low (anemic). The transfusion raises the  level of the red blood cells which carry oxygen, and this usually causes an energy increase.  The nurse administering the transfusion will monitor you carefully for complications. HOME CARE INSTRUCTIONS  No special instructions are needed after a transfusion. You may find your energy is better. Speak with your caregiver about any limitations on activity for underlying diseases you may have. SEEK MEDICAL CARE IF:   Your condition is not improving after your transfusion.  You develop redness or irritation at the intravenous (IV) site. SEEK IMMEDIATE MEDICAL CARE IF:  Any of the following symptoms occur over the next 12 hours:  Shaking chills.  You have a temperature by mouth above 102 F (38.9 C), not controlled by medicine.  Chest, back, or muscle pain.  People around you feel you are not acting correctly or are confused.  Shortness of breath or difficulty breathing.  Dizziness and fainting.  You get a rash or develop hives.  You have a decrease in urine output.  Your urine turns a dark color or changes to pink, red, or brown. Any of the following symptoms occur over the next 10 days:  You have a temperature by mouth above 102 F (38.9 C), not controlled by medicine.  Shortness of breath.  Weakness after normal activity.  The white part of the eye turns yellow (jaundice).  You have a decrease in the amount of urine or are urinating less often.  Your urine turns a dark color or changes to pink, red, or brown. Document Released: 12/24/1999 Document Revised: 03/20/2011 Document Reviewed: 08/12/2007 ExitCare Patient Information 2014 [place].  _______________________________________________________________________  Incentive Spirometer  An incentive spirometer is a tool that can help keep your lungs clear and active. This tool measures how well you are filling your lungs with each breath. Taking long deep breaths may help reverse or decrease the chance of  developing breathing (pulmonary) problems (especially infection) following:  A long period of time when you are unable to move or be active. BEFORE THE PROCEDURE   If the spirometer includes an indicator to show your best effort, your nurse or respiratory therapist will set it to a desired goal.  If possible, sit up straight or lean slightly forward. Try not to slouch.  Hold the incentive spirometer in an upright position. INSTRUCTIONS FOR USE  3. Sit on the edge of your bed if possible, or sit up as far as you can in bed or on a chair. 4. Hold the incentive spirometer in an upright position. 5. Breathe out normally. 6. Place the mouthpiece in your mouth and seal your lips tightly around it. 7. Breathe in slowly and as deeply as possible, raising the piston or the ball toward the top of the column. 8. Hold your breath for 3-5 seconds or for as long as possible. Allow the piston or ball to fall to the bottom of the column. 9. Remove the mouthpiece from your mouth and breathe out normally. 10. Rest for a few seconds and repeat Steps 1 through 7 at least 10 times every 1-2 hours when you are awake. Take your time and take a few normal breaths between deep breaths. 11. The spirometer may include an indicator to show your best effort. Use the indicator as a goal to work toward during each repetition.  12. After each set of 10 deep breaths, practice coughing to be sure your lungs are clear. If you have an incision (the cut made at the time of surgery), support your incision when coughing by placing a pillow or rolled up towels firmly against it. Once you are able to get out of bed, walk around indoors and cough well. You may stop using the incentive spirometer when instructed by your caregiver.  RISKS AND COMPLICATIONS  Take your time so you do not get dizzy or light-headed.  If you are in pain, you may need to take or ask for pain medication before doing incentive spirometry. It is harder to take  a deep breath if you are having pain. AFTER USE  Rest and breathe slowly and easily.  It can be helpful to keep track of a log of your progress. Your caregiver can provide you with a simple table to help with this. If you are using the spirometer at home, follow these instructions: [place] IF:   You are having difficultly using the spirometer.  You have trouble using the spirometer as often as instructed.  Your pain medication is not giving enough relief while using the spirometer.  You develop fever of 100.5 F (38.1 C) or higher. SEEK IMMEDIATE MEDICAL CARE IF:   You cough up bloody sputum that had not been present before.  You develop fever of 102 F (38.9 C) or greater.  You develop worsening pain at or near the incision site. MAKE SURE YOU:   Understand these instructions.  Will watch your condition.  Will get help right away if you are not doing well or get worse. Document Released: 05/08/2006 Document Revised: 03/20/2011 Document Reviewed: 07/09/2006 [facility] Patient Information 2014 [place], [place].   ________________________________________________________________________

## 2014-12-16 ENCOUNTER — Other Ambulatory Visit (HOSPITAL_COMMUNITY): Payer: Self-pay | Admitting: *Deleted

## 2014-12-17 ENCOUNTER — Encounter (HOSPITAL_COMMUNITY): Payer: Self-pay

## 2014-12-17 ENCOUNTER — Encounter (HOSPITAL_COMMUNITY)
Admission: RE | Admit: 2014-12-17 | Discharge: 2014-12-17 | Disposition: A | Discharge status: Home / Self Care | Payer: Medicare Other | Source: Ambulatory Visit | Attending: Orthopedic Surgery | Admitting: Orthopedic Surgery

## 2014-12-17 DIAGNOSIS — M1612 Unilateral primary osteoarthritis, left hip: Secondary | ICD-10-CM | POA: Diagnosis not present

## 2014-12-17 DIAGNOSIS — Z01818 Encounter for other preprocedural examination: Secondary | ICD-10-CM | POA: Diagnosis present

## 2014-12-17 HISTORY — DX: Unspecified hearing loss, unspecified ear: H91.90

## 2014-12-17 HISTORY — DX: Nontoxic goiter, unspecified: E04.9

## 2014-12-17 LAB — URINALYSIS, ROUTINE W REFLEX MICROSCOPIC
Bilirubin Urine: NEGATIVE
GLUCOSE, UA: NEGATIVE mg/dL
Hgb urine dipstick: NEGATIVE
Ketones, ur: NEGATIVE mg/dL
LEUKOCYTES UA: NEGATIVE
Nitrite: NEGATIVE
PH: 7 (ref 5.0–8.0)
Protein, ur: NEGATIVE mg/dL
Specific Gravity, Urine: 1.01 (ref 1.005–1.030)

## 2014-12-17 LAB — COMPREHENSIVE METABOLIC PANEL
ALK PHOS: 85 U/L (ref 38–126)
ALT: 18 U/L (ref 14–54)
ANION GAP: 6 (ref 5–15)
AST: 29 U/L (ref 15–41)
Albumin: 4.1 g/dL (ref 3.5–5.0)
BUN: 21 mg/dL — ABNORMAL HIGH (ref 6–20)
CALCIUM: 9.3 mg/dL (ref 8.9–10.3)
CHLORIDE: 107 mmol/L (ref 101–111)
CO2: 27 mmol/L (ref 22–32)
CREATININE: 0.76 mg/dL (ref 0.44–1.00)
Glucose, Bld: 97 mg/dL (ref 65–99)
Potassium: 5.2 mmol/L — ABNORMAL HIGH (ref 3.5–5.1)
Sodium: 140 mmol/L (ref 135–145)
Total Bilirubin: 0.7 mg/dL (ref 0.3–1.2)
Total Protein: 7.3 g/dL (ref 6.5–8.1)

## 2014-12-17 LAB — PROTIME-INR
INR: 1.13 (ref 0.00–1.49)
PROTHROMBIN TIME: 14.7 s (ref 11.6–15.2)

## 2014-12-17 LAB — CBC
HCT: 40 % (ref 36.0–46.0)
Hemoglobin: 13.1 g/dL (ref 12.0–15.0)
MCH: 31.9 pg (ref 26.0–34.0)
MCHC: 32.8 g/dL (ref 30.0–36.0)
MCV: 97.3 fL (ref 78.0–100.0)
PLATELETS: 250 10*3/uL (ref 150–400)
RBC: 4.11 MIL/uL (ref 3.87–5.11)
RDW: 13.4 % (ref 11.5–15.5)
WBC: 8 10*3/uL (ref 4.0–10.5)

## 2014-12-17 LAB — SURGICAL PCR SCREEN
MRSA, PCR: NEGATIVE
Staphylococcus aureus: NEGATIVE

## 2014-12-17 LAB — APTT: APTT: 30 s (ref 24–37)

## 2014-12-22 ENCOUNTER — Ambulatory Visit: Payer: Self-pay | Admitting: Orthopedic Surgery

## 2014-12-22 NOTE — H&P (Signed)
Virginia Bullock DOB: [birth year] Widowed / Language: Cleophus Molt / Race: White Female Date of Admission:  12/23/2014 CC:  Left Hip Pain History of Present Illness The patient is a 81 year old female who comes in for a preoperative History and Physical. The patient is scheduled for a left total hip arthroplasty (anterior) to be performed by Dr. [name]. Aluisio, MD at [facility] on 12-23-2014. The patient is a 81 year old female who presents today for follow up of their hip. The patient is being followed for their left hip pain and osteoarthritis.  Symptoms reported include: pain (groin). The patient feels that they are doing poorly (getting worse). The following medication has been used for pain control: Tylenol (as little as possible). She decided not to get the IA hip injection with Dr. Nelva Bush due to financial reasons. The 5/16 heel lift helped a little. Her left hip surgery is scheduled for 12/23/14. She wants to make sure she does not go on the same blood thinner after surgery this time as it caused a rash last time. Her left hip has gotten progressively worse. She did great with her right hip and is looking forward to getting this fixed as it is limiting her. She is now ready to proceed with the left hip replacement at this time. They have been treated conservatively in the past for the above stated problem and despite conservative measures, they continue to have progressive pain and severe functional limitations and dysfunction. They have failed non-operative management including home exercise, medications. It is felt that they would benefit from undergoing total joint replacement. Risks and benefits of the procedure have been discussed with the patient and they elect to proceed with surgery. There are no active contraindications to surgery such as ongoing infection or rapidly progressive neurological disease.  Problem List/Past Medical Primary osteoarthritis of left hip (M16.12)  Status  post right hip replacement [id number])  Lactose intolerance (E73.9)  Migraine Headache  Skin Cancer  Squamous Cell and Basal Cell Hypothyroidism  Impaired Hearing  Shingles  Diverticulosis  Osteopenia   Allergies No Known Drug Allergies   Intolerances Demerol *ANALGESICS - OPIOID*  Vomiting. Benadryl *ANTIHISTAMINES*  "sends me to the moon" Codeine Sulfate *ANALGESICS - OPIOID*  Vomiting.  Family History  Family history unknown - Adopted  First Degree Relatives  reported  Social History Tobacco use  Former smoker. 11/20/2013: smoke(d) 1 pack(s) per day Children  5 or more Current drinker  11/20/2013: Currently drinks wine 5-7 times per week Exercise  Exercises daily; does other Living situation  live alone Marital status  widowed No history of drug/alcohol rehab  Tobacco / smoke exposure  11/20/2013: no Not under pain contract   Medication History Tylenol (Oral) Specific dose unknown - Active. (650mg ) Vitamin C (500MG  Tablet, 1 (one) Oral) Active. Vitamin D (400UNIT Capsule, 1 (one) Oral) Active. (D3) Magnesium Gluconate (Oral) Specific dose unknown - Active. B Complex Vitamin Active.   Past Surgical History Mastoidectomy  bilateral; age 34 Neck Disc Surgery  Fusion Straighten Nasal Septum  Spinal Fusion  neck Tonsillectomy  Twice - age 16 and age 66 Arthroscopy of Knee  left Hysterectomy  Date: 2011. partial (non-cancerous) Total Hip Replacement - Right   Review of Systems General Not Present- Chills, Fatigue, Fever, Memory Loss, Night Sweats, Weight Gain and Weight Loss. Skin Not Present- Eczema, Hives, Itching, Lesions and Rash. HEENT Not Present- Dentures, Double Vision, Headache, Hearing Loss, Tinnitus and Visual Loss. Respiratory Not Present- Allergies, Chronic Cough, Coughing  up blood, Shortness of breath at rest and Shortness of breath with exertion. Cardiovascular Not Present- Chest Pain, Difficulty Breathing Lying Down,  Murmur, Palpitations, Racing/skipping heartbeats and Swelling. Gastrointestinal Not Present- Abdominal Pain, Bloody Stool, Constipation, Diarrhea, Difficulty Swallowing, Heartburn, Jaundice, Loss of appetitie, Nausea and Vomiting. Female Genitourinary Not Present- Blood in Urine, Discharge, Flank Pain, Incontinence, Painful Urination, Urgency, Urinary frequency, Urinary Retention, Urinating at Night and Weak urinary stream. Musculoskeletal Present- Joint Pain. Not Present- Back Pain, Joint Swelling, Morning Stiffness, Muscle Pain, Muscle Weakness and Spasms. Neurological Not Present- Blackout spells, Difficulty with balance, Dizziness, Paralysis, Tremor and Weakness. Psychiatric Not Present- Insomnia.  Vitals Weight: 133 lb Height: 62in Weight was reported by patient. Height was reported by patient. Body Surface Area: 1.61 m Body Mass Index: 24.33 kg/m  BP: 120/76 (Sitting, Right Arm, Standard)   Physical Exam General Mental Status -Alert, cooperative and good historian. General Appearance-pleasant, Not in acute distress. Orientation-Oriented X3. Build & Nutrition-Well nourished and Well developed.  Head and Neck Head-normocephalic, atraumatic . Neck Global Assessment - supple, no bruit auscultated on the right, no bruit auscultated on the left. Note: usually wears one hearing aid   Eye Vision-Wears contact lenses. Pupil - Bilateral-Regular and Round. Motion - Bilateral-EOMI.  Chest and Lung Exam Auscultation Breath sounds - clear at anterior chest wall and clear at posterior chest wall. Adventitious sounds - No Adventitious sounds.  Cardiovascular Auscultation Rhythm - Regular rate and rhythm. Heart Sounds - S1 WNL and S2 WNL. Murmurs & Other Heart Sounds - Auscultation of the heart reveals - No Murmurs.  Abdomen Palpation/Percussion Tenderness - Abdomen is non-tender to palpation. Rigidity (guarding) - Abdomen is  soft. Auscultation Auscultation of the abdomen reveals - Bowel sounds normal.  Female Genitourinary Note: Not done, not pertinent to present illness   Musculoskeletal Note: Her left hip shows limited range of motion with full extension and further flexion to 110 degrees with external rotation of 20 degrees and internal rotation 5 degrees with pain, sensation and circulation are intact.  RADIOGRAPHS X-rays reveal bone-on-bone medial wall osteoarthritic change with superior lateral and inferior medial osteophyte formation.  Assessment & Plan Status post right hip replacement   Primary osteoarthritis of left hip   Note:Surgical Plans: Left Total Hip Replacement - Anterior Approach  Disposition: [place] [facility]  PCP: Dr. Dema Severin - Patient has been seen preoperatively and felt to be stable for surgery. "Please contact [facility] if assistance is required at time of surgery."  IV TXA  Anesthesia Issues: Complication with spinal/epidural following a birth back in [place]  Signed electronically by Joelene Millin, III PA-C

## 2014-12-23 ENCOUNTER — Encounter (HOSPITAL_COMMUNITY): Payer: Self-pay | Admitting: *Deleted

## 2014-12-23 ENCOUNTER — Inpatient Hospital Stay (HOSPITAL_COMMUNITY): Payer: Medicare Other

## 2014-12-23 ENCOUNTER — Inpatient Hospital Stay (HOSPITAL_COMMUNITY): Payer: Medicare Other | Admitting: Anesthesiology

## 2014-12-23 ENCOUNTER — Encounter (HOSPITAL_COMMUNITY)
Admission: RE | Disposition: A | Discharge status: Skilled Nursing Facility | Payer: Self-pay | Source: Ambulatory Visit | Attending: Orthopedic Surgery

## 2014-12-23 ENCOUNTER — Inpatient Hospital Stay (HOSPITAL_COMMUNITY)
Admission: RE | Admit: 2014-12-23 | Discharge: 2014-12-25 | DRG: 470 | Disposition: A | Discharge status: Skilled Nursing Facility | Payer: Medicare Other | Source: Ambulatory Visit | Attending: Orthopedic Surgery | Admitting: Orthopedic Surgery

## 2014-12-23 DIAGNOSIS — M1612 Unilateral primary osteoarthritis, left hip: Secondary | ICD-10-CM

## 2014-12-23 DIAGNOSIS — H9190 Unspecified hearing loss, unspecified ear: Secondary | ICD-10-CM | POA: Diagnosis present

## 2014-12-23 DIAGNOSIS — M25752 Osteophyte, left hip: Secondary | ICD-10-CM | POA: Diagnosis present

## 2014-12-23 DIAGNOSIS — M8580 Other specified disorders of bone density and structure, unspecified site: Secondary | ICD-10-CM | POA: Diagnosis present

## 2014-12-23 DIAGNOSIS — E039 Hypothyroidism, unspecified: Secondary | ICD-10-CM | POA: Diagnosis present

## 2014-12-23 DIAGNOSIS — Z96649 Presence of unspecified artificial hip joint: Secondary | ICD-10-CM

## 2014-12-23 DIAGNOSIS — Z87891 Personal history of nicotine dependence: Secondary | ICD-10-CM

## 2014-12-23 DIAGNOSIS — Z96641 Presence of right artificial hip joint: Secondary | ICD-10-CM | POA: Diagnosis present

## 2014-12-23 DIAGNOSIS — M169 Osteoarthritis of hip, unspecified: Secondary | ICD-10-CM | POA: Diagnosis present

## 2014-12-23 HISTORY — PX: TOTAL HIP ARTHROPLASTY: SHX124

## 2014-12-23 LAB — TYPE AND SCREEN
ABO/RH(D): O POS
Antibody Screen: NEGATIVE

## 2014-12-23 SURGERY — ARTHROPLASTY, HIP, TOTAL, ANTERIOR APPROACH
Anesthesia: General | Site: Hip | Laterality: Left

## 2014-12-23 MED ORDER — FENTANYL CITRATE (PF) 100 MCG/2ML IJ SOLN
25.0000 ug | INTRAMUSCULAR | Status: DC | PRN
  Administered 2014-12-23: 50 ug via INTRAVENOUS

## 2014-12-23 MED ORDER — BUPIVACAINE HCL (PF) 0.25 % IJ SOLN
INTRAMUSCULAR | Status: AC
  Filled 2014-12-23: qty 30

## 2014-12-23 MED ORDER — POLYETHYLENE GLYCOL 3350 17 G PO PACK: 17.0000 g | PACK | Freq: Every day | ORAL | Status: DC | PRN

## 2014-12-23 MED ORDER — ACETAMINOPHEN 650 MG RE SUPP: 650.0000 mg | Freq: Four times a day (QID) | RECTAL | Status: DC | PRN

## 2014-12-23 MED ORDER — ONDANSETRON HCL 4 MG/2ML IJ SOLN: 4.0000 mg | Freq: Four times a day (QID) | INTRAMUSCULAR | Status: DC | PRN

## 2014-12-23 MED ORDER — SODIUM CHLORIDE 0.9 % IV SOLN: INTRAVENOUS | Status: DC

## 2014-12-23 MED ORDER — ACETAMINOPHEN 10 MG/ML IV SOLN
1000.0000 mg | Freq: Once | INTRAVENOUS | Status: AC
  Administered 2014-12-23: 1000 mg via INTRAVENOUS

## 2014-12-23 MED ORDER — PROMETHAZINE HCL 25 MG/ML IJ SOLN: 6.2500 mg | INTRAMUSCULAR | Status: DC | PRN

## 2014-12-23 MED ORDER — LIDOCAINE HCL (CARDIAC) 20 MG/ML IV SOLN
INTRAVENOUS | Status: AC
  Filled 2014-12-23: qty 5

## 2014-12-23 MED ORDER — HYDROMORPHONE HCL 2 MG PO TABS
2.0000 mg | ORAL_TABLET | ORAL | Status: DC | PRN
  Administered 2014-12-24 – 2014-12-25 (×7): 2 mg via ORAL
  Filled 2014-12-23 (×7): qty 1

## 2014-12-23 MED ORDER — ACETAMINOPHEN 325 MG PO TABS: 650.0000 mg | ORAL_TABLET | Freq: Four times a day (QID) | ORAL | Status: DC | PRN

## 2014-12-23 MED ORDER — ROCURONIUM BROMIDE 100 MG/10ML IV SOLN
INTRAVENOUS | Status: DC | PRN
  Administered 2014-12-23: 10 mg via INTRAVENOUS
  Administered 2014-12-23: 40 mg via INTRAVENOUS

## 2014-12-23 MED ORDER — LIDOCAINE HCL (CARDIAC) 20 MG/ML IV SOLN
INTRAVENOUS | Status: DC | PRN
  Administered 2014-12-23: 100 mg via INTRAVENOUS

## 2014-12-23 MED ORDER — PHENOL 1.4 % MT LIQD
1.0000 | OROMUCOSAL | Status: DC | PRN
  Filled 2014-12-23: qty 177

## 2014-12-23 MED ORDER — ACETAMINOPHEN 10 MG/ML IV SOLN
INTRAVENOUS | Status: AC
  Filled 2014-12-23: qty 100

## 2014-12-23 MED ORDER — HYDROMORPHONE HCL 1 MG/ML IJ SOLN
INTRAMUSCULAR | Status: DC | PRN
  Administered 2014-12-23: 1 mg via INTRAVENOUS
  Administered 2014-12-23 (×2): 0.5 mg via INTRAVENOUS

## 2014-12-23 MED ORDER — CHLORHEXIDINE GLUCONATE 4 % EX LIQD: 60.0000 mL | Freq: Once | CUTANEOUS | Status: DC

## 2014-12-23 MED ORDER — LACTATED RINGERS IV SOLN
INTRAVENOUS | Status: DC
  Administered 2014-12-23: 15:00:00 via INTRAVENOUS

## 2014-12-23 MED ORDER — ACETAMINOPHEN 500 MG PO TABS
1000.0000 mg | ORAL_TABLET | Freq: Four times a day (QID) | ORAL | Status: AC
  Administered 2014-12-23 – 2014-12-24 (×4): 1000 mg via ORAL
  Filled 2014-12-23 (×4): qty 2

## 2014-12-23 MED ORDER — FENTANYL CITRATE (PF) 100 MCG/2ML IJ SOLN
INTRAMUSCULAR | Status: AC
  Filled 2014-12-23: qty 2

## 2014-12-23 MED ORDER — DOCUSATE SODIUM 100 MG PO CAPS
100.0000 mg | ORAL_CAPSULE | Freq: Two times a day (BID) | ORAL | Status: DC
  Administered 2014-12-23 – 2014-12-25 (×4): 100 mg via ORAL

## 2014-12-23 MED ORDER — HYDROXYZINE HCL 50 MG/ML IM SOLN
50.0000 mg | Freq: Once | INTRAMUSCULAR | Status: AC
  Administered 2014-12-23: 50 mg via INTRAMUSCULAR
  Filled 2014-12-23: qty 1

## 2014-12-23 MED ORDER — FLEET ENEMA 7-19 GM/118ML RE ENEM: 1.0000 | ENEMA | Freq: Once | RECTAL | Status: DC | PRN

## 2014-12-23 MED ORDER — ONDANSETRON HCL 4 MG/2ML IJ SOLN
INTRAMUSCULAR | Status: AC
  Filled 2014-12-23: qty 2

## 2014-12-23 MED ORDER — METOCLOPRAMIDE HCL 5 MG/ML IJ SOLN: 5.0000 mg | Freq: Three times a day (TID) | INTRAMUSCULAR | Status: DC | PRN

## 2014-12-23 MED ORDER — ONDANSETRON HCL 4 MG/2ML IJ SOLN
INTRAMUSCULAR | Status: DC | PRN
  Administered 2014-12-23: 4 mg via INTRAVENOUS

## 2014-12-23 MED ORDER — DEXTROSE-NACL 5-0.9 % IV SOLN
INTRAVENOUS | Status: DC
  Administered 2014-12-23: 18:00:00 via INTRAVENOUS

## 2014-12-23 MED ORDER — MENTHOL 3 MG MT LOZG: 1.0000 | LOZENGE | OROMUCOSAL | Status: DC | PRN

## 2014-12-23 MED ORDER — PROPOFOL 10 MG/ML IV BOLUS
INTRAVENOUS | Status: AC
  Filled 2014-12-23: qty 20

## 2014-12-23 MED ORDER — FENTANYL CITRATE (PF) 100 MCG/2ML IJ SOLN
INTRAMUSCULAR | Status: DC | PRN
  Administered 2014-12-23 (×2): 50 ug via INTRAVENOUS
  Administered 2014-12-23: 25 ug via INTRAVENOUS
  Administered 2014-12-23: 75 ug via INTRAVENOUS

## 2014-12-23 MED ORDER — CEFAZOLIN SODIUM-DEXTROSE 2-3 GM-% IV SOLR
2.0000 g | Freq: Three times a day (TID) | INTRAVENOUS | Status: AC
  Administered 2014-12-23 – 2014-12-24 (×2): 2 g via INTRAVENOUS
  Filled 2014-12-23 (×2): qty 50

## 2014-12-23 MED ORDER — HYDROMORPHONE HCL 2 MG/ML IJ SOLN
INTRAMUSCULAR | Status: AC
  Filled 2014-12-23: qty 1

## 2014-12-23 MED ORDER — TRAMADOL HCL 50 MG PO TABS: 50.0000 mg | ORAL_TABLET | Freq: Four times a day (QID) | ORAL | Status: DC | PRN

## 2014-12-23 MED ORDER — CEFAZOLIN SODIUM-DEXTROSE 2-3 GM-% IV SOLR
2.0000 g | INTRAVENOUS | Status: AC
  Administered 2014-12-23: 2 g via INTRAVENOUS

## 2014-12-23 MED ORDER — PROPOFOL 10 MG/ML IV BOLUS
INTRAVENOUS | Status: DC | PRN
  Administered 2014-12-23: 130 mg via INTRAVENOUS

## 2014-12-23 MED ORDER — 0.9 % SODIUM CHLORIDE (POUR BTL) OPTIME
TOPICAL | Status: DC | PRN
  Administered 2014-12-23: 1000 mL

## 2014-12-23 MED ORDER — SUGAMMADEX SODIUM 200 MG/2ML IV SOLN
INTRAVENOUS | Status: AC
  Filled 2014-12-23: qty 2

## 2014-12-23 MED ORDER — DEXAMETHASONE SODIUM PHOSPHATE 10 MG/ML IJ SOLN
10.0000 mg | Freq: Once | INTRAMUSCULAR | Status: AC
  Administered 2014-12-24: 10 mg via INTRAVENOUS
  Filled 2014-12-23: qty 1

## 2014-12-23 MED ORDER — ROCURONIUM BROMIDE 100 MG/10ML IV SOLN
INTRAVENOUS | Status: AC
  Filled 2014-12-23: qty 1

## 2014-12-23 MED ORDER — CEFAZOLIN SODIUM-DEXTROSE 2-3 GM-% IV SOLR
INTRAVENOUS | Status: AC
  Filled 2014-12-23: qty 50

## 2014-12-23 MED ORDER — BUPIVACAINE HCL (PF) 0.25 % IJ SOLN
INTRAMUSCULAR | Status: DC | PRN
  Administered 2014-12-23: 30 mL

## 2014-12-23 MED ORDER — DEXAMETHASONE SODIUM PHOSPHATE 10 MG/ML IJ SOLN
INTRAMUSCULAR | Status: DC | PRN
  Administered 2014-12-23: 10 mg via INTRAVENOUS

## 2014-12-23 MED ORDER — LACTATED RINGERS IV SOLN
INTRAVENOUS | Status: DC
  Administered 2014-12-23: 11:00:00 via INTRAVENOUS
  Administered 2014-12-23: 1000 mL via INTRAVENOUS

## 2014-12-23 MED ORDER — SODIUM CHLORIDE 0.9 % IV SOLN
1000.0000 mg | INTRAVENOUS | Status: AC
  Administered 2014-12-23: 1000 mg via INTRAVENOUS
  Filled 2014-12-23: qty 10

## 2014-12-23 MED ORDER — ONDANSETRON HCL 4 MG PO TABS: 4.0000 mg | ORAL_TABLET | Freq: Four times a day (QID) | ORAL | Status: DC | PRN

## 2014-12-23 MED ORDER — DEXAMETHASONE SODIUM PHOSPHATE 10 MG/ML IJ SOLN
INTRAMUSCULAR | Status: AC
  Filled 2014-12-23: qty 1

## 2014-12-23 MED ORDER — METHOCARBAMOL 1000 MG/10ML IJ SOLN
500.0000 mg | Freq: Four times a day (QID) | INTRAVENOUS | Status: DC | PRN
  Administered 2014-12-23: 500 mg via INTRAVENOUS
  Filled 2014-12-23 (×2): qty 5

## 2014-12-23 MED ORDER — BISACODYL 10 MG RE SUPP: 10.0000 mg | Freq: Every day | RECTAL | Status: DC | PRN

## 2014-12-23 MED ORDER — DEXAMETHASONE SODIUM PHOSPHATE 10 MG/ML IJ SOLN: 10.0000 mg | Freq: Once | INTRAMUSCULAR | Status: DC

## 2014-12-23 MED ORDER — METHOCARBAMOL 500 MG PO TABS: 500.0000 mg | ORAL_TABLET | Freq: Four times a day (QID) | ORAL | Status: DC | PRN

## 2014-12-23 MED ORDER — METOCLOPRAMIDE HCL 10 MG PO TABS: 5.0000 mg | ORAL_TABLET | Freq: Three times a day (TID) | ORAL | Status: DC | PRN

## 2014-12-23 MED ORDER — SUGAMMADEX SODIUM 200 MG/2ML IV SOLN
INTRAVENOUS | Status: DC | PRN
  Administered 2014-12-23: 150 mg via INTRAVENOUS

## 2014-12-23 MED ORDER — RIVAROXABAN 10 MG PO TABS
10.0000 mg | ORAL_TABLET | Freq: Every day | ORAL | Status: DC
  Administered 2014-12-24 – 2014-12-25 (×2): 10 mg via ORAL
  Filled 2014-12-23 (×3): qty 1

## 2014-12-23 MED ORDER — MORPHINE SULFATE (PF) 2 MG/ML IV SOLN: 1.0000 mg | INTRAVENOUS | Status: DC | PRN

## 2014-12-23 SURGICAL SUPPLY — 34 items
BAG DECANTER FOR FLEXI CONT (MISCELLANEOUS) ×3 IMPLANT
BAG SPEC THK2 15X12 ZIP CLS (MISCELLANEOUS)
BAG ZIPLOCK 12X15 (MISCELLANEOUS) IMPLANT
BLADE SAG 18X100X1.27 (BLADE) ×3 IMPLANT
CAPT HIP TOTAL 2 ×2 IMPLANT
CLOSURE WOUND 1/2 X4 (GAUZE/BANDAGES/DRESSINGS) ×1
CLOTH BEACON ORANGE TIMEOUT ST (SAFETY) ×3 IMPLANT
COVER PERINEAL POST (MISCELLANEOUS) ×3 IMPLANT
DECANTER SPIKE VIAL GLASS SM (MISCELLANEOUS) ×3 IMPLANT
DRAPE STERI IOBAN 125X83 (DRAPES) ×3 IMPLANT
DRAPE U-SHAPE 47X51 STRL (DRAPES) ×6 IMPLANT
DRSG ADAPTIC 3X8 NADH LF (GAUZE/BANDAGES/DRESSINGS) ×3 IMPLANT
DRSG MEPILEX BORDER 4X4 (GAUZE/BANDAGES/DRESSINGS) ×3 IMPLANT
DRSG MEPILEX BORDER 4X8 (GAUZE/BANDAGES/DRESSINGS) ×3 IMPLANT
DURAPREP 26ML APPLICATOR (WOUND CARE) ×3 IMPLANT
ELECT REM PT RETURN 9FT ADLT (ELECTROSURGICAL) ×3
ELECTRODE REM PT RTRN 9FT ADLT (ELECTROSURGICAL) ×1 IMPLANT
EVACUATOR 1/8 PVC DRAIN (DRAIN) ×3 IMPLANT
GLOVE BIO SURGEON STRL SZ7.5 (GLOVE) ×3 IMPLANT
GLOVE BIO SURGEON STRL SZ8 (GLOVE) ×6 IMPLANT
GLOVE BIOGEL PI IND STRL 8 (GLOVE) ×2 IMPLANT
GLOVE BIOGEL PI INDICATOR 8 (GLOVE) ×4
GOWN STRL REUS W/TWL LRG LVL3 (GOWN DISPOSABLE) ×3 IMPLANT
GOWN STRL REUS W/TWL XL LVL3 (GOWN DISPOSABLE) ×3 IMPLANT
PACK ANTERIOR HIP CUSTOM (KITS) ×3 IMPLANT
STRIP CLOSURE SKIN 1/2X4 (GAUZE/BANDAGES/DRESSINGS) ×2 IMPLANT
SUT ETHIBOND NAB CT1 #1 30IN (SUTURE) ×3 IMPLANT
SUT MNCRL AB 4-0 PS2 18 (SUTURE) ×3 IMPLANT
SUT VIC AB 2-0 CT1 27 (SUTURE) ×6
SUT VIC AB 2-0 CT1 TAPERPNT 27 (SUTURE) ×2 IMPLANT
SUT VLOC 180 0 24IN GS25 (SUTURE) ×3 IMPLANT
SYR 50ML LL SCALE MARK (SYRINGE) IMPLANT
TRAY FOLEY W/METER SILVER 14FR (SET/KITS/TRAYS/PACK) ×3 IMPLANT
YANKAUER SUCT BULB TIP 10FT TU (MISCELLANEOUS) ×3 IMPLANT

## 2014-12-23 NOTE — Progress Notes (Signed)
Utilization review completed.  

## 2014-12-23 NOTE — Anesthesia Postprocedure Evaluation (Signed)
Anesthesia Post Note  Patient: Virginia Bullock  Procedure(s) Performed: Procedure(s) (LRB): LEFT TOTAL HIP ARTHROPLASTY ANTERIOR APPROACH (Left)  Patient location during evaluation: PACU Anesthesia Type: General Level of consciousness: awake and alert Pain management: pain level controlled Vital Signs Assessment: post-procedure vital signs reviewed and stable Respiratory status: spontaneous breathing, nonlabored ventilation, respiratory function stable and patient connected to nasal cannula oxygen Cardiovascular status: blood pressure returned to baseline and stable Postop Assessment: no signs of nausea or vomiting Anesthetic complications: no    Last Vitals:  Filed Vitals:   12/23/14 1211 12/23/14 1215  BP: 151/94 166/87  Pulse: 77 75  Temp: 36.3 C   Resp: 27 28    Last Pain:  Filed Vitals:   12/23/14 1219  PainSc: 0-No pain                 Montez Hageman

## 2014-12-23 NOTE — Progress Notes (Signed)
X-ray results noted 

## 2014-12-23 NOTE — Anesthesia Preprocedure Evaluation (Signed)
Anesthesia Evaluation  Patient identified by MRN, date of birth, ID band Patient awake    Reviewed: Allergy & Precautions, NPO status , Patient's Chart, lab work & pertinent test results  History of Anesthesia Complications (+) history of anesthetic complications (difficulty with epidural in past. )  Airway Mallampati: II  TM Distance: >3 FB Neck ROM: Full    Dental no notable dental hx.    Pulmonary neg pulmonary ROS, former smoker,    Pulmonary exam normal breath sounds clear to auscultation       Cardiovascular negative cardio ROS Normal cardiovascular exam Rhythm:Regular Rate:Normal     Neuro/Psych negative neurological ROS  negative psych ROS   GI/Hepatic negative GI ROS, Neg liver ROS,   Endo/Other  Hypothyroidism   Renal/GU negative Renal ROS     Musculoskeletal  (+) Arthritis ,   Abdominal   Peds  Hematology negative hematology ROS (+)   Anesthesia Other Findings   Reproductive/Obstetrics negative OB ROS                             Anesthesia Physical  Anesthesia Plan  ASA: II  Anesthesia Plan: General   Post-op Pain Management:    Induction: Intravenous  Airway Management Planned: Oral ETT  Additional Equipment:   Intra-op Plan:   Post-operative Plan:   Informed Consent: I have reviewed the patients History and Physical, chart, labs and discussed the procedure including the risks, benefits and alternatives for the proposed anesthesia with the patient or authorized representative who has indicated his/her understanding and acceptance.   Dental advisory given  Plan Discussed with: CRNA  Anesthesia Plan Comments: (Difficulty with labor epidural in past. Patient refuses neuraxial anesthesia. Uneventful GA last time.)        Anesthesia Quick Evaluation

## 2014-12-23 NOTE — Progress Notes (Signed)
Portable AP Pelvis X-ray done. 

## 2014-12-23 NOTE — Anesthesia Procedure Notes (Signed)
Procedure Name: Intubation Date/Time: 12/23/2014 10:24 AM Performed by: Lind Covert Pre-anesthesia Checklist: Patient identified, Timeout performed, Emergency Drugs available, Suction available and Patient being monitored Patient Re-evaluated:Patient Re-evaluated prior to inductionOxygen Delivery Method: Circle system utilized Preoxygenation: Pre-oxygenation with 100% oxygen Intubation Type: IV induction Ventilation: Mask ventilation without difficulty Laryngoscope Size: Mac and 4 Grade View: Grade I Tube type: Oral Tube size: 7.0 mm Number of attempts: 1 Airway Equipment and Method: Stylet Placement Confirmation: ETT inserted through vocal cords under direct vision,  breath sounds checked- equal and bilateral and positive ETCO2 Secured at: 20 cm Tube secured with: Tape

## 2014-12-23 NOTE — Progress Notes (Signed)
Vistaril 50 mg I.M. Given for generalized itching all over given- no rash nor whelps noted

## 2014-12-23 NOTE — Interval H&P Note (Signed)
History and Physical Interval Note:  12/23/2014 9:45 AM  Virginia Bullock  has presented today for surgery, with the diagnosis of Osteoarthritis  LEFT HIP  The various methods of treatment have been discussed with the patient and family. After consideration of risks, benefits and other options for treatment, the patient has consented to  Procedure(s): LEFT TOTAL HIP ARTHROPLASTY ANTERIOR APPROACH (Left) as a surgical intervention .  The patient's history has been reviewed, patient examined, no change in status, stable for surgery.  I have reviewed the patient's chart and labs.  Questions were answered to the patient's satisfaction.     Gearlean Alf

## 2014-12-23 NOTE — Transfer of Care (Signed)
Immediate Anesthesia Transfer of Care Note  Patient: Virginia Bullock  Procedure(s) Performed: Procedure(s): LEFT TOTAL HIP ARTHROPLASTY ANTERIOR APPROACH (Left)  Patient Location: PACU  Anesthesia Type:General  Level of Consciousness: awake, sedated and responds to stimulation  Airway & Oxygen Therapy: Patient Spontanous Breathing and Patient connected to face mask oxygen  Post-op Assessment: Report given to RN and Post -op Vital signs reviewed and stable  Post vital signs: Reviewed and stable  Last Vitals:  Filed Vitals:   12/23/14 0847  BP: 132/79  Pulse: 74  Temp: 36.3 C  Resp: 16    Complications: No apparent anesthesia complications

## 2014-12-23 NOTE — H&P (View-Only) (Signed)
Virginia Bullock DOB: [birth year] Widowed / Language: Cleophus Molt / Race: White Female Date of Admission:  12/23/2014 CC:  Left Hip Pain History of Present Illness The patient is a 81 year old female who comes in for a preoperative History and Physical. The patient is scheduled for a left total hip arthroplasty (anterior) to be performed by Dr. [name]. Aluisio, MD at [facility] on 12-23-2014. The patient is a 81 year old female who presents today for follow up of their hip. The patient is being followed for their left hip pain and osteoarthritis.  Symptoms reported include: pain (groin). The patient feels that they are doing poorly (getting worse). The following medication has been used for pain control: Tylenol (as little as possible). She decided not to get the IA hip injection with Dr. Nelva Bush due to financial reasons. The 5/16 heel lift helped a little. Her left hip surgery is scheduled for 12/23/14. She wants to make sure she does not go on the same blood thinner after surgery this time as it caused a rash last time. Her left hip has gotten progressively worse. She did great with her right hip and is looking forward to getting this fixed as it is limiting her. She is now ready to proceed with the left hip replacement at this time. They have been treated conservatively in the past for the above stated problem and despite conservative measures, they continue to have progressive pain and severe functional limitations and dysfunction. They have failed non-operative management including home exercise, medications. It is felt that they would benefit from undergoing total joint replacement. Risks and benefits of the procedure have been discussed with the patient and they elect to proceed with surgery. There are no active contraindications to surgery such as ongoing infection or rapidly progressive neurological disease.  Problem List/Past Medical Primary osteoarthritis of left hip (M16.12)  Status  post right hip replacement [id number])  Lactose intolerance (E73.9)  Migraine Headache  Skin Cancer  Squamous Cell and Basal Cell Hypothyroidism  Impaired Hearing  Shingles  Diverticulosis  Osteopenia   Allergies No Known Drug Allergies   Intolerances Demerol *ANALGESICS - OPIOID*  Vomiting. Benadryl *ANTIHISTAMINES*  "sends me to the moon" Codeine Sulfate *ANALGESICS - OPIOID*  Vomiting.  Family History  Family history unknown - Adopted  First Degree Relatives  reported  Social History Tobacco use  Former smoker. 11/20/2013: smoke(d) 1 pack(s) per day Children  5 or more Current drinker  11/20/2013: Currently drinks wine 5-7 times per week Exercise  Exercises daily; does other Living situation  live alone Marital status  widowed No history of drug/alcohol rehab  Tobacco / smoke exposure  11/20/2013: no Not under pain contract   Medication History Tylenol (Oral) Specific dose unknown - Active. (650mg ) Vitamin C (500MG  Tablet, 1 (one) Oral) Active. Vitamin D (400UNIT Capsule, 1 (one) Oral) Active. (D3) Magnesium Gluconate (Oral) Specific dose unknown - Active. B Complex Vitamin Active.   Past Surgical History Mastoidectomy  bilateral; age 33 Neck Disc Surgery  Fusion Straighten Nasal Septum  Spinal Fusion  neck Tonsillectomy  Twice - age 81 and age 45 Arthroscopy of Knee  left Hysterectomy  Date: 2011. partial (non-cancerous) Total Hip Replacement - Right   Review of Systems General Not Present- Chills, Fatigue, Fever, Memory Loss, Night Sweats, Weight Gain and Weight Loss. Skin Not Present- Eczema, Hives, Itching, Lesions and Rash. HEENT Not Present- Dentures, Double Vision, Headache, Hearing Loss, Tinnitus and Visual Loss. Respiratory Not Present- Allergies, Chronic Cough, Coughing  up blood, Shortness of breath at rest and Shortness of breath with exertion. Cardiovascular Not Present- Chest Pain, Difficulty Breathing Lying Down,  Murmur, Palpitations, Racing/skipping heartbeats and Swelling. Gastrointestinal Not Present- Abdominal Pain, Bloody Stool, Constipation, Diarrhea, Difficulty Swallowing, Heartburn, Jaundice, Loss of appetitie, Nausea and Vomiting. Female Genitourinary Not Present- Blood in Urine, Discharge, Flank Pain, Incontinence, Painful Urination, Urgency, Urinary frequency, Urinary Retention, Urinating at Night and Weak urinary stream. Musculoskeletal Present- Joint Pain. Not Present- Back Pain, Joint Swelling, Morning Stiffness, Muscle Pain, Muscle Weakness and Spasms. Neurological Not Present- Blackout spells, Difficulty with balance, Dizziness, Paralysis, Tremor and Weakness. Psychiatric Not Present- Insomnia.  Vitals Weight: 133 lb Height: 62in Weight was reported by patient. Height was reported by patient. Body Surface Area: 1.61 m Body Mass Index: 24.33 kg/m  BP: 120/76 (Sitting, Right Arm, Standard)   Physical Exam General Mental Status -Alert, cooperative and good historian. General Appearance-pleasant, Not in acute distress. Orientation-Oriented X3. Build & Nutrition-Well nourished and Well developed.  Head and Neck Head-normocephalic, atraumatic . Neck Global Assessment - supple, no bruit auscultated on the right, no bruit auscultated on the left. Note: usually wears one hearing aid   Eye Vision-Wears contact lenses. Pupil - Bilateral-Regular and Round. Motion - Bilateral-EOMI.  Chest and Lung Exam Auscultation Breath sounds - clear at anterior chest wall and clear at posterior chest wall. Adventitious sounds - No Adventitious sounds.  Cardiovascular Auscultation Rhythm - Regular rate and rhythm. Heart Sounds - S1 WNL and S2 WNL. Murmurs & Other Heart Sounds - Auscultation of the heart reveals - No Murmurs.  Abdomen Palpation/Percussion Tenderness - Abdomen is non-tender to palpation. Rigidity (guarding) - Abdomen is  soft. Auscultation Auscultation of the abdomen reveals - Bowel sounds normal.  Female Genitourinary Note: Not done, not pertinent to present illness   Musculoskeletal Note: Her left hip shows limited range of motion with full extension and further flexion to 110 degrees with external rotation of 20 degrees and internal rotation 5 degrees with pain, sensation and circulation are intact.  RADIOGRAPHS X-rays reveal bone-on-bone medial wall osteoarthritic change with superior lateral and inferior medial osteophyte formation.  Assessment & Plan Status post right hip replacement   Primary osteoarthritis of left hip   Note:Surgical Plans: Left Total Hip Replacement - Anterior Approach  Disposition: [facility]  PCP: Dr. Dema Severin - Patient has been seen preoperatively and felt to be stable for surgery. "Please contact [facility] if assistance is required at time of surgery."  IV TXA  Anesthesia Issues: Complication with spinal/epidural following a birth back in [place]  Signed electronically by Joelene Millin, III PA-C

## 2014-12-23 NOTE — Op Note (Signed)
OPERATIVE REPORT  PREOPERATIVE DIAGNOSIS: Osteoarthritis of the Left hip.   POSTOPERATIVE DIAGNOSIS: Osteoarthritis of the Left  hip.   PROCEDURE: Left total hip arthroplasty, anterior approach.   SURGEON: Gaynelle Arabian, MD   ASSISTANT: Arlee Muslim, PA-C  ANESTHESIA:  General  ESTIMATED BLOOD LOSS:-400 ml    DRAINS: Hemovac x1.   COMPLICATIONS: None   CONDITION: PACU - hemodynamically stable.   BRIEF CLINICAL NOTE: Virginia Bullock is a 81 y.o. female who has advanced end-  stage arthritis of her Left  hip with progressively worsening pain and  dysfunction.The patient has failed nonoperative management and presents for  total hip arthroplasty.   PROCEDURE IN DETAIL: After successful administration of spinal  anesthetic, the traction boots for the [facility] bed were placed on both  feet and the patient was placed onto the [facility] bed, boots placed into the leg  holders. The Left hip was then isolated from the perineum with plastic  drapes and prepped and draped in the usual sterile fashion. ASIS and  greater trochanter were marked and a oblique incision was made, starting  at about 1 cm lateral and 2 cm distal to the ASIS and coursing towards  the anterior cortex of the femur. The skin was cut with a 10 blade  through subcutaneous tissue to the level of the fascia overlying the  tensor fascia lata muscle. The fascia was then incised in line with the  incision at the junction of the anterior third and posterior 2/3rd. The  muscle was teased off the fascia and then the interval between the TFL  and the rectus was developed. The Hohmann retractor was then placed at  the top of the femoral neck over the capsule. The vessels overlying the  capsule were cauterized and the fat on top of the capsule was removed.  A Hohmann retractor was then placed anterior underneath the rectus  femoris to give exposure to the entire anterior capsule. A T-shaped  capsulotomy was performed.  The edges were tagged and the femoral head  was identified.       Osteophytes are removed off the superior acetabulum.  The femoral neck was then cut in situ with an oscillating saw. Traction  was then applied to the left lower extremity utilizing the [facility]  traction. The femoral head was then removed. Retractors were placed  around the acetabulum and then circumferential removal of the labrum was  performed. Osteophytes were also removed. Reaming starts at 43 mm to  medialize and  Increased in 2 mm increments to 47 mm. We reamed in  approximately 40 degrees of abduction, 20 degrees anteversion. A 48 mm  pinnacle acetabular shell was then impacted in anatomic position under  fluoroscopic guidance with excellent purchase. We did not need to place  any additional dome screws. A 28 mm neutral + 4 marathon liner was then  placed into the acetabular shell.       The femoral lift was then placed along the lateral aspect of the femur  just distal to the vastus ridge. The leg was  externally rotated and capsule  was stripped off the inferior aspect of the femoral neck down to the  level of the lesser trochanter, this was done with electrocautery. The femur was lifted after this was performed. The  leg was then placed and extended in adducted position to essentially delivering the femur. We also removed the capsule superiorly and the  piriformis from the  piriformis fossa to gain excellent exposure of the  proximal femur. Rongeur was used to remove some cancellous bone to get  into the lateral portion of the proximal femur for placement of the  initial starter reamer. The starter broaches was placed  the starter broach  and was shown to go down the center of the canal. Broaching  with the  Corail system was then performed starting at size 8, coursing  Up to size 13. A size 13 had excellent torsional and rotational  and axial stability. The trial standard offset neck was then placed  with a 28 + 1.5  trial head. The hip was then reduced. We confirmed that  the stem was in the canal both on AP and lateral x-rays. It also has excellent sizing. The hip was reduced with outstanding stability through full extension, full external rotation,  and then flexion in adduction internal rotation. AP pelvis was taken  and the leg lengths were measured and found to be exactly equal. Hip  was then dislocated again and the femoral head and neck removed. The  femoral broach was removed. Size 13 Corail stem with a standard offset  neck was then impacted into the femur following native anteversion. Has  excellent purchase in the canal. Excellent torsional and rotational and  axial stability. It is confirmed to be in the canal on AP and lateral  fluoroscopic views. The 28 + 1.5 ceramic head was placed and the hip  reduced with outstanding stability. Again AP pelvis was taken and it  confirmed that the leg lengths were equal. The wound was then copiously  irrigated with saline solution and the capsule reattached and repaired  with Ethibond suture. 30 ml of .25% Bupivicaine injected into the capsule and into the edge of the tensor fascia lata as well as subcutaneous tissue. The fascia overlying the tensor fascia lata was  then closed with a running #1 V-Loc. Subcu was closed with interrupted  2-0 Vicryl and subcuticular running 4-0 Monocryl. Incision was cleaned  and dried. Steri-Strips and a bulky sterile dressing applied. Hemovac  drain was hooked to suction and then he was awakened and transported to  recovery in stable condition.        Please note that a surgical assistant was a medical necessity for this procedure to perform it in a safe and expeditious manner. Assistant was necessary to provide appropriate retraction of vital neurovascular structures and to prevent femoral fracture and allow for anatomic placement of the prosthesis.  Gaynelle Arabian, M.D.

## 2014-12-23 NOTE — Progress Notes (Signed)
Itching stopped  

## 2014-12-24 LAB — BASIC METABOLIC PANEL
Anion gap: 7 (ref 5–15)
BUN: 14 mg/dL (ref 6–20)
CO2: 24 mmol/L (ref 22–32)
CREATININE: 0.74 mg/dL (ref 0.44–1.00)
Calcium: 8.3 mg/dL — ABNORMAL LOW (ref 8.9–10.3)
Chloride: 108 mmol/L (ref 101–111)
GFR calc Af Amer: >60 mL/min (ref >60)
GLUCOSE: 184 mg/dL — AB (ref 65–99)
POTASSIUM: 4.6 mmol/L (ref 3.5–5.1)
Sodium: 139 mmol/L (ref 135–145)

## 2014-12-24 LAB — CBC
HCT: 30.8 % — ABNORMAL LOW (ref 36.0–46.0)
Hemoglobin: 10.1 g/dL — ABNORMAL LOW (ref 12.0–15.0)
MCH: 32 pg (ref 26.0–34.0)
MCHC: 32.8 g/dL (ref 30.0–36.0)
MCV: 97.5 fL (ref 78.0–100.0)
PLATELETS: 201 10*3/uL (ref 150–400)
RBC: 3.16 MIL/uL — AB (ref 3.87–5.11)
RDW: 13.5 % (ref 11.5–15.5)
WBC: 11.6 10*3/uL — ABNORMAL HIGH (ref 4.0–10.5)

## 2014-12-24 NOTE — Care Management Note (Signed)
Case Management Note  Patient Details  Name: Virginia Bullock MRN: [id number] Date of Birth: 1928-11-28  Subjective/Objective:    81 y.o. F admitted 12/23/2014 for L THA . PT recommending discharge to SNF. Will defer discharge planning to [place].                 Action/Plan: Anticipate discharge to SNF. No further CM needs but will be available should additional discharge needs arise.   Expected Discharge Date:                  Expected Discharge Plan:  Skilled Nursing Facility  In-House Referral:  Clinical Social Work  Discharge planning Services  CM Consult  Post Acute Care Choice:    Choice offered to:     DME Arranged:    DME Agency:     HH Arranged:    [place] Agency:     Status of Service:  Completed, signed off  Medicare Important Message Given:    Date Medicare IM Given:    Medicare IM give by:    Date Additional Medicare IM Given:    Additional Medicare Important Message give by:     If discussed at [place] of Stay Meetings, dates discussed:    Additional Comments:  Delrae Sawyers, RN 12/24/2014, 12:45 PM

## 2014-12-24 NOTE — NC FL2 (Signed)
[place] MEDICAID FL2 LEVEL OF CARE SCREENING TOOL     IDENTIFICATION  Patient Name: Virginia Bullock Birthdate: 1928-08-23 Sex: female Admission Date (Current Location): 12/23/2014  [facility] and [place] Number:     Facility and Address:  [facility],  [place] [address] [place]      Provider Number: [id number]  Attending Physician Name and Address:  Gaynelle Arabian, MD  Relative Name and Phone Number:       Current Level of Care: Hospital Recommended Level of Care: [place] Prior Approval Number:    Date Approved/Denied:   PASRR Number: [id number]  Discharge Plan: SNF    Current Diagnoses: Patient Active Problem List   Diagnosis Date Noted  . Rash and nonspecific skin eruption 02/27/2014  . Loss of hearing 02/27/2014  . Lactose intolerance 02/27/2014  . Migraine headache 02/27/2014  . Hypothyroidism 02/27/2014  . Osteopenia 02/27/2014  . OA (osteoarthritis) of hip 02/20/2014    Orientation RESPIRATION BLADDER Height & Weight    Self, Time, Situation, Place  Normal Continent 5\' 2"  (157.5 cm) 131 lbs.  BEHAVIORAL SYMPTOMS/MOOD NEUROLOGICAL BOWEL NUTRITION STATUS  Other (Comment) (no behaviors)   Continent Diet  AMBULATORY STATUS COMMUNICATION OF NEEDS Skin   Limited Assist Verbally Surgical wounds                       Personal Care Assistance Level of Assistance  Bathing, Feeding, Dressing Bathing Assistance: Limited assistance Feeding assistance: Independent Dressing Assistance: Limited assistance     Functional Limitations Info             SPECIAL CARE FACTORS FREQUENCY  PT (By licensed PT), OT (By licensed OT)     PT Frequency: 5 x wk OT Frequency: 5 x wk            Contractures Contractures Info: Not present    Additional Factors Info  Code Status, Allergies Code Status Info: Full Code             Current Medications (12/24/2014):  This is the current hospital active medication  list Current Facility-Administered Medications  Medication Dose Route Frequency Provider Last Rate Last Dose  . acetaminophen (TYLENOL) tablet 650 mg  650 mg Oral Q6H PRN Gaynelle Arabian, MD       Or  . acetaminophen (TYLENOL) suppository 650 mg  650 mg Rectal Q6H PRN Gaynelle Arabian, MD      . bisacodyl (DULCOLAX) suppository 10 mg  10 mg Rectal Daily PRN Gaynelle Arabian, MD      . dextrose 5 %-0.9 % sodium chloride infusion   Intravenous Continuous Arlee Muslim, PA-C 0 mL/hr at 12/24/14 [phone]    . docusate sodium (COLACE) capsule 100 mg  100 mg Oral BID Gaynelle Arabian, MD   100 mg at 12/24/14 1035  . HYDROmorphone (DILAUDID) tablet 2-4 mg  2-4 mg Oral Q4H PRN Gaynelle Arabian, MD   2 mg at 12/24/14 0843  . menthol-cetylpyridinium (CEPACOL) lozenge 3 mg  1 lozenge Oral PRN Gaynelle Arabian, MD       Or  . phenol (CHLORASEPTIC) mouth spray 1 spray  1 spray Mouth/Throat PRN Gaynelle Arabian, MD      . methocarbamol (ROBAXIN) tablet 500 mg  500 mg Oral Q6H PRN Gaynelle Arabian, MD       Or  . methocarbamol (ROBAXIN) 500 mg in dextrose 5 % 50 mL IVPB  500 mg Intravenous Q6H PRN Gaynelle Arabian, MD   500 mg  at 12/23/14 1245  . metoCLOPramide (REGLAN) tablet 5-10 mg  5-10 mg Oral Q8H PRN Gaynelle Arabian, MD       Or  . metoCLOPramide (REGLAN) injection 5-10 mg  5-10 mg Intravenous Q8H PRN Gaynelle Arabian, MD      . morphine 2 MG/ML injection 1 mg  1 mg Intravenous Q2H PRN Gaynelle Arabian, MD      . ondansetron [facility]) tablet 4 mg  4 mg Oral Q6H PRN Gaynelle Arabian, MD       Or  . ondansetron [facility]) injection 4 mg  4 mg Intravenous Q6H PRN Gaynelle Arabian, MD      . polyethylene glycol (MIRALAX / GLYCOLAX) packet 17 g  17 g Oral Daily PRN Gaynelle Arabian, MD      . rivaroxaban Alveda Reasons) tablet 10 mg  10 mg Oral Q breakfast Gaynelle Arabian, MD   10 mg at 12/24/14 0843  . sodium phosphate (FLEET) 7-19 GM/118ML enema 1 enema  1 enema Rectal Once PRN Gaynelle Arabian, MD      . traMADol Veatrice Bourbon) tablet 50-100 mg  50-100 mg Oral Q6H PRN  Gaynelle Arabian, MD         Discharge Medications: Please see discharge summary for a list of discharge medications.  Relevant Imaging Results:  Relevant Lab Results:   Additional Information SS # [id number]  Keah Lamba, Randall An, LCSW

## 2014-12-24 NOTE — Evaluation (Signed)
Physical Therapy Evaluation Patient Details Name: Virginia Bullock MRN: [id number] DOB: 05-15-1928 Today's Date: 12/24/2014   History of Present Illness  Pt is s/p L THA-DA  Clinical Impression  Pt s/p L THR presents with decreased L LE strength/ROM and post op pain limiting functional mobility.  Pt would benefit from short term follow up rehab at SNF level to maximize safety and IND prior to return home ALONE  Follow Up Recommendations SNF    Equipment Recommendations  None recommended by PT    Recommendations for Other Services OT consult     Precautions / Restrictions Precautions Precautions: Fall Restrictions Weight Bearing Restrictions: No Other Position/Activity Restrictions: WBAT      Mobility  Bed Mobility Overal bed mobility: Needs Assistance Bed Mobility: Supine to Sit     Supine to sit: Min assist     General bed mobility comments: cues for sequence and use of R LE to self assist  Transfers Overall transfer level: Needs assistance Equipment used: Rolling walker (2 wheeled) Transfers: Sit to/from Stand Sit to Stand: Min assist         General transfer comment: cues for LE management and use of UEs to self assist  Ambulation/Gait Ambulation/Gait assistance: Min assist Ambulation Distance (Feet): 200 Feet Assistive device: Rolling walker (2 wheeled) Gait Pattern/deviations: Step-to pattern;Step-through pattern;Decreased step length - right;Decreased step length - left;Shuffle;Trunk flexed Gait velocity: decr   General Gait Details: cues for posture, position from RW and initial sequence  Stairs            Wheelchair Mobility    Modified Rankin (Stroke Patients Only)       Balance                                             Pertinent Vitals/Pain Pain Assessment: 0-10 Pain Score: 2  Pain Location: L hip Pain Descriptors / Indicators: Aching;Sore Pain Intervention(s): Limited activity within patient's  tolerance;Monitored during session;Premedicated before session;Ice applied    Home Living Family/patient expects to be discharged to:: Skilled nursing facility Living Arrangements: Alone                    Prior Function Level of Independence: Independent               Hand Dominance        Extremity/Trunk Assessment   Upper Extremity Assessment: Overall WFL for tasks assessed           Lower Extremity Assessment: LLE deficits/detail   LLE Deficits / Details: Strength at hip 2+/5 with AAROM at hip to 90 flex and 15 abd     Communication   Communication: No difficulties  Cognition Arousal/Alertness: Awake/alert Behavior During Therapy: WFL for tasks assessed/performed Overall Cognitive Status: Within Functional Limits for tasks assessed                      General Comments      Exercises Total Joint Exercises Ankle Circles/Pumps: AROM;10 reps;Supine;Both Quad Sets: AROM;Both;10 reps;Supine Heel Slides: AAROM;Left;20 reps;Supine Hip ABduction/ADduction: AAROM;Left;15 reps;Supine      Assessment/Plan    PT Assessment Patient needs continued PT services  PT Diagnosis Difficulty walking   PT Problem List Decreased strength;Decreased range of motion;Decreased activity tolerance;Decreased mobility;Decreased knowledge of use of DME;Pain  PT Treatment Interventions DME instruction;Gait training;Stair training;Therapeutic activities;Therapeutic exercise;Patient/family education  PT Goals (Current goals can be found in the Care Plan section) Acute Rehab PT Goals Patient Stated Goal: home to cook for Christmas. PT Goal Formulation: With patient Potential to Achieve Goals: Good    Frequency 7X/week   Barriers to discharge        Co-evaluation               End of Session Equipment Utilized During Treatment: Gait belt Activity Tolerance: Patient tolerated treatment well Patient left: in chair;with call bell/phone within reach Nurse  Communication: Mobility status         Time: [id number] PT Time Calculation (min) (ACUTE ONLY): 33 min   Charges:   PT Evaluation $Initial PT Evaluation Tier I: 1 Procedure PT Treatments $Therapeutic Exercise: 8-22 mins   PT G Codes:        Vuk Skillern Dec 25, 2014, 12:32 PM

## 2014-12-24 NOTE — Evaluation (Signed)
Occupational Therapy Evaluation Patient Details Name: Virginia Bullock MRN: [id number] DOB: 31-Aug-1928 Today's Date: 12/24/2014    History of Present Illness Pt is s/p L THA-DA   Clinical Impression   Pt is planning SNF at d/c. She needed min guard to min assist with ADL today and min cues for overall safety. She will benefit from acute OT to progress ADL independence for next venue.    Follow Up Recommendations  SNF;Supervision/Assistance - 24 hour    Equipment Recommendations  None recommended by OT (defer to next venue)    Recommendations for Other Services       Precautions / Restrictions Precautions Precautions: None Restrictions Weight Bearing Restrictions: No Other Position/Activity Restrictions: WBAT      Mobility Bed Mobility               General bed mobility comments: up in chair.   Transfers Overall transfer level: Needs assistance Equipment used: Rolling walker (2 wheeled) Transfers: Sit to/from Stand Sit to Stand: Min guard         General transfer comment: min guard for sit to stand with armrest of chair/grab bar but min assist to safely descend to commode. cues for hand placement.    Balance                                            ADL Overall ADL's : Needs assistance/impaired Eating/Feeding: Independent;Sitting   Grooming: Wash/dry hands;Min guard;Standing   Upper Body Bathing: Set up;Sitting   Lower Body Bathing: Minimal assistance;Sit to/from stand   Upper Body Dressing : Set up;Sitting   Lower Body Dressing: Minimal assistance;Sit to/from stand   Toilet Transfer: Minimal assistance;Ambulation;Comfort height toilet;Grab bars;RW   Toileting- Clothing Manipulation and Hygiene: Minimal assistance;Sit to/from stand         General ADL Comments: Pt attempting to sit on commode without reaching for grab bar and had slight LOB needing min assist--cues to use grab bar to help safely descend to commode. Pt  states she was able to don LB clothing with last hip surgery without AE by the time she went home from rehab. Pt attempted to don/doff L sock and able to flex hip to bring LE up to her but then stating too sore to continue with trying to don/doff sock today. Pt needed min cues for proper walker distance to self and for posture.      Vision     Perception     Praxis      Pertinent Vitals/Pain Pain Assessment: 0-10 Pain Location: "its there." pt didnt rate. L hip Pain Descriptors / Indicators: Sore Pain Intervention(s): Repositioned     Hand Dominance     Extremity/Trunk Assessment Upper Extremity Assessment Upper Extremity Assessment: Overall WFL for tasks assessed           Communication Communication Communication: No difficulties   Cognition Arousal/Alertness: Awake/alert Behavior During Therapy: WFL for tasks assessed/performed Overall Cognitive Status: Within Functional Limits for tasks assessed                     General Comments       Exercises       Shoulder Instructions      Home Living Family/patient expects to be discharged to:: Skilled nursing facility Living Arrangements: Alone  Prior Functioning/Environment Level of Independence: Independent             OT Diagnosis: Generalized weakness   OT Problem List: Decreased strength;Decreased knowledge of use of DME or AE   OT Treatment/Interventions: Self-care/ADL training;Patient/family education;Therapeutic activities;DME and/or AE instruction    OT Goals(Current goals can be found in the care plan section) Acute Rehab OT Goals Patient Stated Goal: home to cook for Christmas. OT Goal Formulation: With patient Time For Goal Achievement: 12/31/14 Potential to Achieve Goals: Good  OT Frequency: Min 2X/week   Barriers to D/C:            Co-evaluation              End of Session Equipment Utilized During Treatment: Rolling  walker  Activity Tolerance: Patient tolerated treatment well Patient left: in chair;with call bell/phone within reach   Time: 1100-1125 OT Time Calculation (min): 25 min Charges:  OT General Charges $OT Visit: 1 Procedure OT Evaluation $Initial OT Evaluation Tier I: 1 Procedure G-Codes:    Jules Schick  [id number] 12/24/2014, 11:37 AM

## 2014-12-24 NOTE — Discharge Instructions (Addendum)
° °Dr. Frank Aluisio °Total Joint Specialist °[facility] °[address] °[place], [place] [address] °[phone] ° °ANTERIOR APPROACH TOTAL HIP REPLACEMENT POSTOPERATIVE DIRECTIONS ° ° °Hip Rehabilitation, Guidelines Following Surgery  °The results of a hip operation are greatly improved after range of motion and muscle strengthening exercises. Follow all safety measures which are given to protect your hip. If any of these exercises cause increased pain or swelling in your joint, decrease the amount until you are comfortable again. Then slowly increase the exercises. Call your caregiver if you have problems or questions.  ° °HOME CARE INSTRUCTIONS  °Remove items at home which could result in a fall. This includes throw rugs or furniture in walking pathways.  °· ICE to the affected hip every three hours for 30 minutes at a time and then as needed for pain and swelling.  Continue to use ice on the hip for pain and swelling from surgery. You may notice swelling that will progress down to the foot and ankle.  This is normal after surgery.  Elevate the leg when you are not up walking on it.   °· Continue to use the breathing machine which will help keep your temperature down.  It is common for your temperature to cycle up and down following surgery, especially at night when you are not up moving around and exerting yourself.  The breathing machine keeps your lungs expanded and your temperature down. ° ° °DIET °You may resume your previous home diet once your are discharged from the hospital. ° °DRESSING / WOUND CARE / SHOWERING °You may shower 3 days after surgery, but keep the wounds dry during showering.  You may use an occlusive plastic wrap (Press'n Seal for example), NO SOAKING/SUBMERGING IN THE BATHTUB.  If the bandage gets wet, change with a clean dry gauze.  If the incision gets wet, pat the wound dry with a clean towel. °You may start showering once you are discharged home but do not  submerge the incision under water. Just pat the incision dry and apply a dry gauze dressing on daily. °Change the surgical dressing daily and reapply a dry dressing each time. ° °ACTIVITY °Walk with your walker as instructed. °Use walker as long as suggested by your caregivers. °Avoid periods of inactivity such as sitting longer than an hour when not asleep. This helps prevent blood clots.  °You may resume a sexual relationship in one month or when given the OK by your doctor.  °You may return to work once you are cleared by your doctor.  °Do not drive a car for 6 weeks or until released by you surgeon.  °Do not drive while taking narcotics. ° °WEIGHT BEARING °Weight bearing as tolerated with assist device (walker, cane, etc) as directed, use it as long as suggested by your surgeon or therapist, typically at least 4-6 weeks. ° °POSTOPERATIVE CONSTIPATION PROTOCOL °Constipation - defined medically as fewer than three stools per week and severe constipation as less than one stool per week. ° °One of the most common issues patients have following surgery is constipation.  Even if you have a regular bowel pattern at home, your normal regimen is likely to be disrupted due to multiple reasons following surgery.  Combination of anesthesia, postoperative narcotics, change in appetite and fluid intake all can affect your bowels.  In order to avoid complications following surgery, here are some recommendations in order to help you during your recovery period. ° °Colace (docusate) - Pick up an over-the-counter   form of Colace or another stool softener and take twice a day as long as you are requiring postoperative pain medications.  Take with a full glass of water daily.  If you experience loose stools or diarrhea, hold the colace until you stool forms back up.  If your symptoms do not get better within 1 week or if they get worse, check with your doctor. ° °Dulcolax (bisacodyl) - Pick up over-the-counter and take as directed  by the product packaging as needed to assist with the movement of your bowels.  Take with a full glass of water.  Use this product as needed if not relieved by Colace only.  ° °MiraLax (polyethylene glycol) - Pick up over-the-counter to have on hand.  MiraLax is a solution that will increase the amount of water in your bowels to assist with bowel movements.  Take as directed and can mix with a glass of water, juice, soda, coffee, or tea.  Take if you go more than two days without a movement. °Do not use MiraLax more than once per day. Call your doctor if you are still constipated or irregular after using this medication for 7 days in a row. ° °If you continue to have problems with postoperative constipation, please contact the office for further assistance and recommendations.  If you experience "the worst abdominal pain ever" or develop nausea or vomiting, please contact the office immediatly for further recommendations for treatment. ° °ITCHING ° If you experience itching with your medications, try taking only a single pain pill, or even half a pain pill at a time.  You can also use Benadryl over the counter for itching or also to help with sleep.  ° °TED HOSE STOCKINGS °Wear the elastic stockings on both legs for three weeks following surgery during the day but you may remove then at night for sleeping. ° °MEDICATIONS °See your medication summary on the “After Visit Summary” that the nursing staff will review with you prior to discharge.  You may have some home medications which will be placed on hold until you complete the course of blood thinner medication.  It is important for you to complete the blood thinner medication as prescribed by your surgeon.  Continue your approved medications as instructed at time of discharge. ° °PRECAUTIONS °If you experience chest pain or shortness of breath - call 911 immediately for transfer to the hospital emergency department.  °If you develop a fever greater that 101 F,  purulent drainage from wound, increased redness or drainage from wound, foul odor from the wound/dressing, or calf pain - CONTACT YOUR SURGEON.   °                                                °FOLLOW-UP APPOINTMENTS °Make sure you keep all of your appointments after your operation with your surgeon and caregivers. You should call the office at the above phone number and make an appointment for approximately two weeks after the date of your surgery or on the date instructed by your surgeon outlined in the "After Visit Summary". ° °RANGE OF MOTION AND STRENGTHENING EXERCISES  °These exercises are designed to help you keep full movement of your hip joint. Follow your caregiver's or physical therapist's instructions. Perform all exercises about fifteen times, three times per day or as directed. Exercise both hips, even if you   have had only one joint replacement. These exercises can be done on a training (exercise) mat, on the floor, on a table or on a bed. Use whatever works the best and is most comfortable for you. Use music or television while you are exercising so that the exercises are a pleasant break in your day. This will make your life better with the exercises acting as a break in routine you can look forward to.  °Lying on your back, slowly slide your foot toward your buttocks, raising your knee up off the floor. Then slowly slide your foot back down until your leg is straight again.  °Lying on your back spread your legs as far apart as you can without causing discomfort.  °Lying on your side, raise your upper leg and foot straight up from the floor as far as is comfortable. Slowly lower the leg and repeat.  °Lying on your back, tighten up the muscle in the front of your thigh (quadriceps muscles). You can do this by keeping your leg straight and trying to raise your heel off the floor. This helps strengthen the largest muscle supporting your knee.  °Lying on your back, tighten up the muscles of your  buttocks both with the legs straight and with the knee bent at a comfortable angle while keeping your heel on the floor.  ° °IF YOU ARE TRANSFERRED TO A SKILLED REHAB FACILITY °If the patient is transferred to a skilled rehab facility following release from the hospital, a list of the current medications will be sent to the facility for the patient to continue.  When discharged from the skilled rehab facility, please have the facility set up the patient's Home Health Physical Therapy prior to being released. Also, the skilled facility will be responsible for providing the patient with their medications at time of release from the facility to include their pain medication, the muscle relaxants, and their blood thinner medication. If the patient is still at the rehab facility at time of the two week follow up appointment, the skilled rehab facility will also need to assist the patient in arranging follow up appointment in our office and any transportation needs. ° °MAKE SURE YOU:  °Understand these instructions.  °Get help right away if you are not doing well or get worse.  ° ° °Pick up stool softner and laxative for home use following surgery while on pain medications. °Do not submerge incision under water. °Please use good hand washing techniques while changing dressing each day. °May shower starting three days after surgery. °Please use a clean towel to pat the incision dry following showers. °Continue to use ice for pain and swelling after surgery. °Do not use any lotions or creams on the incision until instructed by your surgeon. ° °Take Xarelto for two and a half more weeks, then discontinue Xarelto. °Once the patient has completed the blood thinner regimen, then take a Baby 81 mg Aspirin daily for three more weeks. ° ° °Information on my medicine - XARELTO® (Rivaroxaban) ° °This medication education was reviewed with me or my healthcare representative as part of my discharge preparation.  The pharmacist that  spoke with me during my hospital stay was:  Christine ° °Why was Xarelto® prescribed for you? °Xarelto® was prescribed for you to reduce the risk of blood clots forming after orthopedic surgery. The medical term for these abnormal blood clots is venous thromboembolism (VTE). ° °What do you need to know about xarelto® ? °Take your Xarelto® ONCE DAILY at   the same time every day. You may take it either with or without food.  If you have difficulty swallowing the tablet whole, you may crush it and mix in applesauce just prior to taking your dose.  Take Xarelto exactly as prescribed by your doctor and DO NOT stop taking Xarelto without talking to the doctor who prescribed the medication.  Stopping without other VTE prevention medication to take the place of Xarelto may increase your risk of developing a clot.  After discharge, you should have regular check-up appointments with your healthcare provider that is prescribing your Xarelto.    What do you do if you miss a dose? If you miss a dose, take it as soon as you remember on the same day then continue your regularly scheduled once daily regimen the next day. Do not take two doses of Xarelto on the same day.   Important Safety Information A possible side effect of Xarelto is bleeding. You should call your healthcare provider right away if you experience any of the following: ? Bleeding from an injury or your nose that does not stop. ? Unusual colored urine (red or dark brown) or unusual colored stools (red or black). ? Unusual bruising for unknown reasons. ? A serious fall or if you hit your head (even if there is no bleeding).  Some medicines may interact with Xarelto and might increase your risk of bleeding while on Xarelto. To help avoid this, consult your healthcare provider or pharmacist prior to using any new prescription or non-prescription medications, including herbals, vitamins, non-steroidal anti-inflammatory drugs (NSAIDs) and  supplements.  This website has more information on Xarelto: [email or web address]

## 2014-12-24 NOTE — Progress Notes (Signed)
Physical Therapy Treatment Patient Details Name: Virginia Bullock MRN: [id number] DOB: [birth year] Today's Date: 16-Jan-2015    History of Present Illness Pt is s/p L THA-DA    PT Comments    Pt progressing well with mobility and eager for dc to [place] to continue rehab  Follow Up Recommendations  SNF     Equipment Recommendations  None recommended by PT    Recommendations for Other Services OT consult     Precautions / Restrictions Precautions Precautions: Fall Restrictions Weight Bearing Restrictions: No Other Position/Activity Restrictions: WBAT    Mobility  Bed Mobility                  Transfers Overall transfer level: Needs assistance Equipment used: Rolling walker (2 wheeled) Transfers: Sit to/from Stand Sit to Stand: Min guard         General transfer comment: cues for LE management and use of UEs to self assist  Ambulation/Gait Ambulation/Gait assistance: Min assist;Min guard Ambulation Distance (Feet): 400 Feet Assistive device: Rolling walker (2 wheeled) Gait Pattern/deviations: Decreased step length - right;Decreased step length - left;Step-to pattern;Step-through pattern;Shuffle;Trunk flexed Gait velocity: decr   General Gait Details: cues for posture, position from RW and initial sequence   Stairs            Wheelchair Mobility    Modified Rankin (Stroke Patients Only)       Balance                                    Cognition Arousal/Alertness: Awake/alert Behavior During Therapy: WFL for tasks assessed/performed Overall Cognitive Status: Within Functional Limits for tasks assessed                      Exercises      General Comments        Pertinent Vitals/Pain Pain Assessment: 0-10 Pain Score: 4  Pain Location: L hip/thigh Pain Descriptors / Indicators: Aching;Sore Pain Intervention(s): Limited activity within patient's tolerance;Monitored during session;Premedicated before  session;Ice applied    Home Living                      Prior Function            PT Goals (current goals can now be found in the care plan section) Acute Rehab PT Goals Patient Stated Goal: home to cook for Christmas. PT Goal Formulation: With patient Potential to Achieve Goals: Good Progress towards PT goals: Progressing toward goals    Frequency  7X/week    PT Plan Current plan remains appropriate    Co-evaluation             End of Session Equipment Utilized During Treatment: Gait belt Activity Tolerance: Patient tolerated treatment well Patient left: in chair;with call bell/phone within reach     Time: [id number] PT Time Calculation (min) (ACUTE ONLY): 28 min  Charges:  $Gait Training: 23-37 mins                    G Codes:      Virginia Bullock Jan 16, 2015, 4:49 PM

## 2014-12-24 NOTE — Progress Notes (Signed)
   Subjective: 1 Day Post-Op Procedure(s) (LRB): LEFT TOTAL HIP ARTHROPLASTY ANTERIOR APPROACH (Left) Patient reports pain as mild.   Patient seen in rounds by Dr. Wynelle Link. Patient is well, but has had some minor complaints of pain in the hip, requiring pain medications We will start therapy today.  Plan is to go Skilled nursing facility after hospital stay.  Objective: Vital signs in last 24 hours: Temp:  [97.3 F (36.3 C)-98 F (36.7 C)] 98 F (36.7 C) (12/15 0650) Pulse Rate:  [61-77] 64 (12/15 0650) Resp:  [11-28] 16 (12/15 0650) BP: (104-168)/(54-94) 108/60 mmHg (12/15 0650) SpO2:  [93 %-100 %] 97 % (12/15 0650) Weight:  [59.421 kg (131 lb)-59.818 kg (131 lb 14 oz)] 59.421 kg (131 lb) (12/14 1520)  Intake/Output from previous day:  Intake/Output Summary (Last 24 hours) at 12/24/14 0835 Last data filed at 12/24/14 [id number]  Gross per 24 hour  Intake 3773.75 ml  Output   2580 ml  Net 1193.75 ml    Intake/Output this shift: Total I/O In: 240 [P.O.:240] Out: -   Labs:  Recent Labs  12/24/14 0440  HGB 10.1*    Recent Labs  12/24/14 0440  WBC 11.6*  RBC 3.16*  HCT 30.8*  PLT 201    Recent Labs  12/24/14 0440  NA 139  K 4.6  CL 108  CO2 24  BUN 14  CREATININE 0.74  GLUCOSE 184*  CALCIUM 8.3*   No results for input(s): LABPT, INR in the last 72 hours.  EXAM General - Patient is Alert and Appropriate Extremity - Neurovascular intact Sensation intact distally Dorsiflexion/Plantar flexion intact Dressing - dressing C/D/I Motor Function - intact, moving foot and toes well on exam.  Hemovac pulled without difficulty.  Past Medical History  Diagnosis Date  . History of shingles     x2  . Post-nasal drip   . Hypothyroidism   . Basal cell carcinoma of skin   . Diverticulosis of colon without hemorrhage 02/27/2014  . Loss of hearing 02/27/2014    both  . Osteopenia 02/27/2014  . History of herpes zoster 02/27/2014  . Goiter   . Migraine headache  02/27/2014    migraines, aura without pain  . Arthritis     right hip  . OA (osteoarthritis) of hip 02/20/2014  . Impaired hearing   . Complication of anesthesia     "epidural with 5th baby-stopped breathing and went numb, 'died'"    Assessment/Plan: 1 Day Post-Op Procedure(s) (LRB): LEFT TOTAL HIP ARTHROPLASTY ANTERIOR APPROACH (Left) Active Problems:   OA (osteoarthritis) of hip  Estimated body mass index is 23.95 kg/(m^2) as calculated from the following:   Height as of this encounter: 5\' 2"  (1.575 m).   Weight as of this encounter: 59.421 kg (131 lb). Advance diet Up with therapy Plan for discharge tomorrow Discharge to SNF  DVT Prophylaxis - Xarelto Weight Bearing As Tolerated left Leg Hemovac Pulled Begin Therapy  Arlee Muslim, PA-C Orthopaedic Surgery 12/24/2014, 8:35 AM

## 2014-12-24 NOTE — Clinical Social Work Placement (Signed)
   CLINICAL SOCIAL WORK PLACEMENT  NOTE  Date:  12/24/2014  Patient Details  Name: TRINKA HASTEY MRN: [id number] Date of Birth: 03/04/1928  Clinical Social Work is seeking post-discharge placement for this patient at the [place] level of care (*CSW will initial, date and re-position this form in  chart as items are completed):  No   Patient/family provided with [facility]'s list of facilities offering this level of care within the geographic area requested by the patient (or if unable, by the patient's family).  Yes   Patient/family informed of their freedom to choose among providers that offer the needed level of care, that participate in Medicare, Medicaid or managed care program needed by the patient, have an available bed and are willing to accept the patient.  No   Patient/family informed of [facility]'s ownership interest in [facility] and [facility], as well as of the fact that they are under no obligation to receive care at these facilities.  PASRR submitted to EDS on       PASRR number received on       Existing PASRR number confirmed on 12/24/14     FL2 transmitted to all facilities in geographic area requested by pt/family on 12/24/14     FL2 transmitted to all facilities within larger geographic area on       Patient informed that his/her managed care company has contracts with or will negotiate with certain facilities, including the following:        Yes   Patient/family informed of bed offers received.  Patient chooses bed at [facility]     Physician recommends and patient chooses bed at [facility]    Patient to be transferred to [facility] on  .  Patient to be transferred to facility by       Patient family notified on   of transfer.  Name of family member notified:        PHYSICIAN       Additional Comment:    _______________________________________________ Luretha Rued,  [place] 12/24/2014, 2:16 PM

## 2014-12-24 NOTE — Clinical Social Work Note (Signed)
Clinical Social Work Assessment  Patient Details  Name: Virginia Bullock MRN: [id number] Date of Birth: 03-06-1928  Date of referral:  12/24/14               Reason for consult:  Facility Placement, Discharge Planning                Permission sought to share information with:  Chartered certified accountant granted to share information::  Yes, Verbal Permission Granted  Name::        Agency::     Relationship::     Contact Information:     Housing/Transportation Living arrangements for the past 2 months:  Single Family Home Source of Information:  Patient Patient Interpreter Needed:  None Criminal Activity/Legal Involvement Pertinent to Current Situation/Hospitalization:  No - Comment as needed Significant Relationships:  Adult Children Lives with:  Self Do you feel safe going back to the place where you live?   (ST Rehab is needed.) Need for family participation in patient care:     Care giving concerns:  Pt's care cannot be managed at home following hospital d/c.   Social Worker assessment / plan: Pt hospitalized on 12/23/14 for pre planned left total hip arthroplasty. CSW met with pt to assist with d/c planning. PT has recommended ST Rehab at d/c. Pt has made prior arrangements to have ST Rehab at [facility] at d/c. CSW has contacted SNF and d/c plan as been confirmed. CSW will continue to follow to assist with d/c planning to SNF when stable.  Employment status:  Retired Nurse, adult PT Recommendations:  [place] / Referral to community resources:  [place]  Patient/Family's Response to care:  Pt feels ST Rehab is needed.  Patient/Family's Understanding of and Emotional Response to Diagnosis, Current Treatment, and Prognosis:  Pt is aware of her medical status. She is motivated to work with therapy and is looking forward to having rehab at [facility]. Emotional Assessment Appearance:   Appears stated age Attitude/Demeanor/Rapport:  Other (cooperative) Affect (typically observed):  Calm, Pleasant Orientation:  Oriented to Self, Oriented to Place, Oriented to  Time, Oriented to Situation Alcohol / Substance use:  Not Applicable Psych involvement (Current and /or in the community):  No (Comment)  Discharge Needs  Concerns to be addressed:  Discharge Planning Concerns Readmission within the last 30 days:  No Current discharge risk:  None Barriers to Discharge:  No Barriers Identified   Luretha Rued, [place] 12/24/2014, 2:11 PM

## 2014-12-25 LAB — CBC
HCT: 28.3 % — ABNORMAL LOW (ref 36.0–46.0)
Hemoglobin: 9.6 g/dL — ABNORMAL LOW (ref 12.0–15.0)
MCH: 32.7 pg (ref 26.0–34.0)
MCHC: 33.9 g/dL (ref 30.0–36.0)
MCV: 96.3 fL (ref 78.0–100.0)
PLATELETS: 172 10*3/uL (ref 150–400)
RBC: 2.94 MIL/uL — ABNORMAL LOW (ref 3.87–5.11)
RDW: 13.6 % (ref 11.5–15.5)
WBC: 15.7 10*3/uL — AB (ref 4.0–10.5)

## 2014-12-25 LAB — BASIC METABOLIC PANEL
ANION GAP: 7 (ref 5–15)
BUN: 22 mg/dL — ABNORMAL HIGH (ref 6–20)
CALCIUM: 8.5 mg/dL — AB (ref 8.9–10.3)
CO2: 26 mmol/L (ref 22–32)
Chloride: 108 mmol/L (ref 101–111)
Creatinine, Ser: 0.56 mg/dL (ref 0.44–1.00)
GLUCOSE: 140 mg/dL — AB (ref 65–99)
Potassium: 4.1 mmol/L (ref 3.5–5.1)
Sodium: 141 mmol/L (ref 135–145)

## 2014-12-25 MED ORDER — METOCLOPRAMIDE HCL 5 MG PO TABS: 5.0000 mg | ORAL_TABLET | Freq: Three times a day (TID) | ORAL | Status: DC | PRN

## 2014-12-25 MED ORDER — HYDROMORPHONE HCL 2 MG PO TABS: 2.0000 mg | ORAL_TABLET | ORAL | Status: DC | PRN

## 2014-12-25 MED ORDER — FLEET ENEMA 7-19 GM/118ML RE ENEM: 1.0000 | ENEMA | Freq: Once | RECTAL | Status: DC | PRN

## 2014-12-25 MED ORDER — METHOCARBAMOL 500 MG PO TABS: 500.0000 mg | ORAL_TABLET | Freq: Four times a day (QID) | ORAL | Status: DC | PRN

## 2014-12-25 MED ORDER — TRAMADOL HCL 50 MG PO TABS: 50.0000 mg | ORAL_TABLET | Freq: Four times a day (QID) | ORAL | Status: DC | PRN

## 2014-12-25 MED ORDER — DOCUSATE SODIUM 100 MG PO CAPS: 100.0000 mg | ORAL_CAPSULE | Freq: Two times a day (BID) | ORAL | Status: DC

## 2014-12-25 MED ORDER — ONDANSETRON HCL 4 MG PO TABS: 4.0000 mg | ORAL_TABLET | Freq: Four times a day (QID) | ORAL | Status: DC | PRN

## 2014-12-25 MED ORDER — RIVAROXABAN 10 MG PO TABS: 10.0000 mg | ORAL_TABLET | Freq: Every day | ORAL | Status: DC

## 2014-12-25 MED ORDER — POLYETHYLENE GLYCOL 3350 17 G PO PACK: 17.0000 g | PACK | Freq: Every day | ORAL | Status: DC | PRN

## 2014-12-25 MED ORDER — BISACODYL 10 MG RE SUPP: 10.0000 mg | Freq: Every day | RECTAL | Status: DC | PRN

## 2014-12-25 NOTE — Progress Notes (Signed)
Physical Therapy Treatment Patient Details Name: Virginia Bullock MRN: [id number] DOB: Sep 06, 1928 Today's Date: 12/25/2014    History of Present Illness Pt is s/p L THA-DA    PT Comments    POD # 2 Pt dressed and eager to D/C to [facility] today.  Assisted with amb in hallway.  Tx session shorten by lunch tray arrival.    Follow Up Recommendations  SNF     Equipment Recommendations  None recommended by PT    Recommendations for Other Services       Precautions / Restrictions Precautions Precautions: Fall Restrictions Weight Bearing Restrictions: No Other Position/Activity Restrictions: WBAT    Mobility  Bed Mobility               General bed mobility comments: Pt OOB in recliner  Transfers Overall transfer level: Needs assistance Equipment used: Rolling walker (2 wheeled) Transfers: Sit to/from Stand Sit to Stand: Min guard         General transfer comment: cues for LE management and use of UEs to self assist.  VC's safety esp with turns.   Ambulation/Gait Ambulation/Gait assistance: Min guard;Min assist Ambulation Distance (Feet): 285 Feet Assistive device: Rolling walker (2 wheeled) Gait Pattern/deviations: Step-to pattern;Decreased step length - right;Decreased step length - left;Decreased stance time - left;Trunk flexed Gait velocity: decr   General Gait Details: cues for posture, position from RW and initial sequence   Stairs            Wheelchair Mobility    Modified Rankin (Stroke Patients Only)       Balance                                    Cognition Arousal/Alertness: Awake/alert Behavior During Therapy: WFL for tasks assessed/performed Overall Cognitive Status: Within Functional Limits for tasks assessed                      Exercises      General Comments        Pertinent Vitals/Pain Pain Assessment: 0-10 Pain Score: 5  Pain Location: L hip Pain Descriptors / Indicators: Aching;Sore Pain  Intervention(s): Monitored during session;Premedicated before session;Repositioned    Home Living                      Prior Function            PT Goals (current goals can now be found in the care plan section) Progress towards PT goals: Progressing toward goals    Frequency  7X/week    PT Plan Current plan remains appropriate    Co-evaluation             End of Session Equipment Utilized During Treatment: Gait belt Activity Tolerance: Patient tolerated treatment well Patient left: in chair;with call bell/phone within reach     Time: 1125-1140 PT Time Calculation (min) (ACUTE ONLY): 15 min  Charges:  $Gait Training: 8-22 mins                    G Codes:      Rica Koyanagi  PTA [facility]  Acute  Rehab Pager      [phone]

## 2014-12-25 NOTE — Clinical Social Work Placement (Signed)
   CLINICAL SOCIAL WORK PLACEMENT  NOTE  Date:  12/25/2014  Patient Details  Name: Virginia Bullock MRN: [id number] Date of Birth: 01/02/1929  Clinical Social Work is seeking post-discharge placement for this patient at the [place] level of care (*CSW will initial, date and re-position this form in  chart as items are completed):  No   Patient/family provided with [place] Work Department's list of facilities offering this level of care within the geographic area requested by the patient (or if unable, by the patient's family).  Yes   Patient/family informed of their freedom to choose among providers that offer the needed level of care, that participate in Medicare, Medicaid or managed care program needed by the patient, have an available bed and are willing to accept the patient.  No   Patient/family informed of [facility]'s ownership interest in [facility] and [facility], as well as of the fact that they are under no obligation to receive care at these facilities.  PASRR submitted to EDS on       PASRR number received on       Existing PASRR number confirmed on 12/24/14     FL2 transmitted to all facilities in geographic area requested by pt/family on 12/24/14     FL2 transmitted to all facilities within larger geographic area on       Patient informed that his/her managed care company has contracts with or will negotiate with certain facilities, including the following:        Yes   Patient/family informed of bed offers received.  Patient [facility] bed at [facility]     Physician recommends and patient chooses bed at      Patient to be transferred to [facility] on 12/25/14.  Patient to be transferred to facility by [facility]     Patient family notified on 12/25/14 of transfer.  Name of family member notified:  SON     PHYSICIAN       Additional Comment: Pt has decided to go to [facility] instead of [facility] due to  [place] not having a pvt room. PT has approved transport by car. NSG has reviewed d/c summary, scripts, avs. Scripts included in d/c packet. D/C Summary sent to SNF for review prior to D/C. D/C packet provided to pt prior to d/c.   _______________________________________________ Luretha Rued, [place] 12/25/2014, 2:17 PM

## 2014-12-25 NOTE — Progress Notes (Signed)
   Subjective: 2 Days Post-Op Procedure(s) (LRB): LEFT TOTAL HIP ARTHROPLASTY ANTERIOR APPROACH (Left) Patient reports pain as mild.   Patient seen in rounds with Dr. Wynelle Link. Patient is well, and has had no acute complaints or problems.  She is sitting up in bed eating breakfast on rounds. Patient is ready to go to [facility].  Objective: Vital signs in last 24 hours: Temp:  [97.8 F (36.6 C)-98.7 F (37.1 C)] 98.2 F (36.8 C) (12/16 0448) Pulse Rate:  [51-75] 69 (12/16 0448) Resp:  [15-16] 16 (12/16 0448) BP: (102-128)/(52-74) 128/68 mmHg (12/16 0448) SpO2:  [96 %-100 %] 96 % (12/16 0448)  Intake/Output from previous day:  Intake/Output Summary (Last 24 hours) at 12/25/14 0817 Last data filed at 12/24/14 2200  Gross per 24 hour  Intake    840 ml  Output   1250 ml  Net   -410 ml   Labs:  Recent Labs  12/24/14 0440 12/25/14 0448  HGB 10.1* 9.6*    Recent Labs  12/24/14 0440 12/25/14 0448  WBC 11.6* 15.7*  RBC 3.16* 2.94*  HCT 30.8* 28.3*  PLT 201 172    Recent Labs  12/24/14 0440 12/25/14 0448  NA 139 141  K 4.6 4.1  CL 108 108  CO2 24 26  BUN 14 22*  CREATININE 0.74 0.56  GLUCOSE 184* 140*  CALCIUM 8.3* 8.5*   No results for input(s): LABPT, INR in the last 72 hours.  EXAM: General - Patient is Alert, Appropriate and Oriented Extremity - Neurovascular intact Sensation intact distally Dorsiflexion/Plantar flexion intact Incision - clean, dry, no drainage Motor Function - intact, moving foot and toes well on exam.   Assessment/Plan: 2 Days Post-Op Procedure(s) (LRB): LEFT TOTAL HIP ARTHROPLASTY ANTERIOR APPROACH (Left) Procedure(s) (LRB): LEFT TOTAL HIP ARTHROPLASTY ANTERIOR APPROACH (Left) Past Medical History  Diagnosis Date  . History of shingles     x2  . Post-nasal drip   . Hypothyroidism   . Basal cell carcinoma of skin   . Diverticulosis of colon without hemorrhage 02/27/2014  . Loss of hearing 02/27/2014    both  .  Osteopenia 02/27/2014  . History of herpes zoster 02/27/2014  . Goiter   . Migraine headache 02/27/2014    migraines, aura without pain  . Arthritis     right hip  . OA (osteoarthritis) of hip 02/20/2014  . Impaired hearing   . Complication of anesthesia     "epidural with 5th baby-stopped breathing and went numb, 'died'"   Active Problems:   OA (osteoarthritis) of hip  Estimated body mass index is 23.95 kg/(m^2) as calculated from the following:   Height as of this encounter: 5\' 2"  (1.575 m).   Weight as of this encounter: 59.421 kg (131 lb). Up with therapy Discharge to SNF Diet - Regular diet Follow up - in 2 weeks Activity - WBAT Disposition - Skilled nursing facility Condition Upon Discharge - Stable D/C Meds - See DC Summary DVT Prophylaxis - Xarelto  Arlee Muslim, PA-C Orthopaedic Surgery 12/25/2014, 8:17 AM

## 2014-12-25 NOTE — Progress Notes (Signed)
Pt d/c to [facility]. Report given to nurse at the facility. Pt being transported to facility via car by son. D/c packet given to patient.

## 2014-12-25 NOTE — Discharge Summary (Signed)
Physician Discharge Summary   Patient ID: Virginia Bullock MRN: [id number] DOB/AGE: [birth year] 81 y.o.  Admit date: 12/23/2014 Discharge date: 12-25-2014  Primary Diagnosis:  Osteoarthritis of the Left hip.   Admission Diagnoses:  Past Medical History  Diagnosis Date  . History of shingles     x2  . Post-nasal drip   . Hypothyroidism   . Basal cell carcinoma of skin   . Diverticulosis of colon without hemorrhage 02/27/2014  . Loss of hearing 02/27/2014    both  . Osteopenia 02/27/2014  . History of herpes zoster 02/27/2014  . Goiter   . Migraine headache 02/27/2014    migraines, aura without pain  . Arthritis     right hip  . OA (osteoarthritis) of hip 02/20/2014  . Impaired hearing   . Complication of anesthesia     "epidural with 5th baby-stopped breathing and went numb, 'died'"   Discharge Diagnoses:   Active Problems:   OA (osteoarthritis) of hip  Estimated body mass index is 23.95 kg/(m^2) as calculated from the following:   Height as of this encounter: _0  (1.575 m).   Weight as of this encounter: 59.421 kg (131 lb).  Procedure(s) (LRB): LEFT TOTAL HIP ARTHROPLASTY ANTERIOR APPROACH (Left)   Consults: None  HPI: Virginia Bullock is a 81 y.o. female who has advanced end-  stage arthritis of her Left hip with progressively worsening pain and  dysfunction.The patient has failed nonoperative management and presents for  total hip arthroplasty.  Laboratory Data: Admission on 12/23/2014  Component Date Value Ref Range Status  . WBC 12/24/2014 11.6* 4.0 - 10.5 K/uL Final  . RBC 12/24/2014 3.16* 3.87 - 5.11 MIL/uL Final  . Hemoglobin 12/24/2014 10.1* 12.0 - 15.0 g/dL Final  . HCT 12/24/2014 30.8* 36.0 - 46.0 % Final  . MCV 12/24/2014 97.5  78.0 - 100.0 fL Final  . MCH 12/24/2014 32.0  26.0 - 34.0 pg Final  . MCHC 12/24/2014 32.8  30.0 - 36.0 g/dL Final  . RDW 12/24/2014 13.5  11.5 - 15.5 % Final  . Platelets 12/24/2014 201  150 - 400 K/uL Final  . Sodium  12/24/2014 139  135 - 145 mmol/L Final  . Potassium 12/24/2014 4.6  3.5 - 5.1 mmol/L Final  . Chloride 12/24/2014 108  101 - 111 mmol/L Final  . CO2 12/24/2014 24  22 - 32 mmol/L Final  . Glucose, Bld 12/24/2014 184* 65 - 99 mg/dL Final  . BUN 12/24/2014 14  6 - 20 mg/dL Final  . Creatinine, Ser 12/24/2014 0.74  0.44 - 1.00 mg/dL Final  . Calcium 12/24/2014 8.3* 8.9 - 10.3 mg/dL Final  . GFR calc non Af Amer 12/24/2014 >60  >60 mL/min Final  . GFR calc Af Amer 12/24/2014 >60  >60 mL/min Final   Comment: (NOTE) The eGFR has been calculated using the CKD EPI equation. This calculation has not been validated in all clinical situations. eGFR's persistently <60 mL/min signify possible Chronic Kidney Disease.   . Anion gap 12/24/2014 7  5 - 15 Final  . WBC 12/25/2014 15.7* 4.0 - 10.5 K/uL Final  . RBC 12/25/2014 2.94* 3.87 - 5.11 MIL/uL Final  . Hemoglobin 12/25/2014 9.6* 12.0 - 15.0 g/dL Final  . HCT 12/25/2014 28.3* 36.0 - 46.0 % Final  . MCV 12/25/2014 96.3  78.0 - 100.0 fL Final  . MCH 12/25/2014 32.7  26.0 - 34.0 pg Final  . MCHC 12/25/2014 33.9  30.0 - 36.0 g/dL Final  . RDW  12/25/2014 13.6  11.5 - 15.5 % Final  . Platelets 12/25/2014 172  150 - 400 K/uL Final  . Sodium 12/25/2014 141  135 - 145 mmol/L Final  . Potassium 12/25/2014 4.1  3.5 - 5.1 mmol/L Final  . Chloride 12/25/2014 108  101 - 111 mmol/L Final  . CO2 12/25/2014 26  22 - 32 mmol/L Final  . Glucose, Bld 12/25/2014 140* 65 - 99 mg/dL Final  . BUN 12/25/2014 22* 6 - 20 mg/dL Final  . Creatinine, Ser 12/25/2014 0.56  0.44 - 1.00 mg/dL Final  . Calcium 12/25/2014 8.5* 8.9 - 10.3 mg/dL Final  . GFR calc non Af Amer 12/25/2014 >60  >60 mL/min Final  . GFR calc Af Amer 12/25/2014 >60  >60 mL/min Final   Comment: (NOTE) The eGFR has been calculated using the CKD EPI equation. This calculation has not been validated in all clinical situations. eGFR's persistently <60 mL/min signify possible Chronic Kidney Disease.   Georgiann Hahn gap 12/25/2014 7  5 - 15 Final  Hospital Outpatient Visit on 12/17/2014  Component Date Value Ref Range Status  . aPTT 12/17/2014 30  24 - 37 seconds Final  . WBC 12/17/2014 8.0  4.0 - 10.5 K/uL Final  . RBC 12/17/2014 4.11  3.87 - 5.11 MIL/uL Final  . Hemoglobin 12/17/2014 13.1  12.0 - 15.0 g/dL Final  . HCT 12/17/2014 40.0  36.0 - 46.0 % Final  . MCV 12/17/2014 97.3  78.0 - 100.0 fL Final  . MCH 12/17/2014 31.9  26.0 - 34.0 pg Final  . MCHC 12/17/2014 32.8  30.0 - 36.0 g/dL Final  . RDW 12/17/2014 13.4  11.5 - 15.5 % Final  . Platelets 12/17/2014 250  150 - 400 K/uL Final  . Sodium 12/17/2014 140  135 - 145 mmol/L Final  . Potassium 12/17/2014 5.2* 3.5 - 5.1 mmol/L Final  . Chloride 12/17/2014 107  101 - 111 mmol/L Final  . CO2 12/17/2014 27  22 - 32 mmol/L Final  . Glucose, Bld 12/17/2014 97  65 - 99 mg/dL Final  . BUN 12/17/2014 21* 6 - 20 mg/dL Final  . Creatinine, Ser 12/17/2014 0.76  0.44 - 1.00 mg/dL Final  . Calcium 12/17/2014 9.3  8.9 - 10.3 mg/dL Final  . Total Protein 12/17/2014 7.3  6.5 - 8.1 g/dL Final  . Albumin 12/17/2014 4.1  3.5 - 5.0 g/dL Final  . AST 12/17/2014 29  15 - 41 U/L Final  . ALT 12/17/2014 18  14 - 54 U/L Final  . Alkaline Phosphatase 12/17/2014 85  38 - 126 U/L Final  . Total Bilirubin 12/17/2014 0.7  0.3 - 1.2 mg/dL Final  . GFR calc non Af Amer 12/17/2014 >60  >60 mL/min Final  . GFR calc Af Amer 12/17/2014 >60  >60 mL/min Final   Comment: (NOTE) The eGFR has been calculated using the CKD EPI equation. This calculation has not been validated in all clinical situations. eGFR's persistently <60 mL/min signify possible Chronic Kidney Disease.   . Anion gap 12/17/2014 6  5 - 15 Final  . Prothrombin Time 12/17/2014 14.7  11.6 - 15.2 seconds Final  . INR 12/17/2014 1.13  0.00 - 1.49 Final  . ABO/RH(D) 12/17/2014 O POS   Final  . Antibody Screen 12/17/2014 NEG   Final  . Sample Expiration 12/17/2014 12/26/2014   Final  . Extend sample reason  12/17/2014 NO TRANSFUSIONS OR PREGNANCY IN THE PAST 3 MONTHS   Final  . Color, Urine 12/17/2014  YELLOW  YELLOW Final  . APPearance 12/17/2014 CLEAR  CLEAR Final  . Specific Gravity, Urine 12/17/2014 1.010  1.005 - 1.030 Final  . pH 12/17/2014 7.0  5.0 - 8.0 Final  . Glucose, UA 12/17/2014 NEGATIVE  NEGATIVE mg/dL Final  . Hgb urine dipstick 12/17/2014 NEGATIVE  NEGATIVE Final  . Bilirubin Urine 12/17/2014 NEGATIVE  NEGATIVE Final  . Ketones, ur 12/17/2014 NEGATIVE  NEGATIVE mg/dL Final  . Protein, ur 12/17/2014 NEGATIVE  NEGATIVE mg/dL Final  . Nitrite 12/17/2014 NEGATIVE  NEGATIVE Final  . Leukocytes, UA 12/17/2014 NEGATIVE  NEGATIVE Final   MICROSCOPIC NOT DONE ON URINES WITH NEGATIVE PROTEIN, BLOOD, LEUKOCYTES, NITRITE, OR GLUCOSE <1000 mg/dL.  Marland Kitchen MRSA, PCR 12/17/2014 NEGATIVE  NEGATIVE Final  . Staphylococcus aureus 12/17/2014 NEGATIVE  NEGATIVE Final   Comment:        The Xpert SA Assay (FDA approved for NASAL specimens in patients over 40 years of age), is one component of a comprehensive surveillance program.  Test performance has been validated by [facility] for patients greater than or equal to 7 year old. It is not intended to diagnose infection nor to guide or monitor treatment.      X-Rays:Dg Pelvis Portable  12/23/2014  CLINICAL DATA:  81 year old female status post left hip surgery. Initial encounter. EXAM: PORTABLE PELVIS 1-2 VIEWS COMPARISON:  02/20/2014. FINDINGS: Portable AP view at 1231 hours. New left bipolar hip arthroplasty. Postoperative drain in place. Postoperative changes to the surrounding soft tissues. No unexpected osseous changes. Hardware components appear intact and normally aligned in the AP projection. Degenerative changes at the pubic symphysis. Chronic right hip arthroplasty. IMPRESSION: Left hip bipolar arthroplasty with no adverse features. Electronically Signed   By: Genevie Ann M.D.   On: 12/23/2014 12:51   Dg C-arm 1-60 Min-no  Report  12/23/2014  CLINICAL DATA: surgery C-ARM 1-60 MINUTES Fluoroscopy was utilized by the requesting physician.  No radiographic interpretation.    EKG: Orders placed or performed during the hospital encounter of 02/20/14  . EKG     Hospital Course: Patient was admitted to [facility] and taken to the OR and underwent the above state procedure without complications.  Patient tolerated the procedure well and was later transferred to the recovery room and then to the orthopaedic floor for postoperative care.  They were given PO and IV analgesics for pain control following their surgery.  They were given 24 hours of postoperative antibiotics of  Anti-infectives    Start     Dose/Rate Route Frequency Ordered Stop   12/23/14 1800  ceFAZolin (ANCEF) IVPB 2 g/50 mL premix     2 g 100 mL/hr over 30 Minutes Intravenous Every 8 hours 12/23/14 1525 12/24/14 0202   12/23/14 0850  ceFAZolin (ANCEF) IVPB 2 g/50 mL premix     2 g 100 mL/hr over 30 Minutes Intravenous On call to O.R. 12/23/14 0850 12/23/14 1028     and started on DVT prophylaxis in the form of Xarelto.   PT and OT were ordered for total hip protocol.  The patient was allowed to be WBAT with therapy.  Social worker consulted to assist with placement of the patient. Discharge planning was consulted to help with postop disposition and equipment needs.  Patient had a decent night on the evening of surgery.  They started to get up OOB with therapy on day one.  Hemovac drain was pulled without difficulty.  Continued to work with therapy into day two.  Dressing was changed  on day two and the incision was healing well.  Patient was seen in rounds by Dr. Wynelle Link and was ready to go tot he SNF of choice, [facility].  Discharge to SNF Diet - Regular diet Follow up - in 2 weeks Activity - WBAT Disposition - Skilled nursing facility Condition Upon Discharge - Stable D/C Meds - See DC Summary DVT Prophylaxis - Xarelto  Discharge  Instructions    Call MD / Call 911    Complete by:  As directed   If you experience chest pain or shortness of breath, CALL 911 and be transported to the hospital emergency room.  If you develope a fever above 101 F, pus (white drainage) or increased drainage or redness at the wound, or calf pain, call your surgeon's office.     Change dressing    Complete by:  As directed   You may change your dressing dressing daily with sterile 4 x 4 inch gauze dressing and paper tape.  Do not submerge the incision under water.     Constipation Prevention    Complete by:  As directed   Drink plenty of fluids.  Prune juice may be helpful.  You may use a stool softener, such as Colace (over the counter) 100 mg twice a day.  Use MiraLax (over the counter) for constipation as needed.     Diet general    Complete by:  As directed      Discharge instructions    Complete by:  As directed   Pick up stool softner and laxative for home use following surgery while on pain medications. Do not submerge incision under water. Please use good hand washing techniques while changing dressing each day. May shower starting three days after surgery. Please use a clean towel to pat the incision dry following showers. Continue to use ice for pain and swelling after surgery. Do not use any lotions or creams on the incision until instructed by your surgeon.  Total Hip Protocol.  Take Xarelto for two and a half more weeks, then discontinue Xarelto. Once the patient has completed the blood thinner regimen, then take a Baby 81 mg Aspirin daily for three more weeks.  Postoperative Constipation Protocol  Constipation - defined medically as fewer than three stools per week and severe constipation as less than one stool per week.  One of the most common issues patients have following surgery is constipation.  Even if you have a regular bowel pattern at home, your normal regimen is likely to be disrupted due to multiple reasons  following surgery.  Combination of anesthesia, postoperative narcotics, change in appetite and fluid intake all can affect your bowels.  In order to avoid complications following surgery, here are some recommendations in order to help you during your recovery period.  Colace (docusate) - Pick up an over-the-counter form of Colace or another stool softener and take twice a day as long as you are requiring postoperative pain medications.  Take with a full glass of water daily.  If you experience loose stools or diarrhea, hold the colace until you stool forms back up.  If your symptoms do not get better within 1 week or if they get worse, check with your doctor.  Dulcolax (bisacodyl) - Pick up over-the-counter and take as directed by the product packaging as needed to assist with the movement of your bowels.  Take with a full glass of water.  Use this product as needed if not relieved by Colace only.  MiraLax (polyethylene glycol) - Pick up over-the-counter to have on hand.  MiraLax is a solution that will increase the amount of water in your bowels to assist with bowel movements.  Take as directed and can mix with a glass of water, juice, soda, coffee, or tea.  Take if you go more than two days without a movement. Do not use MiraLax more than once per day. Call your doctor if you are still constipated or irregular after using this medication for 7 days in a row.  If you continue to have problems with postoperative constipation, please contact the office for further assistance and recommendations.  If you experience "the worst abdominal pain ever" or develop nausea or vomiting, please contact the office immediatly for further recommendations for treatment.  When discharged from the skilled rehab facility, please have the facility set up the patient's [place] prior to being released.  Please make sure this gets set up prior to release in order to avoid any lapse of therapy following the  rehab stay.  Also provide the patient with their medications at time of release from the facility to include their pain medication, the muscle relaxants, and their blood thinner medication.  If the patient is still at the rehab facility at time of follow up appointment, please also assist the patient in arranging follow up appointment in our office and any transportation needs. ICE to the affected knee or hip every three hours for 30 minutes at a time and then as needed for pain and swelling.     Do not sit on low chairs, stoools or toilet seats, as it may be difficult to get up from low surfaces    Complete by:  As directed      Driving restrictions    Complete by:  As directed   No driving until released by the physician.     Increase activity slowly as tolerated    Complete by:  As directed      Lifting restrictions    Complete by:  As directed   No lifting until released by the physician.     Patient may shower    Complete by:  As directed   You may shower without a dressing once there is no drainage.  Do not wash over the wound.  If drainage remains, do not shower until drainage stops.     TED hose    Complete by:  As directed   Use stockings (TED hose) for 3 weeks on both leg(s).  You may remove them at night for sleeping.     Weight bearing as tolerated    Complete by:  As directed   Laterality:  left  Extremity:  Lower            Medication List    STOP taking these medications        b complex vitamins tablet     cholecalciferol 1000 UNITS tablet  Commonly known as:  VITAMIN D     naproxen sodium 220 MG tablet  Commonly known as:  ANAPROX     OVER THE COUNTER MEDICATION     OVER THE COUNTER MEDICATION     vitamin C 1000 MG tablet      TAKE these medications        acetaminophen 325 MG tablet  Commonly known as:  TYLENOL  Take 650 mg by mouth every 6 (six) hours as needed.     bisacodyl 10 MG suppository  Commonly known as:  DULCOLAX  Place 1 suppository  (10 mg total) rectally daily as needed for moderate constipation.     docusate sodium 100 MG capsule  Commonly known as:  COLACE  Take 1 capsule (100 mg total) by mouth 2 (two) times daily.     HYDROmorphone 2 MG tablet  Commonly known as:  DILAUDID  Take 1-2 tablets (2-4 mg total) by mouth every 4 (four) hours as needed for moderate pain or severe pain.     Magnesium 250 MG Tabs  Take 250 mg by mouth daily.     methocarbamol 500 MG tablet  Commonly known as:  ROBAXIN  Take 1 tablet (500 mg total) by mouth every 6 (six) hours as needed for muscle spasms.     metoCLOPramide 5 MG tablet  Commonly known as:  REGLAN  Take 1 tablet (5 mg total) by mouth every 8 (eight) hours as needed for nausea (if ondansetron (ZOFRAN) ineffective.).     ondansetron 4 MG tablet  Commonly known as:  ZOFRAN  Take 1 tablet (4 mg total) by mouth every 6 (six) hours as needed for nausea.     polyethylene glycol packet  Commonly known as:  MIRALAX / GLYCOLAX  Take 17 g by mouth daily as needed for mild constipation.     rivaroxaban 10 MG Tabs tablet  Commonly known as:  XARELTO  Take 1 tablet (10 mg total) by mouth daily with breakfast. Take Xarelto for two and a half more weeks, then discontinue Xarelto. Once the patient has completed the blood thinner regimen, then take a Baby 81 mg Aspirin daily for three more weeks.     sodium phosphate 7-19 GM/118ML Enem  Place 133 mLs (1 enema total) rectally once as needed for moderate constipation or severe constipation.     traMADol 50 MG tablet  Commonly known as:  ULTRAM  Take 1-2 tablets (50-100 mg total) by mouth every 6 (six) hours as needed (mild pain).           Follow-up Information    Follow up with Gearlean Alf, MD. Schedule an appointment as soon as possible for a visit in 2 weeks.   Specialty:  Orthopedic Surgery   Why:  Call office ASAP at [phone] to setup an appointment in two weeks with one of the PAs for Dr. Wynelle Link.   Contact  information:   [address] [place] [address] [phone]       Signed: Arlee Muslim, PA-C Orthopaedic Surgery 12/25/2014, 8:26 AM

## 2014-12-28 ENCOUNTER — Non-Acute Institutional Stay (SKILLED_NURSING_FACILITY): Payer: Medicare Other | Admitting: Nurse Practitioner

## 2014-12-28 DIAGNOSIS — D62 Acute posthemorrhagic anemia: Secondary | ICD-10-CM

## 2014-12-28 DIAGNOSIS — M1612 Unilateral primary osteoarthritis, left hip: Secondary | ICD-10-CM | POA: Diagnosis not present

## 2014-12-28 DIAGNOSIS — D72829 Elevated white blood cell count, unspecified: Secondary | ICD-10-CM

## 2014-12-28 DIAGNOSIS — K5901 Slow transit constipation: Secondary | ICD-10-CM | POA: Diagnosis not present

## 2014-12-28 NOTE — Progress Notes (Signed)
Patient ID: Virginia Bullock, female   DOB: 1928/07/20, 81 y.o.   MRN: [id number]    Nursing Home Location:  [place] of Service: SNF (31)  PCP: Vidal Schwalbe, MD  Allergies  Allergen Reactions  . Benadryl [Diphenhydramine Hcl] Anxiety    Panic attack  . Codeine Nausea And Vomiting  . Coumadin [Warfarin Sodium]     Severe rash  . Demerol [Meperidine] Nausea And Vomiting    Chief Complaint  Patient presents with  . Discharge Note    HPI:  Patient is a 81 y.o. female seen today at [facility] for discharge home. Patient at [facility] for short term rehabilitation after hospitalization from 12/23/14-12/25/14 with left hip OA. She underwent left total hip arthroplasty. he has been working with therapy team and doing well. Her pain is under control, no constipation noted. No fevers or chills. Wound healing well. Patient currently doing well with therapy, now stable to discharge home with assistance of family, does not wish to have home health.   Review of Systems:  Review of Systems  Constitutional: Negative for activity change, appetite change, fatigue and unexpected weight change.  HENT: Negative for congestion and hearing loss.   Eyes: Negative.   Respiratory: Negative for cough and shortness of breath.   Cardiovascular: Negative for chest pain, palpitations and leg swelling.  Gastrointestinal: Negative for abdominal pain, diarrhea and constipation.  Genitourinary: Negative for dysuria and difficulty urinating.  Musculoskeletal: Negative for myalgias and arthralgias.  Skin: Negative for color change and wound.  Neurological: Negative for dizziness and weakness.  Psychiatric/Behavioral: Negative for behavioral problems, confusion and agitation.    Past Medical History  Diagnosis Date  . History of shingles     x2  . Post-nasal drip   . Hypothyroidism   . Basal cell carcinoma of skin   . Diverticulosis of colon without  hemorrhage 02/27/2014  . Loss of hearing 02/27/2014    both  . Osteopenia 02/27/2014  . History of herpes zoster 02/27/2014  . Goiter   . Migraine headache 02/27/2014    migraines, aura without pain  . Arthritis     right hip  . OA (osteoarthritis) of hip 02/20/2014  . Impaired hearing   . Complication of anesthesia     "epidural with 5th baby-stopped breathing and went numb, 'died'"   Past Surgical History  Procedure Laterality Date  . Basal cell carcinoma excision    . Mastoidectomy Bilateral     as a baby  . Cervical spine surgery  24 years ago    2 ruptured disc  . Knee arthroscopy Left 30 years ago    "born with knee cap problem"  . Total hip arthroplasty Right 02/20/2014    Procedure: RIGHT TOTAL HIP ARTHROPLASTY ANTERIOR APPROACH;  Surgeon: Gearlean Alf, MD;  Location: [facility];  Service: Orthopedics;  Laterality: Right;  . Abdominal hysterectomy  2011    partial  . Nasal septum surgery    . Tonsillectomy  age 70 and 68    x2  . Eye surgery Bilateral     lens replacements for cataract  . Inner ear surgery Bilateral as child    double mastoid  . Total hip arthroplasty Left 12/23/2014    Procedure: LEFT TOTAL HIP ARTHROPLASTY ANTERIOR APPROACH;  Surgeon: Gaynelle Arabian, MD;  Location: [facility];  Service: Orthopedics;  Laterality: Left;   Social History:   reports that she has quit smoking. Her  smoking use included Cigarettes. She has never used smokeless tobacco. She reports that she drinks alcohol. She reports that she does not use illicit drugs.  No family history on file.  Medications: Patient's Medications  New Prescriptions   No medications on file  Previous Medications   ACETAMINOPHEN (TYLENOL) 325 MG TABLET    Take 650 mg by mouth every 6 (six) hours as needed.   BISACODYL (DULCOLAX) 10 MG SUPPOSITORY    Place 1 suppository (10 mg total) rectally daily as needed for moderate constipation.   DOCUSATE SODIUM (COLACE) 100 MG CAPSULE    Take 1 capsule (100 mg total) by  mouth 2 (two) times daily.   HYDROMORPHONE (DILAUDID) 2 MG TABLET    Take 1-2 tablets (2-4 mg total) by mouth every 4 (four) hours as needed for moderate pain or severe pain.   MAGNESIUM 250 MG TABS    Take 250 mg by mouth daily.   METHOCARBAMOL (ROBAXIN) 500 MG TABLET    Take 1 tablet (500 mg total) by mouth every 6 (six) hours as needed for muscle spasms.   METOCLOPRAMIDE (REGLAN) 5 MG TABLET    Take 1 tablet (5 mg total) by mouth every 8 (eight) hours as needed for nausea (if ondansetron (ZOFRAN) ineffective.).   ONDANSETRON (ZOFRAN) 4 MG TABLET    Take 1 tablet (4 mg total) by mouth every 6 (six) hours as needed for nausea.   POLYETHYLENE GLYCOL (MIRALAX / GLYCOLAX) PACKET    Take 17 g by mouth daily as needed for mild constipation.   RIVAROXABAN (XARELTO) 10 MG TABS TABLET    Take 1 tablet (10 mg total) by mouth daily with breakfast. Take Xarelto for two and a half more weeks, then discontinue Xarelto. Once the patient has completed the blood thinner regimen, then take a Baby 81 mg Aspirin daily for three more weeks.   SODIUM PHOSPHATE (FLEET) 7-19 GM/118ML ENEM    Place 133 mLs (1 enema total) rectally once as needed for moderate constipation or severe constipation.   TRAMADOL (ULTRAM) 50 MG TABLET    Take 1-2 tablets (50-100 mg total) by mouth every 6 (six) hours as needed (mild pain).  Modified Medications   No medications on file  Discontinued Medications   No medications on file     Physical Exam: Filed Vitals:   12/28/14 1603  BP: 108/55  Pulse: 80  Temp: 97.5 F (36.4 C)  Resp: 20    Physical Exam  Constitutional: She is oriented to person, place, and time. She appears well-developed and well-nourished. No distress.  HENT:  Head: Normocephalic and atraumatic.  Mouth/Throat: Oropharynx is clear and moist. No oropharyngeal exudate.  Eyes: Conjunctivae are normal. Pupils are equal, round, and reactive to light.  Neck: Normal range of motion. Neck supple.  Cardiovascular:  Normal rate, regular rhythm and normal heart sounds.   Pulmonary/Chest: Effort normal and breath sounds normal.  Abdominal: Soft. Bowel sounds are normal.  Musculoskeletal: She exhibits no edema or tenderness.  Neurological: She is alert and oriented to person, place, and time.  Skin: Skin is warm and dry. She is not diaphoretic.  Well healing left hip incision  Psychiatric: She has a normal mood and affect.    Labs reviewed: Basic Metabolic Panel:  Recent Labs  12/17/14 1400 12/24/14 0440 12/25/14 0448  NA 140 139 141  K 5.2* 4.6 4.1  CL 107 108 108  CO2 27 24 26   GLUCOSE 97 184* 140*  BUN 21* 14 22*  CREATININE 0.76 0.74 0.56  CALCIUM 9.3 8.3* 8.5*   Liver Function Tests:  Recent Labs  02/16/14 1430 12/17/14 1400  AST 27 29  ALT 17 18  ALKPHOS 85 85  BILITOT 0.8 0.7  PROT 7.1 7.3  ALBUMIN 4.0 4.1   No results for input(s): LIPASE, AMYLASE in the last 8760 hours. No results for input(s): AMMONIA in the last 8760 hours. CBC:  Recent Labs  12/17/14 1400 12/24/14 0440 12/25/14 0448  WBC 8.0 11.6* 15.7*  HGB 13.1 10.1* 9.6*  HCT 40.0 30.8* 28.3*  MCV 97.3 97.5 96.3  PLT 250 201 172   TSH: No results for input(s): TSH in the last 8760 hours. A1C: No results found for: HGBA1C Lipid Panel: No results for input(s): CHOL, HDL, LDLCALC, TRIG, CHOLHDL, LDLDIRECT in the last 8760 hours.  Radiological Exams: Dg Pelvis Portable  12/23/2014  CLINICAL DATA:  81 year old female status post left hip surgery. Initial encounter. EXAM: PORTABLE PELVIS 1-2 VIEWS COMPARISON:  02/20/2014. FINDINGS: Portable AP view at 1231 hours. New left bipolar hip arthroplasty. Postoperative drain in place. Postoperative changes to the surrounding soft tissues. No unexpected osseous changes. Hardware components appear intact and normally aligned in the AP projection. Degenerative changes at the pubic symphysis. Chronic right hip arthroplasty. IMPRESSION: Left hip bipolar arthroplasty with  no adverse features. Electronically Signed   By: Genevie Ann M.D.   On: 12/23/2014 12:51   Dg C-arm 1-60 Min-no Report  12/23/2014  CLINICAL DATA: surgery C-ARM 1-60 MINUTES Fluoroscopy was utilized by the requesting physician.  No radiographic interpretation.     Assessment/Plan 1. Primary osteoarthritis of left hip S/p left total hip arthroplasty. Pain well controlled on dilaudid 2 mg 1-2 tab q4h prn pain with tramadol 50 mg 1-2 tab q6h prn pain. Also on robaxin 500 mg q6h prn muscle spasm. Continue xarelto for dvt prophylaxis Has follow up with ortho scheduled.  2. Slow transit constipation Remains stable, cont current regimen  3. Leukocytosis  Noted on discharge labs from hospital. No signs of infection or fever, will follow up CBC in am .  4. Anemia -post op, will follow up cbc at this time, will need ongoing monitoring by PCP  pt is stable for discharge at the end of week, will follow up labs prior to discharge.  No DME needed. Pt does not wish to have [place] ordered, reports she does not need this will do her exercises at home. Rx written.  will need to follow up with PCP within 2 weeks.    Carlos American. Harle Battiest  [facility] [phone] 8 am - 5 pm) [phone] (after hours)

## 2014-12-29 ENCOUNTER — Non-Acute Institutional Stay (SKILLED_NURSING_FACILITY): Payer: Medicare Other | Admitting: Internal Medicine

## 2014-12-29 DIAGNOSIS — R2681 Unsteadiness on feet: Secondary | ICD-10-CM

## 2014-12-29 DIAGNOSIS — D72829 Elevated white blood cell count, unspecified: Secondary | ICD-10-CM | POA: Diagnosis not present

## 2014-12-29 DIAGNOSIS — D62 Acute posthemorrhagic anemia: Secondary | ICD-10-CM | POA: Diagnosis not present

## 2014-12-29 DIAGNOSIS — M1612 Unilateral primary osteoarthritis, left hip: Secondary | ICD-10-CM

## 2014-12-29 DIAGNOSIS — K5901 Slow transit constipation: Secondary | ICD-10-CM

## 2014-12-29 NOTE — Progress Notes (Signed)
Patient ID: Virginia Bullock, female   DOB: May 18, 1928, 81 y.o.   MRN: [id number]     [facility]   PCP: Vidal Schwalbe, MD  Code Status: DNR  Allergies  Allergen Reactions  . Benadryl [Diphenhydramine Hcl] Anxiety    Panic attack  . Codeine Nausea And Vomiting  . Coumadin [Warfarin Sodium]     Severe rash  . Demerol [Meperidine] Nausea And Vomiting    Chief Complaint  Patient presents with  . New Admit To SNF     HPI:  81 y.o. patient is here for short term rehabilitation post hospital admission from 12/23/14-12/25/14 with left hip OA. She underwent left total hip arthroplasty. She is seen in her room today. She has been working with therapy team. Her pain is under cotrol. Her bowel movement is regulated. Denies any concerns this visit. No new concern from staff.  Review of Systems:  Constitutional: Negative for fever, chills, diaphoresis.  HENT: Negative for headache, congestion, nasal discharge Eyes: Negative for eye pain, blurred vision, double vision and discharge.  Respiratory: Negative for cough, shortness of breath and wheezing.   Cardiovascular: Negative for chest pain, palpitations, leg swelling.  Gastrointestinal: Negative for heartburn, nausea, vomiting, abdominal pain. Had bowel movement last night Genitourinary: Negative for dysuria  Musculoskeletal: Negative for back pain, falls Skin: Negative for itching, rash.  Neurological: Negative for dizziness, tingling, focal weakness Psychiatric/Behavioral: Negative for depression   Past Medical History  Diagnosis Date  . History of shingles     x2  . Post-nasal drip   . Hypothyroidism   . Basal cell carcinoma of skin   . Diverticulosis of colon without hemorrhage 02/27/2014  . Loss of hearing 02/27/2014    both  . Osteopenia 02/27/2014  . History of herpes zoster 02/27/2014  . Goiter   . Migraine headache 02/27/2014    migraines, aura without pain  . Arthritis     right  hip  . OA (osteoarthritis) of hip 02/20/2014  . Impaired hearing   . Complication of anesthesia     "epidural with 5th baby-stopped breathing and went numb, 'died'"   Past Surgical History  Procedure Laterality Date  . Basal cell carcinoma excision    . Mastoidectomy Bilateral     as a baby  . Cervical spine surgery  24 years ago    2 ruptured disc  . Knee arthroscopy Left 30 years ago    "born with knee cap problem"  . Total hip arthroplasty Right 02/20/2014    Procedure: RIGHT TOTAL HIP ARTHROPLASTY ANTERIOR APPROACH;  Surgeon: Gearlean Alf, MD;  Location: [facility];  Service: Orthopedics;  Laterality: Right;  . Abdominal hysterectomy  2011    partial  . Nasal septum surgery    . Tonsillectomy  age 62 and 30    x2  . Eye surgery Bilateral     lens replacements for cataract  . Inner ear surgery Bilateral as child    double mastoid  . Total hip arthroplasty Left 12/23/2014    Procedure: LEFT TOTAL HIP ARTHROPLASTY ANTERIOR APPROACH;  Surgeon: Gaynelle Arabian, MD;  Location: [facility];  Service: Orthopedics;  Laterality: Left;   Social History:   reports that she has quit smoking. Her smoking use included Cigarettes. She has never used smokeless tobacco. She reports that she drinks alcohol. She reports that she does not use illicit drugs.  No family history on file.  Medications:   Medication List  This list is accurate as of: 12/29/14 12:53 PM.  Always use your most recent med list.               acetaminophen 325 MG tablet  Commonly known as:  TYLENOL  Take 650 mg by mouth every 6 (six) hours as needed.     bisacodyl 10 MG suppository  Commonly known as:  DULCOLAX  Place 1 suppository (10 mg total) rectally daily as needed for moderate constipation.     docusate sodium 100 MG capsule  Commonly known as:  COLACE  Take 1 capsule (100 mg total) by mouth 2 (two) times daily.     HYDROmorphone 2 MG tablet  Commonly known as:  DILAUDID  Take 1-2 tablets (2-4 mg  total) by mouth every 4 (four) hours as needed for moderate pain or severe pain.     Magnesium 250 MG Tabs  Take 250 mg by mouth daily.     methocarbamol 500 MG tablet  Commonly known as:  ROBAXIN  Take 1 tablet (500 mg total) by mouth every 6 (six) hours as needed for muscle spasms.     metoCLOPramide 5 MG tablet  Commonly known as:  REGLAN  Take 1 tablet (5 mg total) by mouth every 8 (eight) hours as needed for nausea (if ondansetron (ZOFRAN) ineffective.).     ondansetron 4 MG tablet  Commonly known as:  ZOFRAN  Take 1 tablet (4 mg total) by mouth every 6 (six) hours as needed for nausea.     polyethylene glycol packet  Commonly known as:  MIRALAX / GLYCOLAX  Take 17 g by mouth daily as needed for mild constipation.     rivaroxaban 10 MG Tabs tablet  Commonly known as:  XARELTO  Take 1 tablet (10 mg total) by mouth daily with breakfast. Take Xarelto for two and a half more weeks, then discontinue Xarelto. Once the patient has completed the blood thinner regimen, then take a Baby 81 mg Aspirin daily for three more weeks.     sodium phosphate 7-19 GM/118ML Enem  Place 133 mLs (1 enema total) rectally once as needed for moderate constipation or severe constipation.     traMADol 50 MG tablet  Commonly known as:  ULTRAM  Take 1-2 tablets (50-100 mg total) by mouth every 6 (six) hours as needed (mild pain).         Physical Exam: Filed Vitals:   12/29/14 1252  BP: 116/72  Pulse: 88  Temp: 98.6 F (37 C)  Resp: 18  SpO2: 96%    General- elderly female, well built, in no acute distress Head- normocephalic, atraumatic Nose- normal nasal mucosa, no maxillary or frontal sinus tenderness, no nasal discharge Throat- moist mucus membrane  Eyes- PERRLA, EOMI, no pallor, no icterus, no discharge, normal conjunctiva, normal sclera Neck- no cervical lymphadenopathy Cardiovascular- normal s1,s2, no murmurs, palpable dorsalis pedis and radial pulses, trace leg edema Respiratory-  bilateral clear to auscultation, no wheeze, no rhonchi, no crackles, no use of accessory muscles Abdomen- bowel sounds present, soft, non tender Musculoskeletal- able to move all 4 extremities, limited left leg range of motion  Neurological- no focal deficit, alert and oriented to person, place and time Skin- warm and dry, left hip surgical incision with steri strip and healing well Psychiatry- normal mood and affect    Labs reviewed: Basic Metabolic Panel:  Recent Labs  12/17/14 1400 12/24/14 0440 12/25/14 0448  NA 140 139 141  K 5.2* 4.6 4.1  CL 107  108 108  CO2 27 24 26   GLUCOSE 97 184* 140*  BUN 21* 14 22*  CREATININE 0.76 0.74 0.56  CALCIUM 9.3 8.3* 8.5*   Liver Function Tests:  Recent Labs  02/16/14 1430 12/17/14 1400  AST 27 29  ALT 17 18  ALKPHOS 85 85  BILITOT 0.8 0.7  PROT 7.1 7.3  ALBUMIN 4.0 4.1   No results for input(s): LIPASE, AMYLASE in the last 8760 hours. No results for input(s): AMMONIA in the last 8760 hours. CBC:  Recent Labs  12/17/14 1400 12/24/14 0440 12/25/14 0448  WBC 8.0 11.6* 15.7*  HGB 13.1 10.1* 9.6*  HCT 40.0 30.8* 28.3*  MCV 97.3 97.5 96.3  PLT 250 201 172     Assessment/Plan  Unsteady gait Post surgical repair of left hip. Will have her work with physical therapy and occupational therapy team to help with gait training and muscle strengthening exercises.fall precautions. Skin care. Encourage to be out of bed.   Left hip OA S/p left total hip arthroplasty. Will have patient work with PT/OT as tolerated to regain strength and restore function.  Fall precautions are in place. Has f/u with orthopedics. Continue dilaudid 2 mg 1-2 tab q4h prn pain with tramadol 50 mg 1-2 tab q6h prn pain. Continue robaxin 500 mg q6h prn muscle spasm. Continue xarelto for dvt prophylaxis  Blood loss anemia Post op, monitor cbc  Leukocytosis Afebrile, surgical incision shows no signs of infection. Likely reactive. Monitor cbc with  diff  Constipation Continue colace 100 mg bid and miralax daily as needed. Maintain hydration  Goals of care: short term rehabilitation   Labs/tests ordered: cbc with diff  Family/ staff Communication: reviewed care plan with patient and nursing supervisor    Blanchie Serve, MD  [facility] [phone] (Monday-Friday 8 am - 5 pm) [phone] (afterhours)

## 2015-03-24 ENCOUNTER — Other Ambulatory Visit: Payer: Self-pay | Admitting: Dermatology

## 2015-05-13 ENCOUNTER — Other Ambulatory Visit: Payer: Self-pay | Admitting: Dermatology

## 2016-01-18 DIAGNOSIS — J0101 Acute recurrent maxillary sinusitis: Secondary | ICD-10-CM | POA: Diagnosis not present

## 2016-01-18 DIAGNOSIS — J209 Acute bronchitis, unspecified: Secondary | ICD-10-CM | POA: Diagnosis not present

## 2016-01-25 DIAGNOSIS — H6122 Impacted cerumen, left ear: Secondary | ICD-10-CM | POA: Diagnosis not present

## 2016-02-29 DIAGNOSIS — R05 Cough: Secondary | ICD-10-CM | POA: Diagnosis not present

## 2016-03-01 ENCOUNTER — Other Ambulatory Visit: Payer: Self-pay | Admitting: Family Medicine

## 2016-03-01 ENCOUNTER — Ambulatory Visit
Admission: RE | Admit: 2016-03-01 | Discharge: 2016-03-01 | Disposition: A | Discharge status: Home / Self Care | Payer: PPO | Source: Ambulatory Visit | Attending: Family Medicine | Admitting: Family Medicine

## 2016-03-01 DIAGNOSIS — J40 Bronchitis, not specified as acute or chronic: Secondary | ICD-10-CM | POA: Diagnosis not present

## 2016-03-01 DIAGNOSIS — R05 Cough: Secondary | ICD-10-CM

## 2016-03-01 DIAGNOSIS — R059 Cough, unspecified: Secondary | ICD-10-CM

## 2016-03-07 DIAGNOSIS — Z961 Presence of intraocular lens: Secondary | ICD-10-CM | POA: Diagnosis not present

## 2016-03-07 DIAGNOSIS — H26491 Other secondary cataract, right eye: Secondary | ICD-10-CM | POA: Diagnosis not present

## 2016-03-16 DIAGNOSIS — I517 Cardiomegaly: Secondary | ICD-10-CM | POA: Diagnosis not present

## 2016-03-16 DIAGNOSIS — R197 Diarrhea, unspecified: Secondary | ICD-10-CM | POA: Diagnosis not present

## 2016-03-27 DIAGNOSIS — R197 Diarrhea, unspecified: Secondary | ICD-10-CM | POA: Diagnosis not present

## 2016-03-27 DIAGNOSIS — R05 Cough: Secondary | ICD-10-CM | POA: Diagnosis not present

## 2016-03-27 DIAGNOSIS — I517 Cardiomegaly: Secondary | ICD-10-CM | POA: Diagnosis not present

## 2016-03-28 DIAGNOSIS — L570 Actinic keratosis: Secondary | ICD-10-CM | POA: Diagnosis not present

## 2016-03-28 DIAGNOSIS — L719 Rosacea, unspecified: Secondary | ICD-10-CM | POA: Diagnosis not present

## 2016-03-28 DIAGNOSIS — C44729 Squamous cell carcinoma of skin of left lower limb, including hip: Secondary | ICD-10-CM | POA: Diagnosis not present

## 2016-04-10 DIAGNOSIS — L570 Actinic keratosis: Secondary | ICD-10-CM | POA: Diagnosis not present

## 2016-04-17 DIAGNOSIS — I517 Cardiomegaly: Secondary | ICD-10-CM | POA: Diagnosis not present

## 2016-05-04 DIAGNOSIS — I340 Nonrheumatic mitral (valve) insufficiency: Secondary | ICD-10-CM | POA: Diagnosis not present

## 2016-05-04 DIAGNOSIS — I351 Nonrheumatic aortic (valve) insufficiency: Secondary | ICD-10-CM | POA: Diagnosis not present

## 2016-05-04 DIAGNOSIS — I517 Cardiomegaly: Secondary | ICD-10-CM | POA: Diagnosis not present

## 2016-05-30 ENCOUNTER — Other Ambulatory Visit: Payer: Self-pay | Admitting: Dermatology

## 2016-05-30 DIAGNOSIS — D492 Neoplasm of unspecified behavior of bone, soft tissue, and skin: Secondary | ICD-10-CM | POA: Diagnosis not present

## 2016-05-30 DIAGNOSIS — B078 Other viral warts: Secondary | ICD-10-CM | POA: Diagnosis not present

## 2016-05-30 DIAGNOSIS — C44719 Basal cell carcinoma of skin of left lower limb, including hip: Secondary | ICD-10-CM | POA: Diagnosis not present

## 2016-06-22 ENCOUNTER — Other Ambulatory Visit: Payer: Self-pay | Admitting: Dermatology

## 2016-06-22 DIAGNOSIS — D492 Neoplasm of unspecified behavior of bone, soft tissue, and skin: Secondary | ICD-10-CM | POA: Diagnosis not present

## 2016-06-22 DIAGNOSIS — C44719 Basal cell carcinoma of skin of left lower limb, including hip: Secondary | ICD-10-CM | POA: Diagnosis not present

## 2016-06-22 DIAGNOSIS — L821 Other seborrheic keratosis: Secondary | ICD-10-CM | POA: Diagnosis not present

## 2016-06-22 DIAGNOSIS — L570 Actinic keratosis: Secondary | ICD-10-CM | POA: Diagnosis not present

## 2016-07-24 DIAGNOSIS — C44719 Basal cell carcinoma of skin of left lower limb, including hip: Secondary | ICD-10-CM | POA: Diagnosis not present

## 2016-09-14 DIAGNOSIS — H903 Sensorineural hearing loss, bilateral: Secondary | ICD-10-CM | POA: Diagnosis not present

## 2016-09-20 DIAGNOSIS — H903 Sensorineural hearing loss, bilateral: Secondary | ICD-10-CM | POA: Diagnosis not present

## 2016-09-20 DIAGNOSIS — H6122 Impacted cerumen, left ear: Secondary | ICD-10-CM | POA: Diagnosis not present

## 2016-09-25 ENCOUNTER — Other Ambulatory Visit: Payer: Self-pay | Admitting: Dermatology

## 2016-09-25 DIAGNOSIS — L570 Actinic keratosis: Secondary | ICD-10-CM | POA: Diagnosis not present

## 2016-09-25 DIAGNOSIS — D492 Neoplasm of unspecified behavior of bone, soft tissue, and skin: Secondary | ICD-10-CM | POA: Diagnosis not present

## 2016-09-25 DIAGNOSIS — C44311 Basal cell carcinoma of skin of nose: Secondary | ICD-10-CM | POA: Diagnosis not present

## 2016-09-25 DIAGNOSIS — L821 Other seborrheic keratosis: Secondary | ICD-10-CM | POA: Diagnosis not present

## 2016-09-25 DIAGNOSIS — L820 Inflamed seborrheic keratosis: Secondary | ICD-10-CM | POA: Diagnosis not present

## 2016-09-25 DIAGNOSIS — C44319 Basal cell carcinoma of skin of other parts of face: Secondary | ICD-10-CM | POA: Diagnosis not present

## 2016-12-05 DIAGNOSIS — L570 Actinic keratosis: Secondary | ICD-10-CM | POA: Diagnosis not present

## 2017-01-15 DIAGNOSIS — C44311 Basal cell carcinoma of skin of nose: Secondary | ICD-10-CM | POA: Diagnosis not present

## 2017-01-15 DIAGNOSIS — L820 Inflamed seborrheic keratosis: Secondary | ICD-10-CM | POA: Diagnosis not present

## 2017-02-21 ENCOUNTER — Other Ambulatory Visit: Payer: Self-pay | Admitting: Dermatology

## 2017-02-21 DIAGNOSIS — D485 Neoplasm of uncertain behavior of skin: Secondary | ICD-10-CM | POA: Diagnosis not present

## 2017-02-21 DIAGNOSIS — B078 Other viral warts: Secondary | ICD-10-CM | POA: Diagnosis not present

## 2017-03-28 DIAGNOSIS — L570 Actinic keratosis: Secondary | ICD-10-CM | POA: Diagnosis not present

## 2017-03-28 DIAGNOSIS — L719 Rosacea, unspecified: Secondary | ICD-10-CM | POA: Diagnosis not present

## 2017-06-14 DIAGNOSIS — H903 Sensorineural hearing loss, bilateral: Secondary | ICD-10-CM | POA: Diagnosis not present

## 2017-06-14 DIAGNOSIS — H6122 Impacted cerumen, left ear: Secondary | ICD-10-CM | POA: Diagnosis not present

## 2017-07-26 DIAGNOSIS — R198 Other specified symptoms and signs involving the digestive system and abdomen: Secondary | ICD-10-CM | POA: Diagnosis not present

## 2017-08-13 ENCOUNTER — Other Ambulatory Visit: Payer: Self-pay | Admitting: Dermatology

## 2017-08-13 DIAGNOSIS — L570 Actinic keratosis: Secondary | ICD-10-CM | POA: Diagnosis not present

## 2017-08-13 DIAGNOSIS — L728 Other follicular cysts of the skin and subcutaneous tissue: Secondary | ICD-10-CM | POA: Diagnosis not present

## 2017-08-13 DIAGNOSIS — D045 Carcinoma in situ of skin of trunk: Secondary | ICD-10-CM | POA: Diagnosis not present

## 2017-09-05 DIAGNOSIS — R300 Dysuria: Secondary | ICD-10-CM | POA: Diagnosis not present

## 2017-09-14 DIAGNOSIS — M79605 Pain in left leg: Secondary | ICD-10-CM | POA: Diagnosis not present

## 2017-09-14 DIAGNOSIS — R300 Dysuria: Secondary | ICD-10-CM | POA: Diagnosis not present

## 2017-10-08 ENCOUNTER — Other Ambulatory Visit: Payer: Self-pay | Admitting: Dermatology

## 2017-10-08 DIAGNOSIS — C44529 Squamous cell carcinoma of skin of other part of trunk: Secondary | ICD-10-CM | POA: Diagnosis not present

## 2017-12-26 DIAGNOSIS — H903 Sensorineural hearing loss, bilateral: Secondary | ICD-10-CM | POA: Diagnosis not present

## 2017-12-26 DIAGNOSIS — H6122 Impacted cerumen, left ear: Secondary | ICD-10-CM | POA: Diagnosis not present

## 2018-01-23 DIAGNOSIS — Z23 Encounter for immunization: Secondary | ICD-10-CM | POA: Diagnosis not present

## 2018-01-23 DIAGNOSIS — M25571 Pain in right ankle and joints of right foot: Secondary | ICD-10-CM | POA: Diagnosis not present

## 2018-01-23 DIAGNOSIS — I340 Nonrheumatic mitral (valve) insufficiency: Secondary | ICD-10-CM | POA: Diagnosis not present

## 2018-01-23 DIAGNOSIS — I351 Nonrheumatic aortic (valve) insufficiency: Secondary | ICD-10-CM | POA: Diagnosis not present

## 2018-01-23 DIAGNOSIS — E785 Hyperlipidemia, unspecified: Secondary | ICD-10-CM | POA: Diagnosis not present

## 2018-01-23 DIAGNOSIS — M25572 Pain in left ankle and joints of left foot: Secondary | ICD-10-CM | POA: Diagnosis not present

## 2018-01-23 DIAGNOSIS — Z0000 Encounter for general adult medical examination without abnormal findings: Secondary | ICD-10-CM | POA: Diagnosis not present

## 2018-02-06 DIAGNOSIS — M79672 Pain in left foot: Secondary | ICD-10-CM | POA: Diagnosis not present

## 2018-02-06 DIAGNOSIS — M25571 Pain in right ankle and joints of right foot: Secondary | ICD-10-CM | POA: Diagnosis not present

## 2018-02-06 DIAGNOSIS — M67962 Unspecified disorder of synovium and tendon, left lower leg: Secondary | ICD-10-CM | POA: Diagnosis not present

## 2018-02-06 DIAGNOSIS — M25572 Pain in left ankle and joints of left foot: Secondary | ICD-10-CM | POA: Diagnosis not present

## 2018-02-06 DIAGNOSIS — M67961 Unspecified disorder of synovium and tendon, right lower leg: Secondary | ICD-10-CM | POA: Diagnosis not present

## 2018-02-06 DIAGNOSIS — M79671 Pain in right foot: Secondary | ICD-10-CM | POA: Diagnosis not present

## 2018-02-07 DIAGNOSIS — M76829 Posterior tibial tendinitis, unspecified leg: Secondary | ICD-10-CM | POA: Diagnosis not present

## 2018-02-12 DIAGNOSIS — M76829 Posterior tibial tendinitis, unspecified leg: Secondary | ICD-10-CM | POA: Diagnosis not present

## 2018-02-14 DIAGNOSIS — M76829 Posterior tibial tendinitis, unspecified leg: Secondary | ICD-10-CM | POA: Diagnosis not present

## 2018-02-19 DIAGNOSIS — M76829 Posterior tibial tendinitis, unspecified leg: Secondary | ICD-10-CM | POA: Diagnosis not present

## 2018-02-21 DIAGNOSIS — M76822 Posterior tibial tendinitis, left leg: Secondary | ICD-10-CM | POA: Diagnosis not present

## 2018-02-21 DIAGNOSIS — M76821 Posterior tibial tendinitis, right leg: Secondary | ICD-10-CM | POA: Diagnosis not present

## 2018-02-26 DIAGNOSIS — M76829 Posterior tibial tendinitis, unspecified leg: Secondary | ICD-10-CM | POA: Diagnosis not present

## 2018-02-28 DIAGNOSIS — M76829 Posterior tibial tendinitis, unspecified leg: Secondary | ICD-10-CM | POA: Diagnosis not present

## 2018-03-08 DIAGNOSIS — M25571 Pain in right ankle and joints of right foot: Secondary | ICD-10-CM | POA: Diagnosis not present

## 2018-03-08 DIAGNOSIS — M25572 Pain in left ankle and joints of left foot: Secondary | ICD-10-CM | POA: Diagnosis not present

## 2018-03-08 DIAGNOSIS — M76822 Posterior tibial tendinitis, left leg: Secondary | ICD-10-CM | POA: Diagnosis not present

## 2018-03-16 DIAGNOSIS — M25571 Pain in right ankle and joints of right foot: Secondary | ICD-10-CM | POA: Diagnosis not present

## 2018-03-16 DIAGNOSIS — M25572 Pain in left ankle and joints of left foot: Secondary | ICD-10-CM | POA: Diagnosis not present

## 2018-03-22 DIAGNOSIS — M76822 Posterior tibial tendinitis, left leg: Secondary | ICD-10-CM | POA: Diagnosis not present

## 2018-03-22 DIAGNOSIS — M67962 Unspecified disorder of synovium and tendon, left lower leg: Secondary | ICD-10-CM | POA: Diagnosis not present

## 2018-03-22 DIAGNOSIS — M25572 Pain in left ankle and joints of left foot: Secondary | ICD-10-CM | POA: Diagnosis not present

## 2018-03-22 DIAGNOSIS — M25571 Pain in right ankle and joints of right foot: Secondary | ICD-10-CM | POA: Diagnosis not present

## 2018-05-20 DIAGNOSIS — H6123 Impacted cerumen, bilateral: Secondary | ICD-10-CM | POA: Diagnosis not present

## 2018-06-04 ENCOUNTER — Telehealth: Payer: Self-pay

## 2018-06-04 NOTE — Telephone Encounter (Signed)
Called patient advised I spoke to Virginia Bullock one of our schedulers she was calling to schedule you a new patient appointment.She was concerned you sounded so sob over the phone.She ask me to call you.Patient stated she has been sob for a while.Stated sob seems to be getting worse the past 2 weeks.Hard to walk from room to room.Stated she does have a non productive cough but she was told by her PCP she has acid reflux.Stated she is a old pt of Dr.Tilley's and 2 years ago he ordered a echo which revealed 2 small leaks in mitral and aortic valves.Stated at that time she was not having any symptoms.She has swelling in both ankles.No swelling in feet and legs.Her weight is stable.Stated she takes 2 Tylenol tablets that make her feel better.Advised Virginia Bullock will be calling her back with a appointment.Advised to go to ED if her sob gets worse.

## 2018-06-05 ENCOUNTER — Telehealth: Payer: Self-pay

## 2018-06-05 ENCOUNTER — Other Ambulatory Visit: Payer: Self-pay

## 2018-06-05 ENCOUNTER — Encounter: Payer: Self-pay | Admitting: Internal Medicine

## 2018-06-05 ENCOUNTER — Telehealth: Payer: Self-pay | Admitting: *Deleted

## 2018-06-05 ENCOUNTER — Ambulatory Visit (INDEPENDENT_AMBULATORY_CARE_PROVIDER_SITE_OTHER): Payer: PPO | Admitting: Internal Medicine

## 2018-06-05 ENCOUNTER — Other Ambulatory Visit: Payer: PPO

## 2018-06-05 VITALS — BP 126/88 | HR 105 | Ht 62.0 in | Wt 134.4 lb

## 2018-06-05 DIAGNOSIS — Z20822 Contact with and (suspected) exposure to covid-19: Secondary | ICD-10-CM

## 2018-06-05 DIAGNOSIS — R05 Cough: Secondary | ICD-10-CM

## 2018-06-05 DIAGNOSIS — R5383 Other fatigue: Secondary | ICD-10-CM

## 2018-06-05 DIAGNOSIS — I340 Nonrheumatic mitral (valve) insufficiency: Secondary | ICD-10-CM

## 2018-06-05 DIAGNOSIS — R0602 Shortness of breath: Secondary | ICD-10-CM | POA: Diagnosis not present

## 2018-06-05 DIAGNOSIS — R059 Cough, unspecified: Secondary | ICD-10-CM

## 2018-06-05 DIAGNOSIS — I351 Nonrheumatic aortic (valve) insufficiency: Secondary | ICD-10-CM

## 2018-06-05 DIAGNOSIS — R6889 Other general symptoms and signs: Secondary | ICD-10-CM | POA: Diagnosis not present

## 2018-06-05 NOTE — Telephone Encounter (Signed)
Attempted to reach pt for Covid-19 test , left message.  Requested by Dr. Willaim Bane [facility] # [phone] FAX  [phone]  Pt with cough, SOB.

## 2018-06-05 NOTE — Telephone Encounter (Signed)
Opened in error

## 2018-06-05 NOTE — Telephone Encounter (Signed)
Pt scheduled for covid 19 testing at [facility] site, [place] today.   Orders per Dr. Cherlynn Kaiser.   Pt symptomatic with cough, SOB.  Reviewed testing process with pt; verbalizes understanding.

## 2018-06-05 NOTE — Patient Instructions (Signed)
Medication Instructions:  Your Physician recommend you continue on your current medication as directed.    If you need a refill on your cardiac medications before your next appointment, please call your pharmacy.   Lab work: None   Testing/Procedures: COVID test   Follow-Up: Your physician recommends that you schedule a follow-up appointment as needed based on test.

## 2018-06-05 NOTE — Progress Notes (Signed)
Cardiology Office Note:    Date:  06/05/2018   ID:  Virginia Bullock, DOB 01/28/1928, MRN [id number]  PCP:  Harlan Stains, MD  Cardiologist:  No primary care provider on file.  Electrophysiologist:  None   Referring MD: Harlan Stains, MD   Chief Complaint: New shortness of breath, and change in quality of cough  History of Present Illness:    Virginia Bullock is a 85 y.o. female with a hx of mild mitral valve regurgitation and mild-moderate aortic valve regurgitation. She is being seen at the request of Dr. Dema Severin for worsening shortness of breath and change in cough.  She is a previous patient of Dr. Tollie Eth.  She has been previously noted to have cardiomegaly on chest x-ray and an echocardiogram was performed which demonstrated mild mitral valve regurgitation as well as mild to moderate aortic valve regurgitation, and conservative management was recommended with follow up in 2 years.  She was also noted to have a murmur on exam.  At the time of her last visit with Dr. Wynonia Lawman she had minimal symptoms and was easily able to complete greater than 4 METS of activity daily.  She feels she has had a longstanding cough and has been told that she has GERD.  She tells me that her cough has changed in quality in the past several weeks and is more violent with feeling of post-tussive emesis.  While she emphasizes no concerning contacts for COVID-19, she also mentions that over Mother's Day weekend she had several people over to her house for a socially distancing celebration.  It is unclear to me at what distance her contacts were kept. 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 She tells me they don't interact, but do live under the same roof.   She is concerned about shortness of breath for the past few weeks that is worsening, particularly with exertion. She also describes a concomitant cough, with a change in quality  as compared to her chronic cough. She feels extremely fatigued and is quite concerned about this as well.   I called the patient yesterday evening in anticipation of her visit and spoke to her about her symptoms for approximately 10 minutes. I encouraged her to be tested for coronavirus since we cannot be too careful.  She feels there is absolutely no way that she could have coronavirus, and feels she would be very ill if she did. We discussed in detail the concept of asymptomatic carriers and mild viral symptoms. She feels there is no possibility of infection with COVID 19.  With regard to cardiovascular symptoms, she feels that her SOB and cough improve when supine. She is mostly symptomatic with DOE, and complains of fatigue. This is new as of the past few weeks.  The patient denies chest pain, chest pressure, dyspnea at rest, palpitations, PND, orthopnea, or leg swelling. Denies syncope or presyncope. Denies dizziness or lightheadedness. Denies snoring and has not been evaluated for sleep apnea. No fever.  The patient does have symptoms concerning for COVID-19 infection (fever, chills, cough, or new shortness of breath).    Past Medical History:  Diagnosis Date  . Arthritis    right hip  . Basal cell carcinoma of skin   . Complication of anesthesia    "epidural with 5th baby-stopped breathing and went numb, 'died'"  . Diverticulosis of colon without hemorrhage 02/27/2014  . Goiter   .  History of herpes zoster 02/27/2014  . History of shingles    x2  . Hypothyroidism   . Impaired hearing   . Loss of hearing 02/27/2014   both  . Migraine headache 02/27/2014   migraines, aura without pain  . OA (osteoarthritis) of hip 02/20/2014  . Osteopenia 02/27/2014  . Post-nasal drip     Past Surgical History:  Procedure Laterality Date  . ABDOMINAL HYSTERECTOMY  2011   partial  . BASAL CELL CARCINOMA EXCISION    . CERVICAL SPINE SURGERY  24 years ago   2 ruptured disc  . EYE SURGERY  Bilateral    lens replacements for cataract  . INNER EAR SURGERY Bilateral as child   double mastoid  . KNEE ARTHROSCOPY Left 30 years ago   "born with knee cap problem"  . MASTOIDECTOMY Bilateral    as a baby  . NASAL SEPTUM SURGERY    . TONSILLECTOMY  age 67 and 21   x2  . TOTAL HIP ARTHROPLASTY Right 02/20/2014   Procedure: RIGHT TOTAL HIP ARTHROPLASTY ANTERIOR APPROACH;  Surgeon: Gearlean Alf, MD;  Location: [facility];  Service: Orthopedics;  Laterality: Right;  . TOTAL HIP ARTHROPLASTY Left 12/23/2014   Procedure: LEFT TOTAL HIP ARTHROPLASTY ANTERIOR APPROACH;  Surgeon: Gaynelle Arabian, MD;  Location: [facility];  Service: Orthopedics;  Laterality: Left;    Current Medications: Current Meds  Medication Sig  . acetaminophen (TYLENOL) 325 MG tablet Take 650 mg by mouth every 6 (six) hours as needed.     Allergies:   Benadryl [diphenhydramine hcl]; Codeine; Coumadin [warfarin sodium]; and Demerol [meperidine]   Social History   Socioeconomic History  . Marital status: Widowed    Spouse name: Not on file  . Number of children: Not on file  . Years of education: Not on file  . Highest education level: Not on file  Occupational History  . Not on file  Social Needs  . Financial resource strain: Not on file  . Food insecurity:    Worry: Not on file    Inability: Not on file  . Transportation needs:    Medical: Not on file    Non-medical: Not on file  Tobacco Use  . Smoking status: Former Smoker    Types: Cigarettes  . Smokeless tobacco: Never Used  . Tobacco comment: quit 25 years ago  Substance and Sexual Activity  . Alcohol use: Yes    Comment: glass of wine daily  . Drug use: No  . Sexual activity: Not on file  Lifestyle  . Physical activity:    Days per week: Not on file    Minutes per session: Not on file  . Stress: Not on file  Relationships  . Social connections:    Talks on phone: Not on file    Gets together: Not on file    Attends religious service: Not on  file    Active member of club or organization: Not on file    Attends meetings of clubs or organizations: Not on file    Relationship status: Not on file  Other Topics Concern  . Not on file  Social History Narrative  . Not on file     Family History: The patient's family history includes Cancer in her maternal grandmother; Tuberculosis in her maternal grandfather.  ROS:   Please see the history of present illness.    All other systems reviewed and are negative.  EKGs/Labs/Other Studies Reviewed:    The following studies were  reviewed today:  EKG:  Sinus tachycardia with PVCs, rate 105.   Recent Labs: No results found for requested labs within last 8760 hours.  Recent Lipid Panel No results found for: CHOL, TRIG, HDL, CHOLHDL, VLDL, LDLCALC, LDLDIRECT  Physical Exam:    VS:  BP 126/88   Pulse (!) 105   Ht 5\' 2"  (1.575 m)   Wt 134 lb 6.4 oz (61 kg)   BMI 24.58 kg/m     Wt Readings from Last 5 Encounters:  06/05/18 134 lb 6.4 oz (61 kg)  12/23/14 131 lb (59.4 kg)  12/17/14 131 lb 14.4 oz (59.8 kg)  03/06/14 136 lb (61.7 kg)  02/27/14 133 lb (60.3 kg)     Constitutional: No acute distress Eyes: sclera non-icteric, normal conjunctiva and lids Cardiovascular: irregular rhythm, normal rate. S1 and S2 normal. No jugular venous distention.  Respiratory: no increased work of breathing.  GI : soft and nontender. No distention.   MSK: extremities warm, well perfused. No edema.  NEURO: grossly nonfocal exam, moves all extremities. PSYCH: alert and oriented x 3, normal mood and affect.   ASSESSMENT:    1. SOB (shortness of breath)   2. Cough   3. Mild mitral valve regurgitation   4. Mild aortic regurgitation   5. 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 To continue the cardiovascular evaluation, I believe it is imperative that we test her for the virus, so that we can appropriately and safely schedule her cardiac testing.   We will follow up the results of her COVID testing, and subsequently order an echocardiogram to evaluate her valvular lesions in the setting of new shortness of breath and cough. We can also consider an ischemic evaluation with stress testing.   TIME SPENT WITH PATIENT: 30 minutes of direct patient care. More than 50% of that time was spent on coordination of care and counseling regarding corona virus, valvular regurgitation, Sob and fatigue.  Cherlynn Kaiser, MD [place]  [facility]   Medication Adjustments/Labs and Tests Ordered: Current medicines are reviewed at length with the patient today.  Concerns regarding medicines are outlined above.  Orders Placed This Encounter  Procedures  . EKG 12-Lead   No orders of the defined types were placed in this encounter.   Patient Instructions  Medication Instructions:  Your Physician recommend you continue on your current medication as directed.    If you need a refill on your cardiac medications before your next appointment, please call your pharmacy.   Lab work: None   Testing/Procedures: COVID test   Follow-Up: Your physician recommends that you schedule a follow-up appointment as needed based on test.

## 2018-06-07 ENCOUNTER — Telehealth: Payer: Self-pay | Admitting: Internal Medicine

## 2018-06-07 LAB — NOVEL CORONAVIRUS, NAA: SARS-CoV-2, NAA: NOT DETECTED

## 2018-06-07 NOTE — Telephone Encounter (Signed)
Test results of the SARS-CoV-2 showed not detected. Results given to patient with verbal undestanding.

## 2018-06-12 ENCOUNTER — Telehealth: Payer: Self-pay | Admitting: *Deleted

## 2018-06-12 DIAGNOSIS — R0602 Shortness of breath: Secondary | ICD-10-CM

## 2018-06-12 DIAGNOSIS — I351 Nonrheumatic aortic (valve) insufficiency: Secondary | ICD-10-CM

## 2018-06-12 DIAGNOSIS — I340 Nonrheumatic mitral (valve) insufficiency: Secondary | ICD-10-CM

## 2018-06-12 NOTE — Telephone Encounter (Signed)
Virginia Munroe, MD  Raiford Simmonds, RN        covid test was negative. Please schedule echo for new shortness of breath. Level    LEFT MESSAGE FOR PATIENT CALL BACK - NEED INFORM HER TEST BEING ORDERED.

## 2018-06-18 DIAGNOSIS — I351 Nonrheumatic aortic (valve) insufficiency: Secondary | ICD-10-CM | POA: Diagnosis not present

## 2018-06-18 DIAGNOSIS — H9202 Otalgia, left ear: Secondary | ICD-10-CM | POA: Diagnosis not present

## 2018-06-18 DIAGNOSIS — R0600 Dyspnea, unspecified: Secondary | ICD-10-CM | POA: Diagnosis not present

## 2018-06-18 DIAGNOSIS — K219 Gastro-esophageal reflux disease without esophagitis: Secondary | ICD-10-CM | POA: Diagnosis not present

## 2018-06-19 DIAGNOSIS — H6123 Impacted cerumen, bilateral: Secondary | ICD-10-CM | POA: Diagnosis not present

## 2018-06-21 ENCOUNTER — Telehealth (HOSPITAL_COMMUNITY): Payer: Self-pay | Admitting: Radiology

## 2018-06-21 NOTE — Telephone Encounter (Signed)

## 2018-06-24 ENCOUNTER — Other Ambulatory Visit: Payer: Self-pay

## 2018-06-24 ENCOUNTER — Ambulatory Visit (HOSPITAL_COMMUNITY): Payer: PPO | Attending: Cardiovascular Disease

## 2018-06-24 DIAGNOSIS — I340 Nonrheumatic mitral (valve) insufficiency: Secondary | ICD-10-CM | POA: Insufficient documentation

## 2018-06-24 DIAGNOSIS — I351 Nonrheumatic aortic (valve) insufficiency: Secondary | ICD-10-CM | POA: Diagnosis not present

## 2018-06-24 DIAGNOSIS — H60333 Swimmer's ear, bilateral: Secondary | ICD-10-CM | POA: Diagnosis not present

## 2018-06-24 DIAGNOSIS — R0602 Shortness of breath: Secondary | ICD-10-CM | POA: Insufficient documentation

## 2018-06-27 ENCOUNTER — Telehealth: Payer: Self-pay | Admitting: *Deleted

## 2018-06-27 NOTE — Telephone Encounter (Signed)
-----   Message from Elouise Munroe, MD sent at 06/27/2018  1:21 PM EDT ----- Ventricles are pumping well and heart relaxes normally. Mitral valve regurgitation has increased slightly, and otherwise echo is largely unchanged. I do not think the increase in leaky mitral valve is the source of her profound fatigue and SOB with exertion.   We had discussed ordering a lexiscan to ensure it is not related to ischemia. Please order this if the patient is amenable to it. We will determine f/u plan after that test results.

## 2018-06-27 NOTE — Telephone Encounter (Signed)
The patient has been notified of the result and verbalized understanding.  All questions (if any) were answered. Patient states she would like to hold off on having  Lexiscan done at the present -  She thinks her shortness of breathe is coming from her lungs or acid reflux issue. She states she becomes very short of breath first thing in the mornings  patient states she would like to talk to her primary first then make a decision.  SHE STATES SHE WILL CALL if she would like to proceed with Lexiscan and follow up appointment, Raiford Simmonds, RN 06/27/2018 2:27 PM

## 2018-06-28 ENCOUNTER — Ambulatory Visit (HOSPITAL_COMMUNITY)
Admission: EM | Admit: 2018-06-28 | Discharge: 2018-06-28 | Disposition: A | Discharge status: Home / Self Care | Payer: PPO | Attending: Internal Medicine | Admitting: Internal Medicine

## 2018-06-28 ENCOUNTER — Encounter (HOSPITAL_COMMUNITY): Payer: Self-pay | Admitting: Emergency Medicine

## 2018-06-28 ENCOUNTER — Other Ambulatory Visit: Payer: Self-pay

## 2018-06-28 DIAGNOSIS — R05 Cough: Secondary | ICD-10-CM

## 2018-06-28 DIAGNOSIS — R059 Cough, unspecified: Secondary | ICD-10-CM

## 2018-06-28 MED ORDER — BENZONATATE 100 MG PO CAPS: 100.0000 mg | ORAL_CAPSULE | Freq: Three times a day (TID) | ORAL | 0 refills | Status: DC | PRN

## 2018-06-28 NOTE — ED Triage Notes (Addendum)
Pt reports a clear productive cough in the morning that develops into a dry cough during the day.  Pt has issues with doing ADL's because she will get very SOB.  Pt was recently seen by cardiology.  Pt had to stop while walking to the room because she was so short of breath.  Pt has trouble completing sentences.  Pt reports an issue with post nasal drip as well.  This has been an ongoing issue for a while. She suffers from acid reflux and takes OTC medication for this.

## 2018-06-28 NOTE — ED Provider Notes (Signed)
[place]    CSN: [id number] Arrival date & time: 06/28/18  1622      History   Chief Complaint Chief Complaint  Patient presents with  . Cough  . Shortness of Breath    HPI Virginia Bullock is a 85 y.o. female with mitral valve regurgitation comes to urgent care with complaints of worsening cough and shortness of breath over the past several weeks.  Patient has had a cough for several weeks and is apparently been getting worse.  Cough is aggravated by taking a deep breath.  No known relieving factors.  She denies any chest pain or chest pressure.  No sputum produced.  No lower extremity swelling.  No dizziness, near syncope or syncopal episodes.  Patient denies any choking episodes while eating.  No history of allergies    Past Medical History:  Diagnosis Date  . Arthritis    right hip  . Basal cell carcinoma of skin   . Complication of anesthesia    "epidural with 5th baby-stopped breathing and went numb, 'died'"  . Diverticulosis of colon without hemorrhage 02/27/2014  . Goiter   . History of herpes zoster 02/27/2014  . History of shingles    x2  . Hypothyroidism   . Impaired hearing   . Loss of hearing 02/27/2014   both  . Migraine headache 02/27/2014   migraines, aura without pain  . OA (osteoarthritis) of hip 02/20/2014  . Osteopenia 02/27/2014  . Post-nasal drip     Patient Active Problem List   Diagnosis Date Noted  . Rash and nonspecific skin eruption 02/27/2014  . Loss of hearing 02/27/2014  . Lactose intolerance 02/27/2014  . Migraine headache 02/27/2014  . Hypothyroidism 02/27/2014  . Osteopenia 02/27/2014  . OA (osteoarthritis) of hip 02/20/2014    Past Surgical History:  Procedure Laterality Date  . ABDOMINAL HYSTERECTOMY  2011   partial  . BASAL CELL CARCINOMA EXCISION    . CERVICAL SPINE SURGERY  24 years ago   2 ruptured disc  . EYE SURGERY Bilateral    lens replacements for cataract  . INNER EAR SURGERY Bilateral as child   double mastoid  . KNEE ARTHROSCOPY Left 30 years ago   "born with knee cap problem"  . MASTOIDECTOMY Bilateral    as a baby  . NASAL SEPTUM SURGERY    . TONSILLECTOMY  age 30 and 59   x2  . TOTAL HIP ARTHROPLASTY Right 02/20/2014   Procedure: RIGHT TOTAL HIP ARTHROPLASTY ANTERIOR APPROACH;  Surgeon: Gearlean Alf, MD;  Location: [facility];  Service: Orthopedics;  Laterality: Right;  . TOTAL HIP ARTHROPLASTY Left 12/23/2014   Procedure: LEFT TOTAL HIP ARTHROPLASTY ANTERIOR APPROACH;  Surgeon: Gaynelle Arabian, MD;  Location: [facility];  Service: Orthopedics;  Laterality: Left;    OB History   No obstetric history on file.      Home Medications    Prior to Admission medications   Medication Sig Start Date End Date Taking? Authorizing Provider  acetaminophen (TYLENOL) 325 MG tablet Take 650 mg by mouth every 6 (six) hours as needed.   Yes [provider]  NEOMYCIN-POLYMYXIN-HYDROCORTISONE (CORTISPORIN) 1 % SOLN OTIC solution  06/25/18  Yes [provider]  omeprazole (PRILOSEC) 10 MG capsule Take 10 mg by mouth daily.   Yes [provider]    Family History Family History  Problem Relation Age of Onset  . Cancer Maternal Grandmother   . Tuberculosis Maternal Grandfather     Social  History Social History   Tobacco Use  . Smoking status: Former Smoker    Types: Cigarettes  . Smokeless tobacco: Never Used  . Tobacco comment: quit 25 years ago  Substance Use Topics  . Alcohol use: Yes    Comment: glass of wine daily  . Drug use: No     Allergies   Benadryl [diphenhydramine hcl], Codeine, Coumadin [warfarin sodium], and Demerol [meperidine]   Review of Systems Review of Systems  Constitutional: Negative for activity change, appetite change and chills.  HENT: Negative for congestion, postnasal drip, rhinorrhea, sinus pressure and sinus pain.   Eyes: Negative.   Respiratory: Positive for cough and shortness of breath. Negative for chest tightness and  wheezing.   Cardiovascular: Negative.   Gastrointestinal: Negative for abdominal distention and abdominal pain.  Genitourinary: Negative.   Musculoskeletal: Negative.   Neurological: Negative for dizziness, weakness, light-headedness, numbness and headaches.     Physical Exam Triage Vital Signs ED Triage Vitals  Enc Vitals Group     BP 06/28/18 1720 131/69     Pulse Rate 06/28/18 1720 77     Resp 06/28/18 1720 (!) 30     Temp 06/28/18 1720 98.5 F (36.9 C)     Temp Source 06/28/18 1720 Oral     SpO2 06/28/18 1720 95 %     Weight --      Height --      Head Circumference --      Peak Flow --      Pain Score 06/28/18 1725 0     Pain Loc --      Pain Edu? --      Excl. in [place]? --    No data found.  Updated Vital Signs BP 131/69 (BP Location: Right Arm)   Pulse 77   Temp 98.5 F (36.9 C) (Oral)   Resp (!) 30   SpO2 95%   Visual Acuity Right Eye Distance:   Left Eye Distance:   Bilateral Distance:    Right Eye Near:   Left Eye Near:    Bilateral Near:     Physical Exam Constitutional:      General: She is not in acute distress.    Appearance: She is well-developed. She is not ill-appearing.  Eyes:     Extraocular Movements: Extraocular movements intact.  Neck:     Musculoskeletal: Normal range of motion and neck supple.  Cardiovascular:     Rate and Rhythm: Normal rate and regular rhythm.  Pulmonary:     Effort: Pulmonary effort is normal.     Breath sounds: Normal breath sounds. No decreased breath sounds or wheezing.  Chest:     Chest wall: No deformity or tenderness. There is no dullness to percussion.  Abdominal:     Palpations: Abdomen is soft. There is no hepatomegaly or splenomegaly.  Musculoskeletal: Normal range of motion.  Skin:    General: Skin is warm.     Capillary Refill: Capillary refill takes less than 2 seconds.  Neurological:     General: No focal deficit present.     Mental Status: She is alert. She is disoriented.      UC  Treatments / Results  Labs (all labs ordered are listed, but only abnormal results are displayed) Labs Reviewed - No data to display  EKG None  Radiology No results found.  Procedures Procedures (including critical care time)  Medications Ordered in UC Medications - No data to display  Initial Impression / Assessment and  Plan / UC Course  I have reviewed the triage vital signs and the nursing notes.  Pertinent labs & imaging results that were available during my care of the patient were reviewed by me and considered in my medical decision making (see chart for details).     1.  Cough: Patient may need speech evaluation for possible pharyngeal no esophageal swallowing issues.  Patient does not want to have any further procedures done. Patient is prescribed Tessalon Perles to help with coughing. Final Clinical Impressions(s) / UC Diagnoses   Final diagnoses:  None   Discharge Instructions   None    ED Prescriptions    None     Controlled Substance Prescriptions [place] Controlled Substance Registry consulted? No   Chase Picket, MD 06/29/18 [phone]

## 2018-07-01 ENCOUNTER — Other Ambulatory Visit (HOSPITAL_COMMUNITY): Payer: Self-pay | Admitting: Family Medicine

## 2018-07-01 ENCOUNTER — Other Ambulatory Visit (HOSPITAL_COMMUNITY): Payer: PPO

## 2018-07-01 ENCOUNTER — Emergency Department (HOSPITAL_COMMUNITY): Admission: EM | Admit: 2018-07-01 | Discharge: 2018-07-01 | Discharge status: Absent without leave | Payer: PPO

## 2018-07-01 ENCOUNTER — Other Ambulatory Visit: Payer: Self-pay

## 2018-07-01 ENCOUNTER — Ambulatory Visit (HOSPITAL_COMMUNITY)
Admission: RE | Admit: 2018-07-01 | Discharge: 2018-07-01 | Disposition: A | Discharge status: Home / Self Care | Payer: PPO | Source: Ambulatory Visit | Attending: Family Medicine | Admitting: Family Medicine

## 2018-07-01 DIAGNOSIS — R05 Cough: Secondary | ICD-10-CM | POA: Diagnosis not present

## 2018-07-01 DIAGNOSIS — R0602 Shortness of breath: Secondary | ICD-10-CM | POA: Diagnosis not present

## 2018-07-01 DIAGNOSIS — R059 Cough, unspecified: Secondary | ICD-10-CM

## 2018-07-01 DIAGNOSIS — R0600 Dyspnea, unspecified: Secondary | ICD-10-CM | POA: Diagnosis not present

## 2018-07-16 ENCOUNTER — Other Ambulatory Visit: Payer: Self-pay | Admitting: Family Medicine

## 2018-07-16 ENCOUNTER — Ambulatory Visit
Admission: RE | Admit: 2018-07-16 | Discharge: 2018-07-16 | Disposition: A | Discharge status: Home / Self Care | Payer: PPO | Source: Ambulatory Visit | Attending: Family Medicine | Admitting: Family Medicine

## 2018-07-16 DIAGNOSIS — J984 Other disorders of lung: Secondary | ICD-10-CM | POA: Diagnosis not present

## 2018-07-16 DIAGNOSIS — J189 Pneumonia, unspecified organism: Secondary | ICD-10-CM

## 2018-07-16 DIAGNOSIS — J181 Lobar pneumonia, unspecified organism: Secondary | ICD-10-CM | POA: Diagnosis not present

## 2018-07-19 DIAGNOSIS — K219 Gastro-esophageal reflux disease without esophagitis: Secondary | ICD-10-CM | POA: Diagnosis not present

## 2018-07-19 DIAGNOSIS — J189 Pneumonia, unspecified organism: Secondary | ICD-10-CM | POA: Diagnosis not present

## 2018-07-30 ENCOUNTER — Other Ambulatory Visit (HOSPITAL_COMMUNITY): Payer: Self-pay

## 2018-07-30 DIAGNOSIS — R1310 Dysphagia, unspecified: Secondary | ICD-10-CM

## 2018-08-01 ENCOUNTER — Other Ambulatory Visit: Payer: Self-pay

## 2018-08-01 ENCOUNTER — Ambulatory Visit (HOSPITAL_COMMUNITY)
Admission: RE | Admit: 2018-08-01 | Discharge: 2018-08-01 | Disposition: A | Discharge status: Home / Self Care | Payer: PPO | Source: Ambulatory Visit | Attending: Gastroenterology | Admitting: Gastroenterology

## 2018-08-01 ENCOUNTER — Ambulatory Visit (HOSPITAL_COMMUNITY): Payer: PPO

## 2018-08-01 ENCOUNTER — Encounter (HOSPITAL_COMMUNITY): Payer: Self-pay

## 2018-08-01 ENCOUNTER — Encounter (HOSPITAL_COMMUNITY): Payer: PPO

## 2018-08-01 DIAGNOSIS — R1310 Dysphagia, unspecified: Secondary | ICD-10-CM | POA: Insufficient documentation

## 2018-08-01 DIAGNOSIS — R05 Cough: Secondary | ICD-10-CM | POA: Diagnosis not present

## 2018-08-01 NOTE — Progress Notes (Signed)
Modified Barium Swallow Progress Note  Patient Details  Name: SAFIYA GIRDLER MRN: [id number] Date of Birth: 08-08-1928  Today's Date: 08/01/2018  Modified Barium Swallow completed.  Full report located under Chart Review in the Imaging Section.  Brief recommendations include the following:  Clinical Impression  Pt presents with normal oral and oropharyngeal function. No penetration or aspiration occurred and coughing actually ceased during testing. Esophageal sweep relatively unremarkable, but MBS is not diagnostic for esophageal dysphagia or reflux; pill passed. Introduced concepts related to hypersensitive cough syndrome given possible reflux source that is also being triggered by talking and textured foods that may be a hypersensitive laryngeal response to touch. She also reports being able to supress her cough at home with effort. Advised pt to hydrate frequently and use vocal rest. She may benefit from f/u with SLP experienced in this area.    Swallow Evaluation Recommendations       SLP Diet Recommendations: Regular solids;Thin liquid   Liquid Administration via: Cup;Straw   Medication Administration: Whole meds with liquid   Supervision: Patient able to self feed   Compensations: Follow solids with liquid   Postural Changes: Seated upright at 90 degrees   Oral Care Recommendations: Oral care BID       Herbie Baltimore, MA CCC-SLP  Acute Rehabilitation Services Pager [phone] Office [phone]  Krissie Merrick, Katherene Ponto 08/01/2018,2:27 PM

## 2018-08-02 ENCOUNTER — Encounter (HOSPITAL_COMMUNITY): Payer: Self-pay

## 2018-08-02 ENCOUNTER — Ambulatory Visit (HOSPITAL_COMMUNITY): Payer: PPO

## 2018-08-02 ENCOUNTER — Encounter (HOSPITAL_COMMUNITY): Payer: PPO

## 2018-08-08 ENCOUNTER — Other Ambulatory Visit: Payer: Self-pay

## 2018-08-08 ENCOUNTER — Encounter: Payer: Self-pay | Admitting: Pulmonary Disease

## 2018-08-08 ENCOUNTER — Ambulatory Visit: Payer: PPO | Admitting: Pulmonary Disease

## 2018-08-08 DIAGNOSIS — R0602 Shortness of breath: Secondary | ICD-10-CM

## 2018-08-08 LAB — SEDIMENTATION RATE: Sed Rate: 34 mm/hr — ABNORMAL HIGH (ref 0–30)

## 2018-08-08 LAB — C-REACTIVE PROTEIN: CRP: 1.1 mg/dL (ref 0.5–20.0)

## 2018-08-08 NOTE — Progress Notes (Signed)
Subjective:    Patient ID: Virginia Bullock, female    DOB: 20-May-1928, 85 y.o.   MRN: [id number]  Patient with a couple years history of cough  Was recently treated for pneumonia Required 2 rounds of antibiotics  Being treated for reflux-feels symptoms are better  Cough is generally better  She started taking aloe-feels it is helping  Cough is productive of clear phlegm Denies nasal stuffiness but does complain of postnasal drip  Recently had barium swallow which was negative for significant aspiration  Cough is worse in the evening, worse with talking and sometimes around mealtime  Non-smoker   Past Medical History:  Diagnosis Date  . Arthritis    right hip  . Basal cell carcinoma of skin   . Complication of anesthesia    "epidural with 5th baby-stopped breathing and went numb, 'died'"  . Diverticulosis of colon without hemorrhage 02/27/2014  . Goiter   . History of herpes zoster 02/27/2014  . History of shingles    x2  . Hypothyroidism   . Impaired hearing   . Loss of hearing 02/27/2014   both  . Migraine headache 02/27/2014   migraines, aura without pain  . OA (osteoarthritis) of hip 02/20/2014  . Osteopenia 02/27/2014  . Post-nasal drip    Family History  Problem Relation Age of Onset  . Cancer Maternal Grandmother   . Tuberculosis Maternal Grandfather     Review of Systems  Constitutional: Positive for unexpected weight change. Negative for fever.  HENT: Negative for congestion, dental problem, ear pain, nosebleeds, postnasal drip, rhinorrhea, sinus pressure, sneezing, sore throat and trouble swallowing.   Eyes: Negative for redness and itching.  Respiratory: Positive for cough and shortness of breath. Negative for chest tightness and wheezing.   Cardiovascular: Negative for palpitations and leg swelling.  Gastrointestinal: Negative for nausea and vomiting.  Genitourinary: Negative for dysuria.  Musculoskeletal: Negative for joint swelling.  Skin:  Negative for rash.  Allergic/Immunologic: Negative.  Negative for environmental allergies, food allergies and immunocompromised state.  Neurological: Negative for headaches.  Hematological: Does not bruise/bleed easily.  Psychiatric/Behavioral: Negative for dysphoric mood. The patient is not nervous/anxious.       Objective:   Physical Exam Constitutional:      Appearance: Normal appearance.  HENT:     Head: Normocephalic and atraumatic.  Cardiovascular:     Rate and Rhythm: Normal rate and regular rhythm.     Pulses: Normal pulses.     Heart sounds: Normal heart sounds. No murmur. No friction rub.  Pulmonary:     Effort: Pulmonary effort is normal. No respiratory distress.     Breath sounds: No stridor. Rales present.     Comments: Does have Velcro rales at the bases Abdominal:     General: There is no distension.     Tenderness: There is no abdominal tenderness.  Musculoskeletal: Normal range of motion.        General: No swelling or tenderness.  Skin:    General: Skin is warm and dry.     Coloration: Skin is not jaundiced or pale.     Findings: No bruising.  Neurological:     General: No focal deficit present.     Mental Status: She is alert and oriented to person, place, and time.  Psychiatric:        Mood and Affect: Mood normal.        Behavior: Behavior normal.    BP 108/62 (BP Location: Left Arm, Cuff Size:  Normal)   Pulse 83   Temp 97.7 F (36.5 C) (Oral)   Ht _0  (1.575 m)   Wt 127 lb 9.6 oz (57.9 kg)   SpO2 96%   BMI 23.34 kg/m   Chest x-ray reviewed, chest x-ray from 2018 reviewed as well-prominent interstitial markings likely related to scarring    Assessment & Plan:  .  Chronic cough .  Gastroesophageal reflux disease .  Shortness of breath with activity  Chronic cough likely related to interstitial lung disease  Plan: .  Obtain high-resolution CT scan of the chest .  Continue cough suppressants .  Continue antireflux medications .  Obtain  pulmonary function study .  Obtain ANA, ESR, rheumatoid factor, anti-CCP, CRP We will follow in about 4 to 6 weeks

## 2018-08-08 NOTE — Patient Instructions (Signed)
Shortness of breath and chronic cough  I do think you have scarring in your lungs  We will get some blood work CT scan of the chest to further characterize the scarring Breathing study to evaluate your lung function  We will see you back in about 4-6 weeks Continue to stay active Call with significant concerns

## 2018-08-10 LAB — RHEUMATOID FACTOR: Rheumatoid fact SerPl-aCnc: 22 IU/mL — ABNORMAL HIGH (ref <14)

## 2018-08-10 LAB — CYCLIC CITRUL PEPTIDE ANTIBODY, IGG: Cyclic Citrullin Peptide Ab: <16 UNITS

## 2018-08-10 LAB — ANA: Anti Nuclear Antibody (ANA): NEGATIVE

## 2018-08-14 DIAGNOSIS — R1314 Dysphagia, pharyngoesophageal phase: Secondary | ICD-10-CM | POA: Diagnosis not present

## 2018-08-14 DIAGNOSIS — K219 Gastro-esophageal reflux disease without esophagitis: Secondary | ICD-10-CM | POA: Diagnosis not present

## 2018-08-14 DIAGNOSIS — R634 Abnormal weight loss: Secondary | ICD-10-CM | POA: Diagnosis not present

## 2018-08-14 DIAGNOSIS — R05 Cough: Secondary | ICD-10-CM | POA: Diagnosis not present

## 2018-08-15 ENCOUNTER — Other Ambulatory Visit: Payer: Self-pay | Admitting: Physician Assistant

## 2018-08-15 DIAGNOSIS — R1314 Dysphagia, pharyngoesophageal phase: Secondary | ICD-10-CM

## 2018-08-16 ENCOUNTER — Ambulatory Visit
Admission: RE | Admit: 2018-08-16 | Discharge: 2018-08-16 | Disposition: A | Discharge status: Home / Self Care | Payer: PPO | Source: Ambulatory Visit | Attending: Physician Assistant | Admitting: Physician Assistant

## 2018-08-16 ENCOUNTER — Other Ambulatory Visit: Payer: Self-pay | Admitting: Physician Assistant

## 2018-08-16 DIAGNOSIS — K224 Dyskinesia of esophagus: Secondary | ICD-10-CM | POA: Diagnosis not present

## 2018-08-16 DIAGNOSIS — R1314 Dysphagia, pharyngoesophageal phase: Secondary | ICD-10-CM

## 2018-08-20 ENCOUNTER — Ambulatory Visit (INDEPENDENT_AMBULATORY_CARE_PROVIDER_SITE_OTHER)
Admission: RE | Admit: 2018-08-20 | Discharge: 2018-08-20 | Disposition: A | Discharge status: Home / Self Care | Payer: PPO | Source: Ambulatory Visit | Attending: Pulmonary Disease | Admitting: Pulmonary Disease

## 2018-08-20 ENCOUNTER — Other Ambulatory Visit: Payer: Self-pay

## 2018-08-20 DIAGNOSIS — J398 Other specified diseases of upper respiratory tract: Secondary | ICD-10-CM | POA: Diagnosis not present

## 2018-08-20 DIAGNOSIS — R0602 Shortness of breath: Secondary | ICD-10-CM

## 2018-08-20 DIAGNOSIS — J479 Bronchiectasis, uncomplicated: Secondary | ICD-10-CM | POA: Diagnosis not present

## 2018-08-26 ENCOUNTER — Telehealth: Payer: Self-pay | Admitting: Pulmonary Disease

## 2018-08-26 NOTE — Telephone Encounter (Signed)
Pt called requesting to know the results of her HRCT. Dr. Ander Slade, please advise on this for pt. Thanks!

## 2018-08-26 NOTE — Telephone Encounter (Signed)
Called spoke with patient regarding Dr. Judson Roch rec , patient verbalized understanding, and requested a copy be sent to her house. I tried to encourage her to sign up for mychart, she did not want too,. Copy printed and placed in envelope.  Nothing needed at this time.

## 2018-08-26 NOTE — Telephone Encounter (Signed)
CT scan of the chest is consistent with pulmonary fibrosis as discussed while she was in the office  Confirms the diagnosis of scarring  Follow-up as scheduled

## 2018-09-04 DIAGNOSIS — J8410 Pulmonary fibrosis, unspecified: Secondary | ICD-10-CM | POA: Diagnosis not present

## 2018-09-04 DIAGNOSIS — E441 Mild protein-calorie malnutrition: Secondary | ICD-10-CM | POA: Diagnosis not present

## 2018-09-04 DIAGNOSIS — R05 Cough: Secondary | ICD-10-CM | POA: Diagnosis not present

## 2018-09-04 DIAGNOSIS — K224 Dyskinesia of esophagus: Secondary | ICD-10-CM | POA: Diagnosis not present

## 2018-09-09 ENCOUNTER — Ambulatory Visit (INDEPENDENT_AMBULATORY_CARE_PROVIDER_SITE_OTHER): Payer: PPO | Admitting: Pulmonary Disease

## 2018-09-09 ENCOUNTER — Other Ambulatory Visit: Payer: Self-pay

## 2018-09-09 ENCOUNTER — Encounter: Payer: Self-pay | Admitting: Pulmonary Disease

## 2018-09-09 VITALS — BP 108/60 | HR 70 | Temp 97.8°F | Ht 62.0 in | Wt 122.4 lb

## 2018-09-09 DIAGNOSIS — J8410 Pulmonary fibrosis, unspecified: Secondary | ICD-10-CM | POA: Diagnosis not present

## 2018-09-09 MED ORDER — PREDNISONE 5 MG PO TABS: 5.0000 mg | ORAL_TABLET | Freq: Two times a day (BID) | ORAL | 0 refills | Status: DC

## 2018-09-09 NOTE — Patient Instructions (Addendum)
Pulmonary fibrosis  Empiric steroids to see if this helps symptoms-prednisone 5 p.o. twice daily  Obtain liver function tests Pulmonary function study  I will see you back in about 4 weeks  Call with any significant concerns

## 2018-09-09 NOTE — Progress Notes (Signed)
Subjective:    Patient ID: Virginia Bullock, female    DOB: Oct 23, 1928, 85 y.o.   MRN: [id number]  Patient with a couple years history of cough  She continues to cough with worsening symptoms States cough medicines are not really helping She states she derive some benefit from using Tylenol  Cough has become increasingly limiting  Was recently treated for pneumonia, 2 rounds of antibiotics  Being treated for reflux-feels symptoms are better  Cough is generally better  She started taking aloe-feels it is helping  Cough is productive of clear phlegm Denies nasal stuffiness but does complain of postnasal drip  Recently had barium swallow which was negative for significant aspiration  Cough is worse in the evening, worse with talking and sometimes around mealtime  Non-smoker   Past Medical History:  Diagnosis Date  . Arthritis    right hip  . Basal cell carcinoma of skin   . Complication of anesthesia    "epidural with 5th baby-stopped breathing and went numb, 'died'"  . Diverticulosis of colon without hemorrhage 02/27/2014  . Goiter   . History of herpes zoster 02/27/2014  . History of shingles    x2  . Hypothyroidism   . Impaired hearing   . Loss of hearing 02/27/2014   both  . Migraine headache 02/27/2014   migraines, aura without pain  . OA (osteoarthritis) of hip 02/20/2014  . Osteopenia 02/27/2014  . Post-nasal drip    Family History  Problem Relation Age of Onset  . Cancer Maternal Grandmother   . Tuberculosis Maternal Grandfather     Review of Systems  Constitutional: Positive for unexpected weight change. Negative for fever.  HENT: Positive for postnasal drip. Negative for congestion, dental problem, ear pain, nosebleeds, rhinorrhea, sinus pressure, sneezing, sore throat and trouble swallowing.   Eyes: Negative for redness and itching.  Respiratory: Positive for cough and shortness of breath. Negative for chest tightness and wheezing.   Cardiovascular:  Negative for palpitations and leg swelling.  Gastrointestinal: Negative for nausea and vomiting.  Genitourinary: Negative for dysuria.  Musculoskeletal: Negative for joint swelling.  Skin: Negative for rash.  Allergic/Immunologic: Negative.  Negative for environmental allergies, food allergies and immunocompromised state.  Neurological: Positive for headaches.  Psychiatric/Behavioral: The patient is nervous/anxious.       Objective:   Physical Exam Constitutional:      Appearance: Normal appearance.  HENT:     Head: Normocephalic and atraumatic.  Cardiovascular:     Rate and Rhythm: Normal rate and regular rhythm.     Pulses: Normal pulses.     Heart sounds: Normal heart sounds. No murmur. No friction rub.  Pulmonary:     Effort: Pulmonary effort is normal. No respiratory distress.     Breath sounds: No stridor. Rales present.     Comments: Does have Velcro rales at the bases Abdominal:     General: There is no distension.     Tenderness: There is no abdominal tenderness.  Musculoskeletal: Normal range of motion.        General: No swelling or tenderness.  Neurological:     Mental Status: She is alert.    BP 108/60 (BP Location: Left Arm, Cuff Size: Normal)   Pulse 70   Temp 97.8 F (36.6 C) (Temporal)   Ht '5\' 2"'  (1.575 m)   Wt 122 lb 6.4 oz (55.5 kg)   SpO2 95%   BMI 22.39 kg/m   Chest x-ray reviewed, chest x-ray from 2018 reviewed as  well-prominent interstitial markings likely related to scarring High-resolution CT scan of the chest does show evidence of pulmonary fibrosis-likely IPF  Barium swallow results noted    Esophagogram noted  ESR is elevated, other markers of inflammation negative  Assessment & Plan:  .  Chronic cough -Worsening symptoms with more limitations -Has severe spasms sometimes that limits her ability to function  .  Gastroesophageal reflux disease-studies are not confirming  .  Shortness of breath with activity  Chronic cough likely  related to interstitial lung disease  Plan: .  Obtain liver function tests .  Continue cough suppressants .  Continue antireflux medications .  Obtain pulmonary function study  With worsening symptoms and increasing limitations We agreed on trying low-dose steroids-I did discuss that this will not necessarily make the fibrosis better but may help her symptoms  We will obtain liver function tests, obtain pulmonary function study An anti-fibrotic may be considered I did make sure the patient is aware that this is advanced disease and anti-fibrotics may be a little bit late in the process as well

## 2018-09-10 LAB — HEPATIC FUNCTION PANEL
ALT: 9 U/L (ref 0–35)
AST: 22 U/L (ref 0–37)
Albumin: 4.1 g/dL (ref 3.5–5.2)
Alkaline Phosphatase: 62 U/L (ref 39–117)
Bilirubin, Direct: 0.1 mg/dL (ref 0.0–0.3)
Total Bilirubin: 0.5 mg/dL (ref 0.2–1.2)
Total Protein: 7.8 g/dL (ref 6.0–8.3)

## 2018-09-17 ENCOUNTER — Telehealth: Payer: Self-pay | Admitting: Pulmonary Disease

## 2018-09-19 NOTE — Telephone Encounter (Signed)
Scheduled pft on 10/10/2018-pr

## 2018-09-24 ENCOUNTER — Telehealth: Payer: Self-pay | Admitting: Pulmonary Disease

## 2018-09-24 NOTE — Telephone Encounter (Signed)
Called to check up on patient  Left message for her to call back if any issues

## 2018-09-30 ENCOUNTER — Ambulatory Visit: Payer: PPO | Admitting: Pulmonary Disease

## 2018-10-01 ENCOUNTER — Other Ambulatory Visit: Payer: Self-pay | Admitting: Pulmonary Disease

## 2018-10-01 ENCOUNTER — Telehealth: Payer: Self-pay | Admitting: Pulmonary Disease

## 2018-10-01 NOTE — Telephone Encounter (Signed)
Called to check on patient  She feels a little bit better  The steroids seem to be helping her, she is still short of breath, but better  Did discuss cutting down on steroids-she wants to leave it at the current dose until she comes in on Tuesday for follow-up appointment  Has a PFT ordered

## 2018-10-07 ENCOUNTER — Other Ambulatory Visit (HOSPITAL_COMMUNITY)
Admission: RE | Admit: 2018-10-07 | Discharge: 2018-10-07 | Disposition: A | Discharge status: Home / Self Care | Payer: PPO | Source: Ambulatory Visit | Attending: Pulmonary Disease | Admitting: Pulmonary Disease

## 2018-10-07 DIAGNOSIS — Z20828 Contact with and (suspected) exposure to other viral communicable diseases: Secondary | ICD-10-CM | POA: Insufficient documentation

## 2018-10-07 DIAGNOSIS — Z01812 Encounter for preprocedural laboratory examination: Secondary | ICD-10-CM | POA: Insufficient documentation

## 2018-10-08 LAB — NOVEL CORONAVIRUS, NAA (HOSP ORDER, SEND-OUT TO REF LAB; TAT 18-24 HRS): SARS-CoV-2, NAA: NOT DETECTED

## 2018-10-10 ENCOUNTER — Encounter: Payer: Self-pay | Admitting: Pulmonary Disease

## 2018-10-10 ENCOUNTER — Ambulatory Visit: Payer: PPO

## 2018-10-10 ENCOUNTER — Other Ambulatory Visit: Payer: Self-pay

## 2018-10-10 ENCOUNTER — Ambulatory Visit: Payer: PPO | Admitting: Pulmonary Disease

## 2018-10-10 VITALS — BP 114/62 | HR 54 | Ht 62.0 in | Wt 121.0 lb

## 2018-10-10 DIAGNOSIS — J8410 Pulmonary fibrosis, unspecified: Secondary | ICD-10-CM

## 2018-10-10 DIAGNOSIS — R0602 Shortness of breath: Secondary | ICD-10-CM | POA: Diagnosis not present

## 2018-10-10 NOTE — Patient Instructions (Signed)
Pulmonary fibrosis  Continue prednisone at 5 mg daily Take calcium and vitamin D to support your bones Continue to stay active  Call with significant concerns

## 2018-10-10 NOTE — Progress Notes (Signed)
Subjective:    Patient ID: Virginia Bullock, female    DOB: [birth year], 85 y.o.   MRN: [id number]  Patient with a couple years history of cough  Was recently treated for pneumonia Required 2 rounds of antibiotics  Since last visit has been on prednisone 5 mg twice daily Reduced it to 5 mg daily in the last week This seem to be helping a cough Does not have as much burning/tightness in the middle of the chest  Being treated for reflux-feels symptoms are better  Cough is generally better  Cough is productive of clear phlegm in the mornings  Denies nasal stuffiness but does complain of postnasal drip  Recently had barium swallow which was negative for significant aspiration  Cough is worse in the evening, worse with talking and sometimes around mealtime  Non-smoker     Past Medical History:  Diagnosis Date  . Arthritis    right hip  . Basal cell carcinoma of skin   . Complication of anesthesia    "epidural with 5th baby-stopped breathing and went numb, 'died'"  . Diverticulosis of colon without hemorrhage 02/27/2014  . Goiter   . History of herpes zoster 02/27/2014  . History of shingles    x2  . Hypothyroidism   . Impaired hearing   . Loss of hearing 02/27/2014   both  . Migraine headache 02/27/2014   migraines, aura without pain  . OA (osteoarthritis) of hip 02/20/2014  . Osteopenia 02/27/2014  . Post-nasal drip    Family History  Problem Relation Age of Onset  . Cancer Maternal Grandmother   . Tuberculosis Maternal Grandfather     Review of Systems  Constitutional: Negative for fever and unexpected weight change.  HENT: Negative for congestion, dental problem, ear pain, nosebleeds, postnasal drip, rhinorrhea, sinus pressure, sneezing, sore throat and trouble swallowing.   Eyes: Negative for redness and itching.  Respiratory: Positive for cough and shortness of breath. Negative for chest tightness and wheezing.   Cardiovascular: Negative for palpitations and  leg swelling.  Gastrointestinal: Negative for nausea and vomiting.  Genitourinary: Negative for dysuria.  Musculoskeletal: Negative for joint swelling.  Skin: Negative for rash.  Allergic/Immunologic: Negative.  Negative for environmental allergies, food allergies and immunocompromised state.  Neurological: Negative for headaches.  Hematological: Does not bruise/bleed easily.  Psychiatric/Behavioral: Negative for dysphoric mood. The patient is not nervous/anxious.       Objective:   Physical Exam Constitutional:      Appearance: Normal appearance.  HENT:     Head: Normocephalic and atraumatic.  Cardiovascular:     Rate and Rhythm: Normal rate and regular rhythm.     Pulses: Normal pulses.     Heart sounds: Normal heart sounds. No murmur. No friction rub.  Pulmonary:     Effort: Pulmonary effort is normal. No respiratory distress.     Breath sounds: No stridor. Rales present. No wheezing or rhonchi.     Comments: Does have Velcro rales at the bases Abdominal:     General: There is no distension.     Tenderness: There is no abdominal tenderness.  Musculoskeletal: Normal range of motion.  Skin:    General: Skin is dry.  Neurological:     Mental Status: She is alert.  Psychiatric:        Mood and Affect: Mood normal.    BP 114/62 (BP Location: Left Arm, Cuff Size: Normal)   Pulse (!) 54   Ht 5\' 2"  (1.575 m)   Wt  121 lb (54.9 kg)   SpO2 94%   BMI 22.13 kg/m   Chest x-ray reviewed, chest x-ray from 2018 reviewed as well-prominent interstitial markings likely related to scarring  High-resolution CT scan of the chest reviewed-evidence of fibrosis      Assessment & Plan:  .  Chronic cough .  Gastroesophageal reflux disease .  Shortnes of breath with activity .  Pulmonary fibrosis-advanced   .  Patient could not perform a pulmonary function test today-started coughing, too stressful  .  She does not want to be evaluated for oxygen need-she does not want to use oxygen,  she is fully aware that she may need oxygen as she gets short of breath with most activities  .  She is aware that she has a progressive disease with limited outlook  .  We did discuss her outlook a little bit better, talked about ILD exacerbation -She will not want to be on a ventilator   Chronic cough likely related to interstitial lung disease  Plan: .  Continue low-dose steroids .  Encouraged to use vitamin D and calcium .  Continue cough suppressants .  Continue antireflux medications  .  We will see again in about 6 weeks

## 2018-10-14 DIAGNOSIS — H00011 Hordeolum externum right upper eyelid: Secondary | ICD-10-CM | POA: Diagnosis not present

## 2018-10-17 ENCOUNTER — Other Ambulatory Visit: Payer: Self-pay | Admitting: Pulmonary Disease

## 2018-11-11 DIAGNOSIS — H00015 Hordeolum externum left lower eyelid: Secondary | ICD-10-CM | POA: Diagnosis not present

## 2018-11-11 DIAGNOSIS — H00014 Hordeolum externum left upper eyelid: Secondary | ICD-10-CM | POA: Diagnosis not present

## 2018-11-11 DIAGNOSIS — H00012 Hordeolum externum right lower eyelid: Secondary | ICD-10-CM | POA: Diagnosis not present

## 2018-11-11 DIAGNOSIS — H00011 Hordeolum externum right upper eyelid: Secondary | ICD-10-CM | POA: Diagnosis not present

## 2018-11-18 ENCOUNTER — Encounter: Payer: Self-pay | Admitting: Pulmonary Disease

## 2018-11-18 ENCOUNTER — Other Ambulatory Visit: Payer: Self-pay

## 2018-11-18 ENCOUNTER — Ambulatory Visit: Payer: PPO | Admitting: Pulmonary Disease

## 2018-11-18 VITALS — BP 116/66 | HR 94 | Ht 62.0 in | Wt 119.6 lb

## 2018-11-18 DIAGNOSIS — R0602 Shortness of breath: Secondary | ICD-10-CM | POA: Diagnosis not present

## 2018-11-18 DIAGNOSIS — J8410 Pulmonary fibrosis, unspecified: Secondary | ICD-10-CM

## 2018-11-18 NOTE — Patient Instructions (Signed)
Pulmonary fibrosis  Symptoms are currently stable  Continue 5 mg prednisone daily  May bump prednisone up to 10 mg for 5 to 7 days if needed with exacerbations-increase shortness of breath/cough  Call with significant concerns  I will see you back in 6 weeks

## 2018-11-18 NOTE — Progress Notes (Signed)
Subjective:    Patient ID: Virginia Bullock, female    DOB: January 09, 1929, 85 y.o.   MRN: [id number]  Patient with a couple years history of cough  Was recently treated for pneumonia Required 2 rounds of antibiotics  Known to have pulmonary fibrosis Has been doing well with just 5 mg of prednisone daily Feels breathing is a little bit better Cough is also a little bit better  Since last visit has been on prednisone 5 mg twice daily Reduced it to 5 mg daily in the last week This seem to be helping a cough Does not have as much burning/tightness in the middle of the chest  Being treated for reflux-feels symptoms are better  Cough is productive of clear phlegm in the mornings Cough is mostly around the time after a meal  Denies nasal stuffiness but does complain of postnasal drip  Recently had barium swallow which was negative for significant aspiration  Cough is worse in the evening, worse with talking and sometimes around mealtime  Non-smoker     Past Medical History:  Diagnosis Date  . Arthritis    right hip  . Basal cell carcinoma of skin   . Complication of anesthesia    "epidural with 5th baby-stopped breathing and went numb, 'died'"  . Diverticulosis of colon without hemorrhage 02/27/2014  . Goiter   . History of herpes zoster 02/27/2014  . History of shingles    x2  . Hypothyroidism   . Impaired hearing   . Loss of hearing 02/27/2014   both  . Migraine headache 02/27/2014   migraines, aura without pain  . OA (osteoarthritis) of hip 02/20/2014  . Osteopenia 02/27/2014  . Post-nasal drip    Family History  Problem Relation Age of Onset  . Cancer Maternal Grandmother   . Tuberculosis Maternal Grandfather     Review of Systems  Constitutional: Negative for fever and unexpected weight change.  HENT: Negative for congestion, dental problem, ear pain, nosebleeds, postnasal drip, rhinorrhea, sinus pressure, sneezing, sore throat and trouble swallowing.   Eyes:  Negative for redness and itching.  Respiratory: Positive for cough and shortness of breath. Negative for chest tightness and wheezing.   Cardiovascular: Negative for palpitations and leg swelling.  Gastrointestinal: Negative for nausea and vomiting.  Genitourinary: Negative for dysuria.  Musculoskeletal: Negative for joint swelling.  Skin: Negative for rash.  Allergic/Immunologic: Negative.  Negative for environmental allergies, food allergies and immunocompromised state.  All other systems reviewed and are negative.     Objective:   Physical Exam Constitutional:      Appearance: Normal appearance.  HENT:     Head: Normocephalic and atraumatic.  Cardiovascular:     Rate and Rhythm: Normal rate and regular rhythm.     Pulses: Normal pulses.     Heart sounds: Normal heart sounds. No murmur. No friction rub.  Pulmonary:     Effort: Pulmonary effort is normal. No respiratory distress.     Breath sounds: No stridor. Rales present. No wheezing or rhonchi.     Comments: Does have Velcro rales at the bases Abdominal:     General: There is no distension.     Tenderness: There is no abdominal tenderness.  Neurological:     Mental Status: She is alert.    BP 116/66 (BP Location: Left Arm, Cuff Size: Normal)   Pulse 94   Ht 5\' 2"  (1.575 m)   Wt 119 lb 9.6 oz (54.3 kg)   SpO2 95%  BMI 21.88 kg/m   Chest x-ray reviewed, chest x-ray from 2018 reviewed as well-prominent interstitial markings likely related to scarring  High-resolution CT scan of the chest reviewed-evidence of fibrosis      Assessment & Plan:  .  Advanced pulmonary fibrosis .  Shortness of breath with activity .  Gastroesophageal reflux .  Chronic cough .  Shortness of breath -Overall better  .  Was unable to perform a PFT secondary to persistent cough .  Want oxygen supplementation   .  Aware of progressive disease with limited outlook -She does not want to be on a ventilator   Chronic cough likely  related to interstitial lung disease  Plan: .  Continue low-dose steroids -Currently on 5 mg daily, may bump it up to 10 mg for 5 to 7 days if needed  .  Encouraged to use vitamin D and calcium .  Continue cough suppressants .  Continue antireflux medications  .  To follow-up in about 6 weeks .  Call with significant concerns

## 2018-11-21 LAB — PULMONARY FUNCTION TEST
FEF 25-75 Pre: 1.5 L/sec
FEF2575-%Pred-Pre: 171 %
FEV1-%Pred-Pre: 95 %
FEV1-Pre: 1.39 L
FEV1FVC-%Pred-Pre: 108 %
FEV6-%Pred-Pre: 92 %
FEV6-Pre: 1.73 L
FEV6FVC-%Pred-Pre: 107 %
FVC-%Pred-Pre: 88 %
FVC-Pre: 1.77 L
Pre FEV1/FVC ratio: 79 %
Pre FEV6/FVC Ratio: 100 %

## 2018-12-10 ENCOUNTER — Other Ambulatory Visit: Payer: Self-pay | Admitting: Pulmonary Disease

## 2018-12-30 ENCOUNTER — Telehealth: Payer: Self-pay | Admitting: Pulmonary Disease

## 2018-12-30 ENCOUNTER — Encounter: Payer: Self-pay | Admitting: Pulmonary Disease

## 2018-12-30 ENCOUNTER — Other Ambulatory Visit: Payer: Self-pay

## 2018-12-30 ENCOUNTER — Ambulatory Visit: Payer: PPO | Admitting: Pulmonary Disease

## 2018-12-30 VITALS — BP 122/66 | HR 85 | Ht 62.0 in | Wt 125.0 lb

## 2018-12-30 DIAGNOSIS — J8410 Pulmonary fibrosis, unspecified: Secondary | ICD-10-CM | POA: Diagnosis not present

## 2018-12-30 MED ORDER — ACETYLCYSTEINE 600 MG PO CAPS: 600.0000 mg | ORAL_CAPSULE | Freq: Two times a day (BID) | ORAL | 0 refills | Status: DC

## 2018-12-30 NOTE — Patient Instructions (Signed)
Pulmonary fibrosis -Stable symptoms  Continue prednisone Continue Protonix for your reflux  N-acetylcysteine may help with the cough and sputum clearance--I did send in the prescription if you able to get it cheaper with a prescription  I will follow-up with you in 2 months

## 2018-12-30 NOTE — Progress Notes (Signed)
Subjective:    Patient ID: Virginia Bullock, female    DOB: 10-17-1928, 85 y.o.   MRN: [id number]  Patient with a couple years history of cough  She has been doing well She recently was seen for a pneumonia that required multiple rounds of antibiotics  Known to have pulmonary fibrosis Has been doing well with just 5 mg of prednisone daily Feels breathing is a little bit better Cough is also a little bit better  She feels having reflux gives her more symptoms currently She does have a cough Sputum can be thick occasionally, clear, no fevers or chills Cough is mostly around mealtime  Since last visit has been on prednisone 5 mg twice daily Reduced it to 5 mg daily in the last week This seem to be helping a cough Does not have as much burning/tightness in the middle of the chest  Denies nasal stuffiness but does complain of postnasal drip  Recently had barium swallow which was negative for significant aspiration  Cough is worse in the evening, worse with talking and sometimes around mealtime  Non-smoker     Past Medical History:  Diagnosis Date  . Arthritis    right hip  . Basal cell carcinoma of skin   . Complication of anesthesia    "epidural with 5th baby-stopped breathing and went numb, 'died'"  . Diverticulosis of colon without hemorrhage 02/27/2014  . Goiter   . History of herpes zoster 02/27/2014  . History of shingles    x2  . Hypothyroidism   . Impaired hearing   . Loss of hearing 02/27/2014   both  . Migraine headache 02/27/2014   migraines, aura without pain  . OA (osteoarthritis) of hip 02/20/2014  . Osteopenia 02/27/2014  . Post-nasal drip    Family History  Problem Relation Age of Onset  . Cancer Maternal Grandmother   . Tuberculosis Maternal Grandfather     Review of Systems  Constitutional: Negative for fever and unexpected weight change.  HENT: Negative for congestion, dental problem, ear pain, nosebleeds, postnasal drip, rhinorrhea, sinus  pressure, sneezing, sore throat and trouble swallowing.   Eyes: Negative for redness and itching.  Respiratory: Positive for cough and shortness of breath. Negative for chest tightness and wheezing.   Cardiovascular: Negative for palpitations and leg swelling.  Gastrointestinal: Negative for nausea and vomiting.  Genitourinary: Negative for dysuria.  Musculoskeletal: Negative for joint swelling.  Skin: Negative for rash.  Allergic/Immunologic: Negative.  Negative for environmental allergies, food allergies and immunocompromised state.  All other systems reviewed and are negative.     Objective:   Physical Exam Constitutional:      Appearance: Normal appearance.  HENT:     Head: Normocephalic and atraumatic.  Cardiovascular:     Rate and Rhythm: Normal rate and regular rhythm.     Pulses: Normal pulses.     Heart sounds: Normal heart sounds. No murmur. No friction rub.  Pulmonary:     Effort: Pulmonary effort is normal. No respiratory distress.     Breath sounds: No stridor. Rales present. No wheezing or rhonchi.     Comments: Does have Velcro rales at the bases Abdominal:     General: There is no distension.     Tenderness: There is no abdominal tenderness.  Neurological:     Mental Status: She is alert.    BP 122/66 (BP Location: Right Arm, Cuff Size: Normal)   Pulse 85   Ht 5\' 2"  (1.575 m)   Wt  125 lb (56.7 kg)   SpO2 91% Comment: RA  BMI 22.86 kg/m   Chest x-ray reviewed, chest x-ray from 2018 reviewed as well-prominent interstitial markings likely related to scarring  High-resolution CT scan of the chest reviewed-evidence of fibrosis  Spirometry 10/10/2018 suggestive of restrictive disease, could not complete study    Assessment & Plan:  .  Patient with advanced pulmonary fibrosis .  Shortness of breath with activity .  Chronic cough with clear sputum production .  Gastroesophageal reflux  .  Still does not want to be on oxygen supplementation .  She is  relatively stable  .  Was unable to perform a PFT secondary to persistent cough   .  Aware of progressive nature of disease -She does not want to be on a ventilator -She did bring [place] a copy of her living well today   Chronic cough likely related to interstitial lung disease  Plan: .  Continue low-dose steroids -Continue at 5 mg daily  .  Encouraged to use vitamin D and calcium .  Continue cough suppressants .  Continue antireflux medications-on Protonix twice a day  .  May benefit from N-acetylcysteine for mucus clearance  .  To follow-up in about 2 months .  Call with significant concerns

## 2018-12-30 NOTE — Telephone Encounter (Signed)
Amber, is there any way yall could help [place] out with this?

## 2018-12-31 NOTE — Telephone Encounter (Signed)
Called and spoke with pt letting her know the info stated by [employer]. Pt verbalized understanding. Nothing further needed.

## 2018-12-31 NOTE — Telephone Encounter (Signed)
Yopp, Amber C, RPH-CPP  You 7 minutes ago (3:31 PM)   This an OTC product. They should be able to find on the shelf of the pharmacy could special order. Rachael contacted [facility] and looks it is available to be ordered. If patient wants to pick up at [facility] send in a prescription and they can order it. Th anks!  [employer] text      Attempted to call pt to let her know this message but unable to reach.left message for pt to return call.

## 2019-01-22 ENCOUNTER — Emergency Department (HOSPITAL_COMMUNITY): Payer: PPO

## 2019-01-22 ENCOUNTER — Inpatient Hospital Stay (HOSPITAL_COMMUNITY)
Admission: EM | Admit: 2019-01-22 | Discharge: 2019-01-29 | DRG: 309 | Disposition: A | Discharge status: Home / Self Care | Payer: PPO | Attending: Internal Medicine | Admitting: Internal Medicine

## 2019-01-22 ENCOUNTER — Encounter (HOSPITAL_COMMUNITY): Payer: Self-pay | Admitting: Emergency Medicine

## 2019-01-22 ENCOUNTER — Other Ambulatory Visit: Payer: Self-pay

## 2019-01-22 DIAGNOSIS — M25551 Pain in right hip: Secondary | ICD-10-CM | POA: Diagnosis not present

## 2019-01-22 DIAGNOSIS — K5730 Diverticulosis of large intestine without perforation or abscess without bleeding: Secondary | ICD-10-CM | POA: Diagnosis not present

## 2019-01-22 DIAGNOSIS — K5900 Constipation, unspecified: Secondary | ICD-10-CM | POA: Diagnosis present

## 2019-01-22 DIAGNOSIS — J8410 Pulmonary fibrosis, unspecified: Secondary | ICD-10-CM | POA: Diagnosis present

## 2019-01-22 DIAGNOSIS — Z79899 Other long term (current) drug therapy: Secondary | ICD-10-CM | POA: Diagnosis not present

## 2019-01-22 DIAGNOSIS — Z85828 Personal history of other malignant neoplasm of skin: Secondary | ICD-10-CM

## 2019-01-22 DIAGNOSIS — I080 Rheumatic disorders of both mitral and aortic valves: Secondary | ICD-10-CM | POA: Diagnosis not present

## 2019-01-22 DIAGNOSIS — Z8619 Personal history of other infectious and parasitic diseases: Secondary | ICD-10-CM

## 2019-01-22 DIAGNOSIS — D62 Acute posthemorrhagic anemia: Secondary | ICD-10-CM | POA: Diagnosis not present

## 2019-01-22 DIAGNOSIS — R0902 Hypoxemia: Secondary | ICD-10-CM | POA: Diagnosis not present

## 2019-01-22 DIAGNOSIS — Z96641 Presence of right artificial hip joint: Secondary | ICD-10-CM | POA: Diagnosis not present

## 2019-01-22 DIAGNOSIS — S32020A Wedge compression fracture of second lumbar vertebra, initial encounter for closed fracture: Secondary | ICD-10-CM | POA: Diagnosis present

## 2019-01-22 DIAGNOSIS — I4891 Unspecified atrial fibrillation: Principal | ICD-10-CM | POA: Diagnosis present

## 2019-01-22 DIAGNOSIS — W010XXA Fall on same level from slipping, tripping and stumbling without subsequent striking against object, initial encounter: Secondary | ICD-10-CM | POA: Diagnosis present

## 2019-01-22 DIAGNOSIS — Z888 Allergy status to other drugs, medicaments and biological substances status: Secondary | ICD-10-CM

## 2019-01-22 DIAGNOSIS — Z96643 Presence of artificial hip joint, bilateral: Secondary | ICD-10-CM | POA: Diagnosis not present

## 2019-01-22 DIAGNOSIS — K219 Gastro-esophageal reflux disease without esophagitis: Secondary | ICD-10-CM | POA: Diagnosis not present

## 2019-01-22 DIAGNOSIS — Z20822 Contact with and (suspected) exposure to covid-19: Secondary | ICD-10-CM | POA: Diagnosis not present

## 2019-01-22 DIAGNOSIS — H9190 Unspecified hearing loss, unspecified ear: Secondary | ICD-10-CM | POA: Diagnosis present

## 2019-01-22 DIAGNOSIS — Z87891 Personal history of nicotine dependence: Secondary | ICD-10-CM

## 2019-01-22 DIAGNOSIS — W19XXXA Unspecified fall, initial encounter: Secondary | ICD-10-CM | POA: Diagnosis not present

## 2019-01-22 DIAGNOSIS — Z66 Do not resuscitate: Secondary | ICD-10-CM | POA: Diagnosis not present

## 2019-01-22 DIAGNOSIS — E039 Hypothyroidism, unspecified: Secondary | ICD-10-CM | POA: Diagnosis present

## 2019-01-22 DIAGNOSIS — K921 Melena: Secondary | ICD-10-CM | POA: Diagnosis not present

## 2019-01-22 DIAGNOSIS — Z885 Allergy status to narcotic agent status: Secondary | ICD-10-CM

## 2019-01-22 DIAGNOSIS — R52 Pain, unspecified: Secondary | ICD-10-CM | POA: Diagnosis not present

## 2019-01-22 DIAGNOSIS — M25572 Pain in left ankle and joints of left foot: Secondary | ICD-10-CM | POA: Diagnosis not present

## 2019-01-22 DIAGNOSIS — S3993XA Unspecified injury of pelvis, initial encounter: Secondary | ICD-10-CM | POA: Diagnosis not present

## 2019-01-22 DIAGNOSIS — Z03818 Encounter for observation for suspected exposure to other biological agents ruled out: Secondary | ICD-10-CM | POA: Diagnosis not present

## 2019-01-22 DIAGNOSIS — Z471 Aftercare following joint replacement surgery: Secondary | ICD-10-CM | POA: Diagnosis not present

## 2019-01-22 LAB — BASIC METABOLIC PANEL
Anion gap: 8 (ref 5–15)
BUN: 20 mg/dL (ref 8–23)
CO2: 23 mmol/L (ref 22–32)
Calcium: 8.7 mg/dL — ABNORMAL LOW (ref 8.9–10.3)
Chloride: 108 mmol/L (ref 98–111)
Creatinine, Ser: 0.58 mg/dL (ref 0.44–1.00)
GFR calc Af Amer: >60 mL/min (ref >60)
GFR calc non Af Amer: >60 mL/min (ref >60)
Glucose, Bld: 86 mg/dL (ref 70–99)
Potassium: 4 mmol/L (ref 3.5–5.1)
Sodium: 139 mmol/L (ref 135–145)

## 2019-01-22 LAB — CBC WITH DIFFERENTIAL/PLATELET
Abs Immature Granulocytes: 0.07 10*3/uL (ref 0.00–0.07)
Basophils Absolute: 0 10*3/uL (ref 0.0–0.1)
Basophils Relative: 0 %
Eosinophils Absolute: 0.5 10*3/uL (ref 0.0–0.5)
Eosinophils Relative: 4 %
HCT: 35.8 % — ABNORMAL LOW (ref 36.0–46.0)
Hemoglobin: 11.7 g/dL — ABNORMAL LOW (ref 12.0–15.0)
Immature Granulocytes: 1 %
Lymphocytes Relative: 26 %
Lymphs Abs: 3.1 10*3/uL (ref 0.7–4.0)
MCH: 32.6 pg (ref 26.0–34.0)
MCHC: 32.7 g/dL (ref 30.0–36.0)
MCV: 99.7 fL (ref 80.0–100.0)
Monocytes Absolute: 1 10*3/uL (ref 0.1–1.0)
Monocytes Relative: 8 %
Neutro Abs: 7.5 10*3/uL (ref 1.7–7.7)
Neutrophils Relative %: 61 %
Platelets: 209 10*3/uL (ref 150–400)
RBC: 3.59 MIL/uL — ABNORMAL LOW (ref 3.87–5.11)
RDW: 12.9 % (ref 11.5–15.5)
WBC: 12.1 10*3/uL — ABNORMAL HIGH (ref 4.0–10.5)
nRBC: 0 % (ref 0.0–0.2)

## 2019-01-22 LAB — MAGNESIUM: Magnesium: 1.8 mg/dL (ref 1.7–2.4)

## 2019-01-22 LAB — TROPONIN I (HIGH SENSITIVITY): Troponin I (High Sensitivity): 17 ng/L (ref <18)

## 2019-01-22 LAB — TSH: TSH: 0.542 u[IU]/mL (ref 0.350–4.500)

## 2019-01-22 MED ORDER — BISACODYL 5 MG PO TBEC
5.0000 mg | DELAYED_RELEASE_TABLET | Freq: Every day | ORAL | Status: DC | PRN
  Administered 2019-01-23 – 2019-01-24 (×2): 5 mg via ORAL
  Filled 2019-01-22 (×2): qty 1

## 2019-01-22 MED ORDER — HYDROCODONE-ACETAMINOPHEN 5-325 MG PO TABS
1.0000 | ORAL_TABLET | ORAL | Status: DC | PRN
  Administered 2019-01-23 – 2019-01-29 (×22): 1 via ORAL
  Filled 2019-01-22 (×23): qty 1

## 2019-01-22 MED ORDER — PANTOPRAZOLE SODIUM 40 MG PO TBEC
40.0000 mg | DELAYED_RELEASE_TABLET | Freq: Two times a day (BID) | ORAL | Status: DC
  Administered 2019-01-23 – 2019-01-29 (×14): 40 mg via ORAL
  Filled 2019-01-22 (×14): qty 1

## 2019-01-22 MED ORDER — SODIUM CHLORIDE 0.9 % IV BOLUS
500.0000 mL | Freq: Once | INTRAVENOUS | Status: AC
  Administered 2019-01-23: 500 mL via INTRAVENOUS

## 2019-01-22 MED ORDER — POLYETHYLENE GLYCOL 3350 17 G PO PACK
17.0000 g | PACK | Freq: Every day | ORAL | Status: DC | PRN
  Administered 2019-01-24 – 2019-01-28 (×3): 17 g via ORAL
  Filled 2019-01-22 (×3): qty 1

## 2019-01-22 MED ORDER — RIVAROXABAN 15 MG PO TABS
15.0000 mg | ORAL_TABLET | Freq: Every day | ORAL | Status: DC
  Administered 2019-01-23 – 2019-01-25 (×4): 15 mg via ORAL
  Filled 2019-01-22 (×4): qty 1

## 2019-01-22 MED ORDER — HYDROMORPHONE HCL 1 MG/ML IJ SOLN
0.5000 mg | Freq: Once | INTRAMUSCULAR | Status: AC
  Administered 2019-01-22: 22:00:00 0.5 mg via INTRAVENOUS
  Filled 2019-01-22: qty 1

## 2019-01-22 MED ORDER — DILTIAZEM HCL-DEXTROSE 125-5 MG/125ML-% IV SOLN (PREMIX)
5.0000 mg/h | INTRAVENOUS | Status: DC
  Administered 2019-01-22: 22:00:00 5 mg/h via INTRAVENOUS
  Filled 2019-01-22: qty 125

## 2019-01-22 MED ORDER — ONDANSETRON HCL 4 MG/2ML IJ SOLN
4.0000 mg | Freq: Four times a day (QID) | INTRAMUSCULAR | Status: DC | PRN
  Administered 2019-01-26 – 2019-01-29 (×3): 4 mg via INTRAVENOUS
  Filled 2019-01-22 (×3): qty 2

## 2019-01-22 MED ORDER — ACETAMINOPHEN 325 MG PO TABS: 650.0000 mg | ORAL_TABLET | ORAL | Status: DC | PRN

## 2019-01-22 MED ORDER — ACETYLCYSTEINE 600 MG PO CAPS: 600.0000 mg | ORAL_CAPSULE | Freq: Two times a day (BID) | ORAL | Status: DC

## 2019-01-22 MED ORDER — DILTIAZEM HCL-DEXTROSE 125-5 MG/125ML-% IV SOLN (PREMIX)
5.0000 mg/h | INTRAVENOUS | Status: DC
  Administered 2019-01-24: 7.5 mg/h via INTRAVENOUS
  Administered 2019-01-24 – 2019-01-25 (×2): 5 mg/h via INTRAVENOUS
  Filled 2019-01-22 (×3): qty 125

## 2019-01-22 MED ORDER — MAGNESIUM SULFATE IN D5W 1-5 GM/100ML-% IV SOLN
1.0000 g | Freq: Once | INTRAVENOUS | Status: AC
  Administered 2019-01-23: 1 g via INTRAVENOUS
  Filled 2019-01-22: qty 100

## 2019-01-22 MED ORDER — HYDROMORPHONE HCL 1 MG/ML IJ SOLN
0.5000 mg | INTRAMUSCULAR | Status: DC | PRN
  Administered 2019-01-28: 0.5 mg via INTRAVENOUS
  Filled 2019-01-22: qty 0.5

## 2019-01-22 MED ORDER — SODIUM CHLORIDE 0.9 % IV SOLN: INTRAVENOUS | Status: AC

## 2019-01-22 NOTE — ED Notes (Signed)
ED Provider at bedside. 

## 2019-01-22 NOTE — ED Provider Notes (Signed)
[facility] DEPT Provider Note   CSN: [id number] Arrival date & time: 01/22/19  1638     History Chief Complaint  Patient presents with  . Fall  . Hip Pain    right    Virginia Bullock is a 86 y.o. female with PMHx impaired hearing, hypothyroidism, osteopenia, pulmonary fibrosis, total bilateral hip arthroplasty who presents to the ED today with complaint of sudden onset, constant, unchanged, right hip pain s/p mechanical fall that occurred 5 days ago.  She reports she was getting up off the couch when her left ankle gave out from underneath her and she fell onto her right hip.  Patient states that she was able to get up without assistance shortly afterwards and make her way to the kitchen to take Tylenol.  She states she has been taking Tylenol with mild relief although the pain is worsened after prolonged standing.  Patient states that she been able to ambulate on this but has been having pain which concerned her.  Patient does state that she has been on chronic prednisone for pulmonary fibrosis but she recently stopped taking it as her PCP told her that she could stop if she wanted to.  She denies any head injury or loss of consciousness.  He does mention that she has not had a bowel movement in 5 days since this incident occurred although she is still passing gas quite frequently.  She states she has tried all over-the-counter medications without relief.  No other complaints at this time.  Denies any low back pain.  No saddle anesthesia.  No urinary or bowel incontinence.  No urinary retention.  No abdominal pain, nausea, vomiting.   The history is provided by the patient.       Past Medical History:  Diagnosis Date  . Arthritis    right hip  . Basal cell carcinoma of skin   . Complication of anesthesia    "epidural with 5th baby-stopped breathing and went numb, 'died'"  . Diverticulosis of colon without hemorrhage 02/27/2014  . Goiter   . History of  herpes zoster 02/27/2014  . History of shingles    x2  . Hypothyroidism   . Impaired hearing   . Loss of hearing 02/27/2014   both  . Migraine headache 02/27/2014   migraines, aura without pain  . OA (osteoarthritis) of hip 02/20/2014  . Osteopenia 02/27/2014  . Post-nasal drip     Patient Active Problem List   Diagnosis Date Noted  . Atrial fibrillation with RVR ([place]) 01/22/2019  . Closed compression fracture of L2 lumbar vertebra, initial encounter ([place]) 01/22/2019  . Pulmonary fibrosis ([place]) 01/22/2019  . Rash and nonspecific skin eruption 02/27/2014  . Loss of hearing 02/27/2014  . Lactose intolerance 02/27/2014  . Migraine headache 02/27/2014  . Hypothyroidism 02/27/2014  . Osteopenia 02/27/2014  . OA (osteoarthritis) of hip 02/20/2014    Past Surgical History:  Procedure Laterality Date  . ABDOMINAL HYSTERECTOMY  2011   partial  . BASAL CELL CARCINOMA EXCISION    . CERVICAL SPINE SURGERY  24 years ago   2 ruptured disc  . EYE SURGERY Bilateral    lens replacements for cataract  . INNER EAR SURGERY Bilateral as child   double mastoid  . KNEE ARTHROSCOPY Left 30 years ago   "born with knee cap problem"  . MASTOIDECTOMY Bilateral    as a baby  . NASAL SEPTUM SURGERY    . TONSILLECTOMY  age 75 and 24  x2  . TOTAL HIP ARTHROPLASTY Right 02/20/2014   Procedure: RIGHT TOTAL HIP ARTHROPLASTY ANTERIOR APPROACH;  Surgeon: Gearlean Alf, MD;  Location: [facility];  Service: Orthopedics;  Laterality: Right;  . TOTAL HIP ARTHROPLASTY Left 12/23/2014   Procedure: LEFT TOTAL HIP ARTHROPLASTY ANTERIOR APPROACH;  Surgeon: Gaynelle Arabian, MD;  Location: [facility];  Service: Orthopedics;  Laterality: Left;     OB History   No obstetric history on file.     Family History  Problem Relation Age of Onset  . Cancer Maternal Grandmother   . Tuberculosis Maternal Grandfather     Social History   Tobacco Use  . Smoking status: Former Smoker    Types: Cigarettes  . Smokeless  tobacco: Never Used  . Tobacco comment: quit 25 years ago  Substance Use Topics  . Alcohol use: Yes    Comment: glass of wine daily  . Drug use: No    Home Medications Prior to Admission medications   Medication Sig Start Date End Date Taking? Authorizing Provider  acetaminophen (TYLENOL) 325 MG tablet Take 650 mg by mouth every 6 (six) hours as needed.   Yes [provider]  Acetylcysteine 600 MG CAPS Take 1 capsule (600 mg total) by mouth 2 (two) times daily. 12/30/18  Yes Olalere, Adewale A, MD  pantoprazole (PROTONIX) 40 MG tablet Take 40 mg by mouth 2 (two) times daily.  09/06/18  Yes [provider]  predniSONE (DELTASONE) 5 MG tablet TAKE ONE TABLET BY MOUTH DAILY WITH BREAKFAST Patient not taking: Reported on 01/22/2019 12/11/18   Laurin Coder, MD    Allergies    Benadryl [diphenhydramine hcl], Codeine, Coumadin [warfarin sodium], and Demerol [meperidine]  Review of Systems   Review of Systems  Constitutional: Negative for chills and fever.  Gastrointestinal: Positive for constipation. Negative for abdominal pain, nausea and vomiting.  Genitourinary: Negative for difficulty urinating.  Musculoskeletal: Positive for arthralgias. Negative for back pain.  Neurological: Negative for syncope and headaches.  All other systems reviewed and are negative.   Physical Exam Updated Vital Signs BP 116/69 (BP Location: Left Arm)   Pulse 83   Temp 98 F (36.7 C) (Oral)   Resp 17   SpO2 97%   Physical Exam Vitals and nursing note reviewed.  Constitutional:      Appearance: She is not ill-appearing or diaphoretic.  HENT:     Head: Normocephalic and atraumatic.  Eyes:     Conjunctiva/sclera: Conjunctivae normal.  Cardiovascular:     Rate and Rhythm: Normal rate and regular rhythm.     Pulses: Normal pulses.  Pulmonary:     Effort: Pulmonary effort is normal.     Breath sounds: Normal breath sounds. No wheezing, rhonchi or rales.  Chest:     Chest  wall: No tenderness.  Abdominal:     Palpations: Abdomen is soft.     Tenderness: There is no abdominal tenderness. There is no guarding or rebound.  Musculoskeletal:     Cervical back: Neck supple.       Back:     Comments: No obvious deformity noted to right hip. No external rotation or leg shortening. + tenderness to posterior aspect along PSIS. No tenderness with log roll of leg or flexion of hip. Able to lift leg without difficulty. Strength 5/5. Sensation intact throughout. 2+ DP and PT pulses.  No C, T, or L midline spinal tenderness.   Skin:    General: Skin is warm and dry.  Neurological:  Mental Status: She is alert.     ED Results / Procedures / Treatments   Labs (all labs ordered are listed, but only abnormal results are displayed) Labs Reviewed  BASIC METABOLIC PANEL - Abnormal; Notable for the following components:      Result Value   Calcium 8.7 (*)    All other components within normal limits  CBC WITH DIFFERENTIAL/PLATELET - Abnormal; Notable for the following components:   WBC 12.1 (*)    RBC 3.59 (*)    Hemoglobin 11.7 (*)    HCT 35.8 (*)    All other components within normal limits  SARS CORONAVIRUS 2 (TAT 6-24 HRS)  MAGNESIUM  TSH  TROPONIN I (HIGH SENSITIVITY)  TROPONIN I (HIGH SENSITIVITY)    EKG None  Radiology CT LUMBAR SPINE WO CONTRAST  Result Date: 01/22/2019 CLINICAL DATA:  Initial evaluation for acute trauma, fall. EXAM: CT LUMBAR SPINE WITHOUT CONTRAST TECHNIQUE: Multidetector CT imaging of the lumbar spine was performed without intravenous contrast administration. Multiplanar CT image reconstructions were also generated. COMPARISON:  None available. FINDINGS: Segmentation: Standard. Lowest well-formed disc space labeled the L5-S1 level. Alignment: 5 mm anterolisthesis of L4 on L5, chronic and facet mediated. Alignment otherwise normal with preservation of the normal lumbar lordosis. Vertebrae: There is an acute compression fracture  involving the superior endplate of L2 with up to 40% height loss and trace 2 mm bony retropulsion. No significant stenosis. This is benign/mechanical in appearance. Otherwise, vertebral body height maintained. No other acute or chronic fracture. Visualized sacrum and pelvis intact. No discrete or worrisome osseous lesions. Paraspinal and other soft tissues: Minimal paraspinal edema adjacent to the L2 compression fracture. Paraspinous soft tissues demonstrate no other acute finding. Extensive aorto bi-iliac atherosclerotic disease noted. Colonic diverticulosis noted. Disc levels: L1-2: Trace 2 mm bony retropulsion related to the L2 compression fracture. Moderate bilateral facet hypertrophy. No significant stenosis. L2-3: Mild disc bulge. Moderate bilateral facet hypertrophy. No significant spinal stenosis. Foramina appear patent. L3-4: Mild annular disc bulge. Moderate facet and ligament flavum hypertrophy. No significant spinal stenosis. Foramina appear patent. L4-5: Chronic 5 mm anterolisthesis. Associated broad posterior pseudo disc bulge/uncovering, asymmetric to the right. Severe bilateral facet degeneration. Resultant mild-to-moderate canal and bilateral lateral recess stenosis. Mild bilateral L4 foraminal narrowing. L5-S1: Negative interspace. Advanced bilateral facet degeneration. No significant spinal stenosis. Mild left L5 foraminal narrowing. IMPRESSION: 1. Acute compression fracture involving the superior endplate of L2 with up to 40% height loss and trace 2 mm bony retropulsion. No significant stenosis. 2. No other acute traumatic injury within the lumbar spine. 3. 5 mm anterolisthesis of L4 on L5, chronic and facet mediated. Advanced bilateral facet degeneration and mild disc bulging at this level resultant mild-to-moderate canal and bilateral lateral recess stenosis. 4. Colonic diverticulosis. 5.  Aortic Atherosclerosis (ICD10-I70.0). Electronically Signed   By: Jeannine Boga M.D.   On:  01/22/2019 20:31   CT PELVIS WO CONTRAST  Result Date: 01/22/2019 CLINICAL DATA:  Low back and right hip pain after fall on Saturday. EXAM: CT PELVIS WITHOUT CONTRAST TECHNIQUE: Multidetector CT imaging of the pelvis was performed following the standard protocol without intravenous contrast. COMPARISON:  Right hip x-rays from same day. FINDINGS: Urinary Tract:  Left posterolateral bladder diverticulum. Bowel:  Sigmoid diverticulosis. Vascular/Lymphatic: Aortoiliac atherosclerotic vascular disease. No enlarged pelvic lymph nodes. Reproductive:  Prior hysterectomy.  No adnexal mass. Other:  None. Musculoskeletal: No acute fracture or dislocation. Prior bilateral total hip arthroplasties. No hardware complication. Severe facet arthropathy at L4-L5 and  L5-S1 with grade 1 anterolisthesis at L4-L5. 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 IMPRESSION: 1. No acute osseous abnormality. 2. Prior bilateral total hip arthroplasties without hardware complication. Electronically Signed   By: Titus Dubin M.D.   On: 01/22/2019 20:28   DG Abd Acute W/Chest  Result Date: 01/22/2019 CLINICAL DATA:  Constipation EXAM: DG ABDOMEN ACUTE W/ 1V CHEST COMPARISON:  01/22/2019 FINDINGS: Nonobstructive bowel gas pattern. Moderate stool throughout the colon. No organomegaly or free air. No suspicious calcification. Bilateral hip replacements. No acute bony abnormality. IMPRESSION: Moderate stool burden.  No acute findings. Electronically Signed   By: Rolm Baptise M.D.   On: 01/22/2019 18:08   DG Hip Unilat With Pelvis 2-3 Views Right  Result Date: 01/22/2019 CLINICAL DATA:  Right-sided hip pain EXAM: DG HIP (WITH OR WITHOUT PELVIS) 2-3V RIGHT COMPARISON:  12/23/2014 FINDINGS: SI joints are non widened. Pubic symphysis is intact. Status post bilateral hip replacements with normal alignment. Multiple calcifications about the right hip are chronic. IMPRESSION: Status post bilateral hip replacements. No  definite acute osseous abnormality. Electronically Signed   By: Donavan Foil M.D.   On: 01/22/2019 17:58    Procedures Procedures (including critical care time)  Medications Ordered in ED Medications  magnesium sulfate IVPB 1 g 100 mL (has no administration in time range)  diltiazem (CARDIZEM) 125 mg in dextrose 5% 125 mL (1 mg/mL) infusion (5 mg/hr Intravenous Rate/Dose Verify 01/22/19 2319)  pantoprazole (PROTONIX) EC tablet 40 mg (has no administration in time range)  Acetylcysteine CAPS 600 mg (has no administration in time range)  acetaminophen (TYLENOL) tablet 650 mg (has no administration in time range)  ondansetron (ZOFRAN) injection 4 mg (has no administration in time range)  Rivaroxaban (XARELTO) tablet 15 mg (has no administration in time range)  HYDROcodone-acetaminophen (NORCO/VICODIN) 5-325 MG per tablet 1 tablet (has no administration in time range)  HYDROmorphone (DILAUDID) injection 0.5 mg (has no administration in time range)  0.9 %  sodium chloride infusion (has no administration in time range)  sodium chloride 0.9 % bolus 500 mL (has no administration in time range)  polyethylene glycol (MIRALAX / GLYCOLAX) packet 17 g (has no administration in time range)  bisacodyl (DULCOLAX) EC tablet 5 mg (has no administration in time range)  HYDROmorphone (DILAUDID) injection 0.5 mg (0.5 mg Intravenous Given 01/22/19 2137)    ED Course  I have reviewed the triage vital signs and the nursing notes.  Pertinent labs & imaging results that were available during my care of the patient were reviewed by me and considered in my medical decision making (see chart for details).  86 year old female who presents to the ED today with fall that occurred 5 days ago, currently complaining of right hip pain. Pt has been ambulating on hip but reports pain after prolong standing. She has pain to the PSIS hip; hx of hip replacement. Will obtain xray at this time. Pt also reports constipation for the  past 5 days after her fall; has tried OTC meds without relief. Has not tried miralax. Still passing gas; less concerned about SBO but will obtain xray at this time.   Xray of right hip without fracture. Moderate stool burden on acute abdominal series; given pt is not having any abdominal pain from this do not feel she needs an enema in the ED today. Will discharge home with miralax. Will obtain CT pelvis at this time to further evaluate for small fracture of the pelvis.   CT tech called  and informed while moving patient she began complaining of pain to her lower back; will add on L spine CT   CT pelvis without acute findings. CT L spine with acute compression fracture of L2.   Nursing staff informed that when pt returned from [place] her heart rate was noted to be in the 140's; EKG obtained and pt in A fib - no history of prior. Pt does have cardiologist with known mitral valve regurg/mild to moderate aortic valve regurg. Will order diltiazem drip at this time. Pt will need to be admitted for further workup given new onset A fib and new L2 compression fracture; given her age I feel she will benefit PT/OT eval while in the hospital. Will consult cardiology after obtaining screening labs. Pt without any chest pain or SOB.   Clinical Course as of Jan 22 2324  Wed Jan 22, 2019  2228 Discussed case with Dr. Marletta Lor with cardiology who will follow and evaluate patient in the morning   [MV]  2251 Discussed case with Dr. Myna Hidalgo who agrees to accept patient for admission   [MV]    Clinical Course User Index [MV] Eustaquio Maize, PA-C   MDM Rules/Calculators/A&P                       Final Clinical Impression(s) / ED Diagnoses Final diagnoses:  Atrial fibrillation with RVR ([place])  Fall, initial encounter  Closed wedge compression fracture of L2 vertebra, initial encounter [facility])    Rx / DC Orders ED Discharge Orders         Ordered    Amb referral to [facility]     01/22/19 2108             Eustaquio Maize, PA-C 01/22/19 2326    Carmin Muskrat, MD 01/22/19 2336

## 2019-01-22 NOTE — ED Notes (Signed)
Patient transported to X-ray 

## 2019-01-22 NOTE — H&P (Addendum)
History and Physical    EBBONY Bullock [id number] DOB: 1928/10/01 DOA: 01/22/2019  PCP: Harlan Stains, MD   Patient coming from: Home   Chief Complaint: Fall with right hip pain   HPI: Virginia Bullock is a 86 y.o. female with medical history significant for pulmonary fibrosis, history of hypothyroidism, and history of bilateral hip replacements, now presenting to the emergency department with severe right hip pain after a mechanical fall several days ago.  Patient reports that she had been in her usual state of health when she had a ground-level mechanical fall at home on 01/18/2019, landing on her right hip.  She has been able to bear weight since then, but has had severe pain at the posterior aspect of her right hip/buttock region.  She denies any history of palpitations, reports occasional fleeting episodes of chest discomfort though that occur less than monthly and usually while she is lying in bed.  She follows with pulmonology for her pulmonary fibrosis, had been on low-dose prednisone, but has stopped this 1 week ago.  She denies any recent worsening in her shortness of breath and denies any fevers or chills.  She denies any history of bleeding problems but reports developing a rash when she was started on Coumadin after her hip replacements.  ED Course: Upon arrival to the ED, patient is found to be afebrile, saturating mid 90s on room air, slightly tachypneic, tachycardic to 149, and with stable blood pressure.  EKG features atrial fibrillation with RVR, rate 138.  KUB and chest x-ray with moderate stool burden but otherwise no acute findings.  Radiographs of the hips without any definite acute osseous abnormality and CT of the pelvis is also negative.  CT lumbar spine concerning for acute compression fracture involving the superior endplate of L2 with trace bony retropulsion but without significant stenosis.  Chemistry panel is unremarkable and CBC notable for mild normocytic anemia and  leukocytosis.  High-sensitivity troponin was normal.  Cardiology was consulted by the ED physician, indicated that they would see the patient in the morning, and recommended medical admission.  Patient was started on diltiazem infusion, given IV Dilaudid, and hospitalist consulted for admission.  Review of Systems:  All other systems reviewed and apart from HPI, are negative.  Past Medical History:  Diagnosis Date  . Arthritis    right hip  . Basal cell carcinoma of skin   . Complication of anesthesia    "epidural with 5th baby-stopped breathing and went numb, 'died'"  . Diverticulosis of colon without hemorrhage 02/27/2014  . Goiter   . History of herpes zoster 02/27/2014  . History of shingles    x2  . Hypothyroidism   . Impaired hearing   . Loss of hearing 02/27/2014   both  . Migraine headache 02/27/2014   migraines, aura without pain  . OA (osteoarthritis) of hip 02/20/2014  . Osteopenia 02/27/2014  . Post-nasal drip     Past Surgical History:  Procedure Laterality Date  . ABDOMINAL HYSTERECTOMY  2011   partial  . BASAL CELL CARCINOMA EXCISION    . CERVICAL SPINE SURGERY  24 years ago   2 ruptured disc  . EYE SURGERY Bilateral    lens replacements for cataract  . INNER EAR SURGERY Bilateral as child   double mastoid  . KNEE ARTHROSCOPY Left 30 years ago   "born with knee cap problem"  . MASTOIDECTOMY Bilateral    as a baby  . NASAL SEPTUM SURGERY    .  TONSILLECTOMY  age 76 and 34   x2  . TOTAL HIP ARTHROPLASTY Right 02/20/2014   Procedure: RIGHT TOTAL HIP ARTHROPLASTY ANTERIOR APPROACH;  Surgeon: Gearlean Alf, MD;  Location: [facility];  Service: Orthopedics;  Laterality: Right;  . TOTAL HIP ARTHROPLASTY Left 12/23/2014   Procedure: LEFT TOTAL HIP ARTHROPLASTY ANTERIOR APPROACH;  Surgeon: Gaynelle Arabian, MD;  Location: [facility];  Service: Orthopedics;  Laterality: Left;     reports that she has quit smoking. Her smoking use included cigarettes. She has never used  smokeless tobacco. She reports current alcohol use. She reports that she does not use drugs.  Allergies  Allergen Reactions  . Benadryl [Diphenhydramine Hcl] Anxiety    Panic attack  . Codeine Nausea And Vomiting  . Coumadin [Warfarin Sodium]     Severe rash  . Demerol [Meperidine] Nausea And Vomiting    Family History  Problem Relation Age of Onset  . Cancer Maternal Grandmother   . Tuberculosis Maternal Grandfather      Prior to Admission medications   Medication Sig Start Date End Date Taking? Authorizing Provider  acetaminophen (TYLENOL) 325 MG tablet Take 650 mg by mouth every 6 (six) hours as needed.   Yes [provider]  Acetylcysteine 600 MG CAPS Take 1 capsule (600 mg total) by mouth 2 (two) times daily. 12/30/18  Yes Olalere, Adewale A, MD  pantoprazole (PROTONIX) 40 MG tablet Take 40 mg by mouth 2 (two) times daily.  09/06/18  Yes [provider]  predniSONE (DELTASONE) 5 MG tablet TAKE ONE TABLET BY MOUTH DAILY WITH BREAKFAST Patient not taking: Reported on 01/22/2019 12/11/18   Laurin Coder, MD    Physical Exam: Vitals:   01/22/19 2039 01/22/19 2139 01/22/19 2230 01/22/19 2300  BP: 123/71 116/85 (!) 106/54 106/74  Pulse: (!) 145 (!) 149 (!) 144 (!) 133  Resp: 18 (!) 24 (!) 21 19  Temp:      TempSrc:      SpO2: 96% 94% 93% 91%    Constitutional: NAD, calm  Eyes: PERTLA, lids and conjunctivae normal ENMT: Mucous membranes are moist. Posterior pharynx clear of any exudate or lesions.   Neck: normal, supple, no masses, no thyromegaly Respiratory: dyspnea with speech, no wheezing, no crackles. No accessory muscle use.  Cardiovascular: Rate ~120 and irregularly irregular. No extremity edema.   Abdomen: No distension, no tenderness, soft. Bowel sounds active.  Musculoskeletal: no clubbing / cyanosis. Tender at right hip posteriorly.  Skin: no significant rashes, lesions, ulcers. Warm, dry, well-perfused. Neurologic: No facial asymmetry.  Sensation intact. Moving all extremities.  Psychiatric: Alert and appropriate throughout interview and exam. Very pleasant.    Labs on Admission: I have personally reviewed following labs and imaging studies  CBC: Recent Labs  Lab 01/22/19 2115  WBC 12.1*  NEUTROABS 7.5  HGB 11.7*  HCT 35.8*  MCV 99.7  PLT [id number]   Basic Metabolic Panel: Recent Labs  Lab 01/22/19 2115  NA 139  K 4.0  CL 108  CO2 23  GLUCOSE 86  BUN 20  CREATININE 0.58  CALCIUM 8.7*  MG 1.8   GFR: CrCl cannot be calculated (Unknown ideal weight.). Liver Function Tests: No results for input(s): AST, ALT, ALKPHOS, BILITOT, PROT, ALBUMIN in the last 168 hours. No results for input(s): LIPASE, AMYLASE in the last 168 hours. No results for input(s): AMMONIA in the last 168 hours. Coagulation Profile: No results for input(s): INR, PROTIME in the last 168 hours. Cardiac Enzymes: No results for  input(s): CKTOTAL, CKMB, CKMBINDEX, TROPONINI in the last 168 hours. BNP (last 3 results) No results for input(s): PROBNP in the last 8760 hours. HbA1C: No results for input(s): HGBA1C in the last 72 hours. CBG: No results for input(s): GLUCAP in the last 168 hours. Lipid Profile: No results for input(s): CHOL, HDL, LDLCALC, TRIG, CHOLHDL, LDLDIRECT in the last 72 hours. Thyroid Function Tests: No results for input(s): TSH, T4TOTAL, FREET4, T3FREE, THYROIDAB in the last 72 hours. Anemia Panel: No results for input(s): VITAMINB12, FOLATE, FERRITIN, TIBC, IRON, RETICCTPCT in the last 72 hours. Urine analysis:    Component Value Date/Time   COLORURINE YELLOW 12/17/2014 1420   APPEARANCEUR CLEAR 12/17/2014 1420   LABSPEC 1.010 12/17/2014 1420   PHURINE 7.0 12/17/2014 1420   GLUCOSEU NEGATIVE 12/17/2014 1420   HGBUR NEGATIVE 12/17/2014 1420   BILIRUBINUR NEGATIVE 12/17/2014 1420   KETONESUR NEGATIVE 12/17/2014 1420   PROTEINUR NEGATIVE 12/17/2014 1420   UROBILINOGEN 0.2 02/16/2014 1412   NITRITE NEGATIVE  12/17/2014 1420   LEUKOCYTESUR NEGATIVE 12/17/2014 1420   Sepsis Labs: @LABRCNTIP (procalcitonin:4,lacticidven:4) )No results found for this or any previous visit (from the past 240 hour(s)).   Radiological Exams on Admission: CT LUMBAR SPINE WO CONTRAST  Result Date: 01/22/2019 CLINICAL DATA:  Initial evaluation for acute trauma, fall. EXAM: CT LUMBAR SPINE WITHOUT CONTRAST TECHNIQUE: Multidetector CT imaging of the lumbar spine was performed without intravenous contrast administration. Multiplanar CT image reconstructions were also generated. COMPARISON:  None available. FINDINGS: Segmentation: Standard. Lowest well-formed disc space labeled the L5-S1 level. Alignment: 5 mm anterolisthesis of L4 on L5, chronic and facet mediated. Alignment otherwise normal with preservation of the normal lumbar lordosis. Vertebrae: There is an acute compression fracture involving the superior endplate of L2 with up to 40% height loss and trace 2 mm bony retropulsion. No significant stenosis. This is benign/mechanical in appearance. Otherwise, vertebral body height maintained. No other acute or chronic fracture. Visualized sacrum and pelvis intact. No discrete or worrisome osseous lesions. Paraspinal and other soft tissues: Minimal paraspinal edema adjacent to the L2 compression fracture. Paraspinous soft tissues demonstrate no other acute finding. Extensive aorto bi-iliac atherosclerotic disease noted. Colonic diverticulosis noted. Disc levels: L1-2: Trace 2 mm bony retropulsion related to the L2 compression fracture. Moderate bilateral facet hypertrophy. No significant stenosis. L2-3: Mild disc bulge. Moderate bilateral facet hypertrophy. No significant spinal stenosis. Foramina appear patent. L3-4: Mild annular disc bulge. Moderate facet and ligament flavum hypertrophy. No significant spinal stenosis. Foramina appear patent. L4-5: Chronic 5 mm anterolisthesis. Associated broad posterior pseudo disc bulge/uncovering,  asymmetric to the right. Severe bilateral facet degeneration. Resultant mild-to-moderate canal and bilateral lateral recess stenosis. Mild bilateral L4 foraminal narrowing. L5-S1: Negative interspace. Advanced bilateral facet degeneration. No significant spinal stenosis. Mild left L5 foraminal narrowing. IMPRESSION: 1. Acute compression fracture involving the superior endplate of L2 with up to 40% height loss and trace 2 mm bony retropulsion. No significant stenosis. 2. No other acute traumatic injury within the lumbar spine. 3. 5 mm anterolisthesis of L4 on L5, chronic and facet mediated. Advanced bilateral facet degeneration and mild disc bulging at this level resultant mild-to-moderate canal and bilateral lateral recess stenosis. 4. Colonic diverticulosis. 5.  Aortic Atherosclerosis (ICD10-I70.0). Electronically Signed   By: Jeannine Boga M.D.   On: 01/22/2019 20:31   CT PELVIS WO CONTRAST  Result Date: 01/22/2019 CLINICAL DATA:  Low back and right hip pain after fall on Saturday. EXAM: CT PELVIS WITHOUT CONTRAST TECHNIQUE: Multidetector CT imaging of the pelvis was performed  following the standard protocol without intravenous contrast. COMPARISON:  Right hip x-rays from same day. FINDINGS: Urinary Tract:  Left posterolateral bladder diverticulum. Bowel:  Sigmoid diverticulosis. Vascular/Lymphatic: Aortoiliac atherosclerotic vascular disease. No enlarged pelvic lymph nodes. Reproductive:  Prior hysterectomy.  No adnexal mass. Other:  None. Musculoskeletal: No acute fracture or dislocation. Prior bilateral total hip arthroplasties. No hardware complication. Severe facet arthropathy at L4-L5 and L5-S1 with grade 1 anterolisthesis at L4-L5. 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 IMPRESSION: 1. No acute osseous abnormality. 2. Prior bilateral total hip arthroplasties without hardware complication. Electronically Signed   By: Titus Dubin M.D.   On: 01/22/2019 20:28   DG Abd  Acute W/Chest  Result Date: 01/22/2019 CLINICAL DATA:  Constipation EXAM: DG ABDOMEN ACUTE W/ 1V CHEST COMPARISON:  01/22/2019 FINDINGS: Nonobstructive bowel gas pattern. Moderate stool throughout the colon. No organomegaly or free air. No suspicious calcification. Bilateral hip replacements. No acute bony abnormality. IMPRESSION: Moderate stool burden.  No acute findings. Electronically Signed   By: Rolm Baptise M.D.   On: 01/22/2019 18:08   DG Hip Unilat With Pelvis 2-3 Views Right  Result Date: 01/22/2019 CLINICAL DATA:  Right-sided hip pain EXAM: DG HIP (WITH OR WITHOUT PELVIS) 2-3V RIGHT COMPARISON:  12/23/2014 FINDINGS: SI joints are non widened. Pubic symphysis is intact. Status post bilateral hip replacements with normal alignment. Multiple calcifications about the right hip are chronic. IMPRESSION: Status post bilateral hip replacements. No definite acute osseous abnormality. Electronically Signed   By: Donavan Foil M.D.   On: 01/22/2019 17:58    EKG: Independently reviewed. Atrial fibrillation with RVR, rate 138.   Assessment/Plan   1. New-onset atrial fibrillation with RVR  - Presents with right hip pain since a fall on 01/18/19, noted to have HR in 140's and EKG confirms atrial fibrillation with RVR  - No anginal complaints to suggest an ischemic etiology and HS troponin was normal  - She had echo in June 2020 with EF 55-60%, mild LAE, moderate MR, mild-moderate TR, and mild AI  - CHADS-VASc at least 35 (age x2, gender) - She was started on diltiazem infusion in ED  - Continue cardiac monitoring, titrate diltiazem infusion for goal HR 65-110, check TSH, start DOAC    2. L2 compression fracture   - Presents with right hip pain after a fall on 01/18/19, has no acute osseous abnormality on CT pelvis, but is noted to have acute-appearing L2 compression fracture  - Continue pain-control and supportive care    3. Pulmonary fibrosis  - Stable, stopped low-dose prednisone one week ago      DVT prophylaxis: Xarelto   Code Status: DNR, confirmed with patient  Family Communication: Discussed with patient  Consults called: Cardiology consulted by ED physician  Admission status: observation     Vianne Bulls, MD [facility] Pager [phone]  If 7PM-7AM, please contact night-coverage [email or web address] Password [email or web address]  01/22/2019, 11:10 PM

## 2019-01-22 NOTE — ED Triage Notes (Signed)
Per [facility] pt from home for fall on Saturday where she landed on her right hip. Lives alone and reports that having pains with weight bearing and gait. Doesn't normally use gait device for assistance. Pt has labored breathing that is normal due to pulmonary fibrosis.   86HR irreg.

## 2019-01-22 NOTE — ED Notes (Signed)
Patient placed on cardiac monitoring at this time due to elevated heart rate. EDPA made aware of heart rate in 140s and rhythm of afib. Will wait for further orders. Patient denies chest pain or shortness of breath currently.

## 2019-01-23 DIAGNOSIS — Z8619 Personal history of other infectious and parasitic diseases: Secondary | ICD-10-CM | POA: Diagnosis not present

## 2019-01-23 DIAGNOSIS — I4891 Unspecified atrial fibrillation: Secondary | ICD-10-CM | POA: Diagnosis present

## 2019-01-23 DIAGNOSIS — Z888 Allergy status to other drugs, medicaments and biological substances status: Secondary | ICD-10-CM | POA: Diagnosis not present

## 2019-01-23 DIAGNOSIS — Z66 Do not resuscitate: Secondary | ICD-10-CM | POA: Diagnosis present

## 2019-01-23 DIAGNOSIS — Z96643 Presence of artificial hip joint, bilateral: Secondary | ICD-10-CM | POA: Diagnosis present

## 2019-01-23 DIAGNOSIS — H9190 Unspecified hearing loss, unspecified ear: Secondary | ICD-10-CM | POA: Diagnosis present

## 2019-01-23 DIAGNOSIS — W19XXXA Unspecified fall, initial encounter: Secondary | ICD-10-CM

## 2019-01-23 DIAGNOSIS — I080 Rheumatic disorders of both mitral and aortic valves: Secondary | ICD-10-CM | POA: Diagnosis present

## 2019-01-23 DIAGNOSIS — Z885 Allergy status to narcotic agent status: Secondary | ICD-10-CM | POA: Diagnosis not present

## 2019-01-23 DIAGNOSIS — K5900 Constipation, unspecified: Secondary | ICD-10-CM | POA: Diagnosis present

## 2019-01-23 DIAGNOSIS — Z87891 Personal history of nicotine dependence: Secondary | ICD-10-CM | POA: Diagnosis not present

## 2019-01-23 DIAGNOSIS — J8410 Pulmonary fibrosis, unspecified: Secondary | ICD-10-CM | POA: Diagnosis present

## 2019-01-23 DIAGNOSIS — S32020A Wedge compression fracture of second lumbar vertebra, initial encounter for closed fracture: Secondary | ICD-10-CM | POA: Diagnosis present

## 2019-01-23 DIAGNOSIS — Z79899 Other long term (current) drug therapy: Secondary | ICD-10-CM | POA: Diagnosis not present

## 2019-01-23 DIAGNOSIS — Z85828 Personal history of other malignant neoplasm of skin: Secondary | ICD-10-CM | POA: Diagnosis not present

## 2019-01-23 DIAGNOSIS — W010XXA Fall on same level from slipping, tripping and stumbling without subsequent striking against object, initial encounter: Secondary | ICD-10-CM | POA: Diagnosis present

## 2019-01-23 DIAGNOSIS — Z20822 Contact with and (suspected) exposure to covid-19: Secondary | ICD-10-CM | POA: Diagnosis present

## 2019-01-23 DIAGNOSIS — K219 Gastro-esophageal reflux disease without esophagitis: Secondary | ICD-10-CM | POA: Diagnosis present

## 2019-01-23 DIAGNOSIS — E039 Hypothyroidism, unspecified: Secondary | ICD-10-CM | POA: Diagnosis present

## 2019-01-23 DIAGNOSIS — K921 Melena: Secondary | ICD-10-CM | POA: Diagnosis not present

## 2019-01-23 DIAGNOSIS — D62 Acute posthemorrhagic anemia: Secondary | ICD-10-CM | POA: Diagnosis not present

## 2019-01-23 LAB — COMPREHENSIVE METABOLIC PANEL
ALT: 14 U/L (ref 0–44)
AST: 20 U/L (ref 15–41)
Albumin: 3.2 g/dL — ABNORMAL LOW (ref 3.5–5.0)
Alkaline Phosphatase: 51 U/L (ref 38–126)
Anion gap: 7 (ref 5–15)
BUN: 18 mg/dL (ref 8–23)
CO2: 23 mmol/L (ref 22–32)
Calcium: 8.1 mg/dL — ABNORMAL LOW (ref 8.9–10.3)
Chloride: 106 mmol/L (ref 98–111)
Creatinine, Ser: 0.52 mg/dL (ref 0.44–1.00)
GFR calc Af Amer: >60 mL/min (ref >60)
GFR calc non Af Amer: >60 mL/min (ref >60)
Glucose, Bld: 102 mg/dL — ABNORMAL HIGH (ref 70–99)
Potassium: 3.9 mmol/L (ref 3.5–5.1)
Sodium: 136 mmol/L (ref 135–145)
Total Bilirubin: 0.9 mg/dL (ref 0.3–1.2)
Total Protein: 6 g/dL — ABNORMAL LOW (ref 6.5–8.1)

## 2019-01-23 LAB — CBC
HCT: 34.5 % — ABNORMAL LOW (ref 36.0–46.0)
Hemoglobin: 11.1 g/dL — ABNORMAL LOW (ref 12.0–15.0)
MCH: 33 pg (ref 26.0–34.0)
MCHC: 32.2 g/dL (ref 30.0–36.0)
MCV: 102.7 fL — ABNORMAL HIGH (ref 80.0–100.0)
Platelets: 192 10*3/uL (ref 150–400)
RBC: 3.36 MIL/uL — ABNORMAL LOW (ref 3.87–5.11)
RDW: 12.8 % (ref 11.5–15.5)
WBC: 11.4 10*3/uL — ABNORMAL HIGH (ref 4.0–10.5)
nRBC: 0 % (ref 0.0–0.2)

## 2019-01-23 LAB — TROPONIN I (HIGH SENSITIVITY): Troponin I (High Sensitivity): 14 ng/L (ref <18)

## 2019-01-23 LAB — MRSA PCR SCREENING: MRSA by PCR: NEGATIVE

## 2019-01-23 LAB — SARS CORONAVIRUS 2 (TAT 6-24 HRS): SARS Coronavirus 2: NEGATIVE

## 2019-01-23 LAB — MAGNESIUM: Magnesium: 1.9 mg/dL (ref 1.7–2.4)

## 2019-01-23 MED ORDER — SODIUM CHLORIDE 0.9 % IV SOLN: 250.0000 mL | INTRAVENOUS | Status: DC | PRN

## 2019-01-23 MED ORDER — SODIUM CHLORIDE 0.9% FLUSH: 3.0000 mL | INTRAVENOUS | Status: DC | PRN

## 2019-01-23 MED ORDER — CHLORHEXIDINE GLUCONATE CLOTH 2 % EX PADS
6.0000 | MEDICATED_PAD | Freq: Every day | CUTANEOUS | Status: DC
  Administered 2019-01-24 – 2019-01-25 (×2): 6 via TOPICAL

## 2019-01-23 MED ORDER — SODIUM CHLORIDE 0.9% FLUSH
3.0000 mL | Freq: Two times a day (BID) | INTRAVENOUS | Status: DC
  Administered 2019-01-23 – 2019-01-29 (×14): 3 mL via INTRAVENOUS

## 2019-01-23 NOTE — Progress Notes (Signed)
PROGRESS NOTE    Virginia Bullock  [id number] DOB: [birth year] DOA: 01/22/2019 PCP: Harlan Stains, MD    Brief Narrative:  Per Dr. Myna Hidalgo: 86 y.o. female with medical history significant for pulmonary fibrosis, history of hypothyroidism, and history of bilateral hip replacements, now presenting to the emergency department with severe right hip pain after a mechanical fall several days ago.  Patient reports that she had been in her usual state of health when she had a ground-level mechanical fall at home on 01/18/2019, landing on her right hip.  She has been able to bear weight since then, but has had severe pain at the posterior aspect of her right hip/buttock region.  She denies any history of palpitations, reports occasional fleeting episodes of chest discomfort though that occur less than monthly and usually while she is lying in bed.  She follows with pulmonology for her pulmonary fibrosis, had been on low-dose prednisone, but has stopped this 1 week ago.  She denies any recent worsening in her shortness of breath and denies any fevers or chills.  She denies any history of bleeding problems but reports developing a rash when she was started on Coumadin after her hip replacements.  ED Course: Upon arrival to the ED, patient is found to be afebrile, saturating mid 90s on room air, slightly tachypneic, tachycardic to 149, and with stable blood pressure.  EKG features atrial fibrillation with RVR, rate 138.  KUB and chest x-ray with moderate stool burden but otherwise no acute findings.  Radiographs of the hips without any definite acute osseous abnormality and CT of the pelvis is also negative.  CT lumbar spine concerning for acute compression fracture involving the superior endplate of L2 with trace bony retropulsion but without significant stenosis.  Chemistry panel is unremarkable and CBC notable for mild normocytic anemia and leukocytosis.  High-sensitivity troponin was normal.  Cardiology was  consulted by the ED physician, indicated that they would see the patient in the morning, and recommended medical admission.  Patient was started on diltiazem infusion, given IV Dilaudid, and hospitalist consulted for admission.  Assessment & Plan:   Principal Problem:   Atrial fibrillation with RVR (HCC) Active Problems:   Closed compression fracture of L2 lumbar vertebra, initial encounter ([place])   Pulmonary fibrosis ([place])   1. New-onset atrial fibrillation with RVR  - Presents with right hip pain since a fall on 01/18/19, noted to have HR in 140's and EKG confirms atrial fibrillation with RVR  - No anginal complaints to suggest an ischemic etiology and HS troponin was normal  - She had echo in June 2020 with EF 55-60%, mild LAE, moderate MR, mild-moderate TR, and mild AI  - CHADS-VASc at least 3 noted - She was started on diltiazem infusion in ED  - Now rate controlled on Cardizem drip. -Cardiology following.  Recommendation to continue Cardizem drip through today, possible transition to oral Cardizem tomorrow if patient remains in sinus rhythm   2. L2 compression fracture   - Presents with right hip pain after a fall on 01/18/19, has no acute osseous abnormality on CT pelvis, but is noted to have acute-appearing L2 compression fracture  - Continue with analgesics as needed -Physical therapy consulted  3. Pulmonary fibrosis  - Stable, stopped low-dose prednisone one week ago     DVT prophylaxis: Xarelto Code Status: DNR Family Communication: Patient in room, family not at bedside Disposition Plan: Uncertain at this time  Consultants:   Cardiology  Procedures:  Antimicrobials: Anti-infectives (From admission, onward)   None       Subjective: Without complaints this morning  Objective: Vitals:   01/23/19 0901 01/23/19 1200 01/23/19 1208 01/23/19 1400  BP: 118/65  (!) 94/57 (!) 91/47  Pulse: 97 87 (!) 102 93  Resp: (!) 21 (!) 24 (!) 21 (!) 33  Temp:   98.2  F (36.8 C)   TempSrc:   Oral   SpO2: 95% 93% 94% 94%  Weight:      Height:        Intake/Output Summary (Last 24 hours) at 01/23/2019 1537 Last data filed at 01/23/2019 1500 Gross per 24 hour  Intake 1165.57 ml  Output --  Net 1165.57 ml   Filed Weights   01/22/19 2300 01/23/19 0237  Weight: 56.7 kg 57.1 kg    Examination:  General exam: Appears calm and comfortable  Respiratory system: Clear to auscultation. Respiratory effort normal. Cardiovascular system: S1 & S2 heard, regular Gastrointestinal system: Abdomen is nondistended, soft and nontender. No organomegaly or masses felt. Normal bowel sounds heard. Central nervous system: Alert and oriented. No focal neurological deficits. Extremities: Symmetric 5 x 5 power. Skin: No rashes, lesions  Psychiatry: Judgement and insight appear normal. Mood & affect appropriate.   Data Reviewed: I have personally reviewed following labs and imaging studies  CBC: Recent Labs  Lab 01/22/19 2115 01/23/19 0521  WBC 12.1* 11.4*  NEUTROABS 7.5  --   HGB 11.7* 11.1*  HCT 35.8* 34.5*  MCV 99.7 102.7*  PLT 209 [id number]   Basic Metabolic Panel: Recent Labs  Lab 01/22/19 2115 01/23/19 0521  NA 139 136  K 4.0 3.9  CL 108 106  CO2 23 23  GLUCOSE 86 102*  BUN 20 18  CREATININE 0.58 0.52  CALCIUM 8.7* 8.1*  MG 1.8 1.9   GFR: Estimated Creatinine Clearance: 38.4 mL/min (by C-G formula based on SCr of 0.52 mg/dL). Liver Function Tests: Recent Labs  Lab 01/23/19 0521  AST 20  ALT 14  ALKPHOS 51  BILITOT 0.9  PROT 6.0*  ALBUMIN 3.2*   No results for input(s): LIPASE, AMYLASE in the last 168 hours. No results for input(s): AMMONIA in the last 168 hours. Coagulation Profile: No results for input(s): INR, PROTIME in the last 168 hours. Cardiac Enzymes: No results for input(s): CKTOTAL, CKMB, CKMBINDEX, TROPONINI in the last 168 hours. BNP (last 3 results) No results for input(s): PROBNP in the last 8760 hours. HbA1C: No  results for input(s): HGBA1C in the last 72 hours. CBG: No results for input(s): GLUCAP in the last 168 hours. Lipid Profile: No results for input(s): CHOL, HDL, LDLCALC, TRIG, CHOLHDL, LDLDIRECT in the last 72 hours. Thyroid Function Tests: Recent Labs    01/22/19 2115  TSH 0.542   Anemia Panel: No results for input(s): VITAMINB12, FOLATE, FERRITIN, TIBC, IRON, RETICCTPCT in the last 72 hours. Sepsis Labs: No results for input(s): PROCALCITON, LATICACIDVEN in the last 168 hours.  Recent Results (from the past 240 hour(s))  SARS CORONAVIRUS 2 (TAT 6-24 HRS) Nasopharyngeal Nasopharyngeal Swab     Status: None   Collection Time: 01/22/19  9:41 PM   Specimen: Nasopharyngeal Swab  Result Value Ref Range Status   SARS Coronavirus 2 NEGATIVE NEGATIVE Final    Comment: (NOTE) SARS-CoV-2 target nucleic acids are NOT DETECTED. The SARS-CoV-2 RNA is generally detectable in upper and lower respiratory specimens during the acute phase of infection. Negative results do not preclude SARS-CoV-2 infection, do not rule out co-infections with  other pathogens, and should not be used as the sole basis for treatment or other patient management decisions. Negative results must be combined with clinical observations, patient history, and epidemiological information. The expected result is Negative. Fact Sheet for Patients: SugarRoll.be Fact Sheet for Healthcare Providers: https://www.woods-mathews.com/ This test is not yet approved or cleared by the [place] FDA and  has been authorized for detection and/or diagnosis of SARS-CoV-2 by FDA under an Emergency Use Authorization (EUA). This EUA will remain  in effect (meaning this test can be used) for the duration of the COVID-19 declaration under Section 56 4(b)(1) of the Act, 21 U.S.C. section 360bbb-3(b)(1), unless the authorization is terminated or revoked sooner. Performed at [facility],  [place] [address], [place], [place] [address]   MRSA PCR Screening     Status: None   Collection Time: 01/23/19  2:35 AM   Specimen: Nasal Mucosa; Nasopharyngeal  Result Value Ref Range Status   MRSA by PCR NEGATIVE NEGATIVE Final    Comment:        The GeneXpert MRSA Assay (FDA approved for NASAL specimens only), is one component of a comprehensive MRSA colonization surveillance program. It is not intended to diagnose MRSA infection nor to guide or monitor treatment for MRSA infections. Performed at [facility], [place] [address], [place], [place] [address]      Radiology Studies: CT LUMBAR SPINE WO CONTRAST  Result Date: 01/22/2019 CLINICAL DATA:  Initial evaluation for acute trauma, fall. EXAM: CT LUMBAR SPINE WITHOUT CONTRAST TECHNIQUE: Multidetector CT imaging of the lumbar spine was performed without intravenous contrast administration. Multiplanar CT image reconstructions were also generated. COMPARISON:  None available. FINDINGS: Segmentation: Standard. Lowest well-formed disc space labeled the L5-S1 level. Alignment: 5 mm anterolisthesis of L4 on L5, chronic and facet mediated. Alignment otherwise normal with preservation of the normal lumbar lordosis. Vertebrae: There is an acute compression fracture involving the superior endplate of L2 with up to 40% height loss and trace 2 mm bony retropulsion. No significant stenosis. This is benign/mechanical in appearance. Otherwise, vertebral body height maintained. No other acute or chronic fracture. Visualized sacrum and pelvis intact. No discrete or worrisome osseous lesions. Paraspinal and other soft tissues: Minimal paraspinal edema adjacent to the L2 compression fracture. Paraspinous soft tissues demonstrate no other acute finding. Extensive aorto bi-iliac atherosclerotic disease noted. Colonic diverticulosis noted. Disc levels: L1-2: Trace 2 mm bony retropulsion related to the L2 compression fracture. Moderate bilateral facet  hypertrophy. No significant stenosis. L2-3: Mild disc bulge. Moderate bilateral facet hypertrophy. No significant spinal stenosis. Foramina appear patent. L3-4: Mild annular disc bulge. Moderate facet and ligament flavum hypertrophy. No significant spinal stenosis. Foramina appear patent. L4-5: Chronic 5 mm anterolisthesis. Associated broad posterior pseudo disc bulge/uncovering, asymmetric to the right. Severe bilateral facet degeneration. Resultant mild-to-moderate canal and bilateral lateral recess stenosis. Mild bilateral L4 foraminal narrowing. L5-S1: Negative interspace. Advanced bilateral facet degeneration. No significant spinal stenosis. Mild left L5 foraminal narrowing. IMPRESSION: 1. Acute compression fracture involving the superior endplate of L2 with up to 40% height loss and trace 2 mm bony retropulsion. No significant stenosis. 2. No other acute traumatic injury within the lumbar spine. 3. 5 mm anterolisthesis of L4 on L5, chronic and facet mediated. Advanced bilateral facet degeneration and mild disc bulging at this level resultant mild-to-moderate canal and bilateral lateral recess stenosis. 4. Colonic diverticulosis. 5.  Aortic Atherosclerosis (ICD10-I70.0). Electronically Signed   By: Jeannine Boga M.D.   On: 01/22/2019 20:31  CT PELVIS WO CONTRAST  Result Date: 01/22/2019 CLINICAL DATA:  Low back and right hip pain after fall on Saturday. EXAM: CT PELVIS WITHOUT CONTRAST TECHNIQUE: Multidetector CT imaging of the pelvis was performed following the standard protocol without intravenous contrast. COMPARISON:  Right hip x-rays from same day. FINDINGS: Urinary Tract:  Left posterolateral bladder diverticulum. Bowel:  Sigmoid diverticulosis. Vascular/Lymphatic: Aortoiliac atherosclerotic vascular disease. No enlarged pelvic lymph nodes. Reproductive:  Prior hysterectomy.  No adnexal mass. Other:  None. Musculoskeletal: No acute fracture or dislocation. Prior bilateral total hip  arthroplasties. No hardware complication. Severe facet arthropathy at L4-L5 and L5-S1 with grade 1 anterolisthesis at L4-L5. 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 IMPRESSION: 1. No acute osseous abnormality. 2. Prior bilateral total hip arthroplasties without hardware complication. Electronically Signed   By: Titus Dubin M.D.   On: 01/22/2019 20:28   DG Abd Acute W/Chest  Result Date: 01/22/2019 CLINICAL DATA:  Constipation EXAM: DG ABDOMEN ACUTE W/ 1V CHEST COMPARISON:  01/22/2019 FINDINGS: Nonobstructive bowel gas pattern. Moderate stool throughout the colon. No organomegaly or free air. No suspicious calcification. Bilateral hip replacements. No acute bony abnormality. IMPRESSION: Moderate stool burden.  No acute findings. Electronically Signed   By: Rolm Baptise M.D.   On: 01/22/2019 18:08   DG Hip Unilat With Pelvis 2-3 Views Right  Result Date: 01/22/2019 CLINICAL DATA:  Right-sided hip pain EXAM: DG HIP (WITH OR WITHOUT PELVIS) 2-3V RIGHT COMPARISON:  12/23/2014 FINDINGS: SI joints are non widened. Pubic symphysis is intact. Status post bilateral hip replacements with normal alignment. Multiple calcifications about the right hip are chronic. IMPRESSION: Status post bilateral hip replacements. No definite acute osseous abnormality. Electronically Signed   By: Donavan Foil M.D.   On: 01/22/2019 17:58    Scheduled Meds: . Chlorhexidine Gluconate Cloth  6 each Topical Daily  . pantoprazole  40 mg Oral BID  . Rivaroxaban  15 mg Oral Q supper  . sodium chloride flush  3 mL Intravenous Q12H   Continuous Infusions: . sodium chloride    . diltiazem (CARDIZEM) infusion 7.5 mg/hr (01/23/19 0700)     LOS: 0 days   Marylu Lund, MD [facility] Pager On Amion  If 7PM-7AM, please contact night-coverage 01/23/2019, 3:37 PM

## 2019-01-23 NOTE — Consult Note (Addendum)
Cardiology Consultation:   Patient ID: Virginia Bullock; [id number]; 06/20/1928   Admit date: 01/22/2019 Date of Consult: 01/23/2019  Primary Care Provider: Harlan Stains, MD Primary Cardiologist: Dr. Margaretann Loveless, MD   Patient Profile:   Virginia Bullock is a 86 y.o. female with a hx of pulmonary fibrosis, moderate mitral and aortic valve regurgitation, hypothyroidism, and bilateral hip replacements who is being seen today for the evaluation of atrial fibrillation with RVR at the request of Dr. Myna Hidalgo.  History of Present Illness:   Virginia Bullock has a history as stated above who presented to [facility] on 01/22/2019 with severe right hip pain s/p mechanical fall several days ago. Patient reports she was watching TV on her couch and stood up to walk into the kitchen when she tripped and fell. Soon after the fall, she was able to bear weight and ambulate without much concern.  Unfortunately, over the last several days her pain increased and therefore she presented to the ED for further evaluation.  On my interview, she reports she is very active at baseline including mowing her grass and working part-time without issues. She has known chronic pulmonary fibrosis and has chronic cough secondary to GERD. She denies significant exertional dyspnea for change from her baseline, chest pain, palpitations, dizziness, syncope, and PND.   On ED presentation, she was found to be tachycardic with heart rates in the 150 range.  EKG performed which revealed atrial fibrillation with RVR at a rate of 138 bpm. CXR with no acute cardiopulmonary disease.  Imaging of hips and pelvis negative for fracture.  CT of lumbar spine concerning for acute compression fracture.  High-sensitivity troponin was negative.  She was started on diltiazem infusion with cardiology consultation.  Virginia Bullock was most recently seen by cardiology service (Dr. Margaretann Loveless) 06/05/2018 as a referral from her PCP for worsening shortness of breath and cough.  She was previously noted to have cardiomegaly on CXR at which time an echocardiogram was performed which demonstrated mild mitral valve regurgitation as well as mild to moderate aortic valve regurgitation and conservative management was recommended with serial echocardiograms every 2 years.  Previously a patient of Dr. Wynonia Lawman.  At that time she reported a longstanding cough however had recently noticed a change in quality several weeks prior to her OV.  Additionally described extreme fatigue which was quite concerning for her.  Dr. 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  Along with COVID-19 testing, an echocardiogram was ordered to evaluate for worsening valvular disease.  This was ultimately performed on 06/24/2018 which demonstrated an LVEF of 55 to 60% and moderate mitral regurgitation, slightly changed from previous echocardiogram however not felt to be the source of her profound fatigue and dyspnea.  A Lexiscan stress test to rule out ischemia was recommended however patient deferred at that time.  In regards to her pulmonary fibrosis, she is followed closely with pulmonary medicine last seen 12/30/2018.  At that time, patient also had complaints of chronic cough and dyspnea with exertion. Chronic cough felt to be related to GERD for which she is on Protonix. Notes report no interest in supplemental oxygenation and no ETT ventilation.  She was continued on low-dose steroid and encouraged to use cough suppressants and antireflux medications.  Past Medical History:  Diagnosis Date   Arthritis    right hip   Basal cell  carcinoma of skin    Complication of anesthesia    "epidural with 5th baby-stopped breathing and went numb, 'died'"   Diverticulosis of colon without hemorrhage 26/19/2016   Goiter    History of herpes zoster 02/27/2014   History of shingles    x2    Hypothyroidism    Impaired hearing    Loss of hearing 02/27/2014   both   Migraine headache 02/27/2014   migraines, aura without pain   OA (osteoarthritis) of hip 02/20/2014   Osteopenia 02/27/2014   Post-nasal drip     Past Surgical History:  Procedure Laterality Date   ABDOMINAL HYSTERECTOMY  2011   partial   BASAL CELL CARCINOMA EXCISION     CERVICAL SPINE SURGERY  24 years ago   2 ruptured disc   EYE SURGERY Bilateral    lens replacements for cataract   INNER EAR SURGERY Bilateral as child   double mastoid   KNEE ARTHROSCOPY Left 30 years ago   "born with knee cap problem"   MASTOIDECTOMY Bilateral    as a baby   NASAL SEPTUM SURGERY     TONSILLECTOMY  age 28 and 58   x2   TOTAL HIP ARTHROPLASTY Right 02/20/2014   Procedure: RIGHT TOTAL HIP ARTHROPLASTY ANTERIOR APPROACH;  Surgeon: Gearlean Alf, MD;  Location: [facility];  Service: Orthopedics;  Laterality: Right;   TOTAL HIP ARTHROPLASTY Left 12/23/2014   Procedure: LEFT TOTAL HIP ARTHROPLASTY ANTERIOR APPROACH;  Surgeon: Gaynelle Arabian, MD;  Location: [facility];  Service: Orthopedics;  Laterality: Left;     Prior to Admission medications   Medication Sig Start Date End Date Taking? Authorizing Provider  acetaminophen (TYLENOL) 325 MG tablet Take 650 mg by mouth every 6 (six) hours as needed.   Yes [provider]  Acetylcysteine 600 MG CAPS Take 1 capsule (600 mg total) by mouth 2 (two) times daily. 12/30/18  Yes Olalere, Adewale A, MD  pantoprazole (PROTONIX) 40 MG tablet Take 40 mg by mouth 2 (two) times daily.  09/06/18  Yes [provider]  predniSONE (DELTASONE) 5 MG tablet TAKE ONE TABLET BY MOUTH DAILY WITH BREAKFAST Patient not taking: Reported on 01/22/2019 12/11/18   Laurin Coder, MD    Inpatient Medications: Scheduled Meds:  Acetylcysteine  600 mg Oral BID   Chlorhexidine Gluconate Cloth  6 each Topical Daily   pantoprazole  40 mg Oral BID   Rivaroxaban  15 mg Oral Q supper   sodium  chloride flush  3 mL Intravenous Q12H   Continuous Infusions:  sodium chloride     sodium chloride 75 mL/hr at 01/23/19 0900   diltiazem (CARDIZEM) infusion 7.5 mg/hr (01/23/19 0700)   PRN Meds: sodium chloride, acetaminophen, bisacodyl, HYDROcodone-acetaminophen, HYDROmorphone (DILAUDID) injection, ondansetron (ZOFRAN) IV, polyethylene glycol, sodium chloride flush  Allergies:    Allergies  Allergen Reactions   Benadryl [Diphenhydramine Hcl] Anxiety    Panic attack   Codeine Nausea And Vomiting   Coumadin [Warfarin Sodium]     Severe rash   Demerol [Meperidine] Nausea And Vomiting    Social History:   Social History   Socioeconomic History   Marital status: Widowed    Spouse name: Not on file   Number of children: Not on file   Years of education: Not on file   Highest education level: Not on file  Occupational History   Not on file  Tobacco Use   Smoking status: Former Smoker    Types: Cigarettes   Smokeless tobacco:  Never Used   Tobacco comment: quit 25 years ago  Substance and Sexual Activity   Alcohol use: Yes    Comment: glass of wine daily   Drug use: No   Sexual activity: Not on file  Other Topics Concern   Not on file  Social History Narrative   Not on file   Social Determinants of Health   Financial Resource Strain:    Difficulty of Paying Living Expenses: Not on file  Food Insecurity:    Worried About  in the Last Year: Not on file   Ran Out of Food in the Last Year: Not on file  Transportation Needs:    Lack of Transportation (Medical): Not on file   Lack of Transportation (Non-Medical): Not on file  Physical Activity:    Days of Exercise per Week: Not on file   Minutes of Exercise per Session: Not on file  Stress:    Feeling of Stress : Not on file  Social Connections:    Frequency of Communication with Friends and Family: Not on file   Frequency of Social Gatherings with Friends and Family: Not on file   Attends  Religious Services: Not on file   Active Member of Clubs or Organizations: Not on file   Attends Archivist Meetings: Not on file   Marital Status: Not on file  Intimate Partner Violence:    Fear of Current or Ex-Partner: Not on file   Emotionally Abused: Not on file   Physically Abused: Not on file   Sexually Abused: Not on file    Family History:   Family History  Problem Relation Age of Onset   Cancer Maternal Grandmother    Tuberculosis Maternal Grandfather    Family Status:  Family Status  Relation Name Status   MGM  Deceased   MGF  Deceased    ROS:  Please see the history of present illness.  All other ROS reviewed and negative.     Physical Exam/Data:   Vitals:   01/23/19 0400 01/23/19 0730 01/23/19 0800 01/23/19 0901  BP: (!) 94/49  118/64 118/65  Pulse: (!) 137  (!) 101 97  Resp: (!) 27  16 (!) 21  Temp:  98.2 F (36.8 C)    TempSrc:  Oral    SpO2: 91%  95% 95%  Weight:      Height:        Intake/Output Summary (Last 24 hours) at 01/23/2019 0921 Last data filed at 01/23/2019 0900 Gross per 24 hour  Intake 1120.57 ml  Output --  Net 1120.57 ml   Filed Weights   01/22/19 2300 01/23/19 0237  Weight: 125 lb (56.7 kg) 125 lb 14.1 oz (57.1 kg)   Body mass index is 23.02 kg/m.   General: Appears younger than stated age, well developed, well nourished, NAD Neck: Negative for carotid bruits. No JVD Lungs:Clear to ausculation bilaterally. No wheezes, rales, or rhonchi. Breathing is unlabored. Cardiovascular: RRR with S1 S2. + murmur Abdomen: Soft, non-tender, non-distended. No obvious abdominal masses. Extremities: No edema. DP pulses 1+ bilaterally Neuro: Alert and oriented. No focal deficits. No facial asymmetry. MAE spontaneously. Psych: Responds to questions appropriately with normal affect.     EKG:  The EKG was personally reviewed and demonstrates: 01/22/2019 atrial fibrillation, HR 138 bpm Telemetry:  Telemetry was personally reviewed  and demonstrates: 01/23/2019 sinus tachycardia/sinus heart rates 90s to low 100s  Relevant CV Studies:  Echocardiogram 06/24/2018:   1. The left ventricle  has normal systolic function, with an ejection fraction of 55-60%. The cavity size was normal. Left ventricular diastolic parameters were normal.  2. The right ventricle has normal systolic function. The cavity was normal. There is no increase in right ventricular wall thickness.  3. Left atrial size was mildly dilated.  4. The mitral valve is degenerative. Moderate thickening of the mitral valve leaflet. Moderate calcification of the mitral valve leaflet. There is moderate mitral annular calcification present. Mitral valve regurgitation is moderate by color flow  Doppler.  5. Tricuspid valve regurgitation is mild-moderate.  6. The aortic valve is tricuspid. Moderate thickening of the aortic valve. Sclerosis without any evidence of stenosis of the aortic valve. Aortic valve regurgitation is mild by color flow Doppler.   Laboratory Data:  Chemistry Recent Labs  Lab 01/22/19 2115 01/23/19 0521  NA 139 136  K 4.0 3.9  CL 108 106  CO2 23 23  GLUCOSE 86 102*  BUN 20 18  CREATININE 0.58 0.52  CALCIUM 8.7* 8.1*  GFRNONAA >60 >60  GFRAA >60 >60  ANIONGAP 8 7    Total Protein  Date Value Ref Range Status  01/23/2019 6.0 (L) 6.5 - 8.1 g/dL Final   Albumin  Date Value Ref Range Status  01/23/2019 3.2 (L) 3.5 - 5.0 g/dL Final   AST  Date Value Ref Range Status  01/23/2019 20 15 - 41 U/L Final   ALT  Date Value Ref Range Status  01/23/2019 14 0 - 44 U/L Final   Alkaline Phosphatase  Date Value Ref Range Status  01/23/2019 51 38 - 126 U/L Final   Total Bilirubin  Date Value Ref Range Status  01/23/2019 0.9 0.3 - 1.2 mg/dL Final   Hematology Recent Labs  Lab 01/22/19 2115 01/23/19 0521  WBC 12.1* 11.4*  RBC 3.59* 3.36*  HGB 11.7* 11.1*  HCT 35.8* 34.5*  MCV 99.7 102.7*  MCH 32.6 33.0  MCHC 32.7 32.2  RDW 12.9  12.8  PLT 209 192   Cardiac EnzymesNo results for input(s): TROPONINI in the last 168 hours. No results for input(s): TROPIPOC in the last 168 hours.  BNPNo results for input(s): BNP, PROBNP in the last 168 hours.  DDimer No results for input(s): DDIMER in the last 168 hours. TSH:  Lab Results  Component Value Date   TSH 0.542 01/22/2019   Lipids:No results found for: CHOL, HDL, LDLCALC, LDLDIRECT, TRIG, CHOLHDL HgbA1c:No results found for: HGBA1C  Radiology/Studies:  CT LUMBAR SPINE WO CONTRAST  Result Date: 01/22/2019 CLINICAL DATA:  Initial evaluation for acute trauma, fall. EXAM: CT LUMBAR SPINE WITHOUT CONTRAST TECHNIQUE: Multidetector CT imaging of the lumbar spine was performed without intravenous contrast administration. Multiplanar CT image reconstructions were also generated. COMPARISON:  None available. FINDINGS: Segmentation: Standard. Lowest well-formed disc space labeled the L5-S1 level. Alignment: 5 mm anterolisthesis of L4 on L5, chronic and facet mediated. Alignment otherwise normal with preservation of the normal lumbar lordosis. Vertebrae: There is an acute compression fracture involving the superior endplate of L2 with up to 40% height loss and trace 2 mm bony retropulsion. No significant stenosis. This is benign/mechanical in appearance. Otherwise, vertebral body height maintained. No other acute or chronic fracture. Visualized sacrum and pelvis intact. No discrete or worrisome osseous lesions. Paraspinal and other soft tissues: Minimal paraspinal edema adjacent to the L2 compression fracture. Paraspinous soft tissues demonstrate no other acute finding. Extensive aorto bi-iliac atherosclerotic disease noted. Colonic diverticulosis noted. Disc levels: L1-2: Trace 2 mm bony retropulsion related to  the L2 compression fracture. Moderate bilateral facet hypertrophy. No significant stenosis. L2-3: Mild disc bulge. Moderate bilateral facet hypertrophy. No significant spinal stenosis.  Foramina appear patent. L3-4: Mild annular disc bulge. Moderate facet and ligament flavum hypertrophy. No significant spinal stenosis. Foramina appear patent. L4-5: Chronic 5 mm anterolisthesis. Associated broad posterior pseudo disc bulge/uncovering, asymmetric to the right. Severe bilateral facet degeneration. Resultant mild-to-moderate canal and bilateral lateral recess stenosis. Mild bilateral L4 foraminal narrowing. L5-S1: Negative interspace. Advanced bilateral facet degeneration. No significant spinal stenosis. Mild left L5 foraminal narrowing. IMPRESSION: 1. Acute compression fracture involving the superior endplate of L2 with up to 40% height loss and trace 2 mm bony retropulsion. No significant stenosis. 2. No other acute traumatic injury within the lumbar spine. 3. 5 mm anterolisthesis of L4 on L5, chronic and facet mediated. Advanced bilateral facet degeneration and mild disc bulging at this level resultant mild-to-moderate canal and bilateral lateral recess stenosis. 4. Colonic diverticulosis. 5.  Aortic Atherosclerosis (ICD10-I70.0). Electronically Signed   By: Jeannine Boga M.D.   On: 01/22/2019 20:31   CT PELVIS WO CONTRAST  Result Date: 01/22/2019 CLINICAL DATA:  Low back and right hip pain after fall on Saturday. EXAM: CT PELVIS WITHOUT CONTRAST TECHNIQUE: Multidetector CT imaging of the pelvis was performed following the standard protocol without intravenous contrast. COMPARISON:  Right hip x-rays from same day. FINDINGS: Urinary Tract:  Left posterolateral bladder diverticulum. Bowel:  Sigmoid diverticulosis. Vascular/Lymphatic: Aortoiliac atherosclerotic vascular disease. No enlarged pelvic lymph nodes. Reproductive:  Prior hysterectomy.  No adnexal mass. Other:  None. Musculoskeletal: No acute fracture or dislocation. Prior bilateral total hip arthroplasties. No hardware complication. Severe facet arthropathy at L4-L5 and L5-S1 with grade 1 anterolisthesis at L4-L5. 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 IMPRESSION: 1. No acute osseous abnormality. 2. Prior bilateral total hip arthroplasties without hardware complication. Electronically Signed   By: Titus Dubin M.D.   On: 01/22/2019 20:28   DG Abd Acute W/Chest  Result Date: 01/22/2019 CLINICAL DATA:  Constipation EXAM: DG ABDOMEN ACUTE W/ 1V CHEST COMPARISON:  01/22/2019 FINDINGS: Nonobstructive bowel gas pattern. Moderate stool throughout the colon. No organomegaly or free air. No suspicious calcification. Bilateral hip replacements. No acute bony abnormality. IMPRESSION: Moderate stool burden.  No acute findings. Electronically Signed   By: Rolm Baptise M.D.   On: 01/22/2019 18:08   DG Hip Unilat With Pelvis 2-3 Views Right  Result Date: 01/22/2019 CLINICAL DATA:  Right-sided hip pain EXAM: DG HIP (WITH OR WITHOUT PELVIS) 2-3V RIGHT COMPARISON:  12/23/2014 FINDINGS: SI joints are non widened. Pubic symphysis is intact. Status post bilateral hip replacements with normal alignment. Multiple calcifications about the right hip are chronic. IMPRESSION: Status post bilateral hip replacements. No definite acute osseous abnormality. Electronically Signed   By: Donavan Foil M.D.   On: 01/22/2019 17:58    Assessment and Plan:   1.  New onset atrial fibrillation with RVR: -Patient presented with right hip pain s/p mechanical fall on 01/18/2019 found to have an L2 compression fracture.  On presentation telemetry revealed heart rate in the 140-150 range with subsequent EKG confirming atrial fibrillation with RVR, likely in the setting of acute event. -Initially treated with diltiazem infusion with a goal heart rate of 65-110 -Patient has since converted to ST/NSR with rates in the high 90s low 100s -As above, echocardiogram performed 06/2018 in the setting of profound fatigue and dyspnea appears to be relatively unchanged from prior echocardiogram with an LV EF of 55 to 60%,  mild LAE, moderate MR, mild to  moderate TR and mild AI -TSH, 0.542 -CHA2DS2VASc found to be 2 therefore DOAC (Xarelto) was initiated by primary team.  -Will need to monitor CBC closely as she has presented with anemia and fall, hemoglobin 11.1 -Continue diltiazem infusion and once heart rate at goal, will transition to p.o.  -If heart rate remains mildly elevated, consider low-dose beta-blocker therapy however will need to be cautious given soft BPs   2.  L2 compression fracture: -As above, patient presented with right hip pain s/p mechanical fall on 01/18/2019 found to have acute appearing left to compression fracture on CT scan -Management per IM  3.  History of pulmonary fibrosis: -Followed closely with pulmonary medicine, last seen 12/30/2018.  At that time, patient also had complaints of chronic cough and dyspnea with exertion.  Notes report no interest in supplemental oxygenation and no ETT ventilation.  She was continued on low-dose steroid and encouraged to use cough suppressants and antireflux medications. -O2 saturations stable at this time  For questions or updates, please contact [place] Please consult [email or web address] for contact info under Cardiology/STEMI.   Lyndel Safe NP-C [place] Pager: [phone] 01/23/2019 9:21 AM  Pateint examined chart reviewed discussed care with patient and nurse. Exam with elderly white female looking younger than stated age. Lungs clear Pain in lower back from compression fracture No murmur basilar crackles lungs  post bilateral THR no edema. Telemetry now with NSR. Agree with xarelto for PAF CHADVASC 2 Continue iv cardizem and transition to PO in am if she maintains NSR Pain control will be important in regard to high adrenergic tone not precipitating more PAF. She has significant pulmonary fibrosis Sats are fine   Jenkins Rouge MD [facility]

## 2019-01-24 LAB — CBC
HCT: 32.4 % — ABNORMAL LOW (ref 36.0–46.0)
Hemoglobin: 10.2 g/dL — ABNORMAL LOW (ref 12.0–15.0)
MCH: 32 pg (ref 26.0–34.0)
MCHC: 31.5 g/dL (ref 30.0–36.0)
MCV: 101.6 fL — ABNORMAL HIGH (ref 80.0–100.0)
Platelets: 207 10*3/uL (ref 150–400)
RBC: 3.19 MIL/uL — ABNORMAL LOW (ref 3.87–5.11)
RDW: 12.8 % (ref 11.5–15.5)
WBC: 9.8 10*3/uL (ref 4.0–10.5)
nRBC: 0 % (ref 0.0–0.2)

## 2019-01-24 LAB — BASIC METABOLIC PANEL
Anion gap: 8 (ref 5–15)
BUN: 24 mg/dL — ABNORMAL HIGH (ref 8–23)
CO2: 22 mmol/L (ref 22–32)
Calcium: 8.4 mg/dL — ABNORMAL LOW (ref 8.9–10.3)
Chloride: 106 mmol/L (ref 98–111)
Creatinine, Ser: 0.73 mg/dL (ref 0.44–1.00)
GFR calc Af Amer: >60 mL/min (ref >60)
GFR calc non Af Amer: >60 mL/min (ref >60)
Glucose, Bld: 115 mg/dL — ABNORMAL HIGH (ref 70–99)
Potassium: 4 mmol/L (ref 3.5–5.1)
Sodium: 136 mmol/L (ref 135–145)

## 2019-01-24 MED ORDER — HYDROGEN PEROXIDE 3 % EX SOLN
CUTANEOUS | Status: AC
  Filled 2019-01-24: qty 473

## 2019-01-24 NOTE — Progress Notes (Signed)
PT Cancellation Note  Patient Details Name: Virginia Bullock MRN: [id number] DOB: 1928-11-02   Cancelled Treatment:    Reason Eval/Treat Not Completed: Patient declined, no reason specified  Pt declined PT due to fatigue.  PT tried to encourage but pt reports just got back in bed (30 mins-1 hr) and had busy morning with IV coming out. Pt is walking to bathroom with nursing.  Will f/u as able. Maggie Font, PT Acute Rehab Services Pager [phone] [facility] [phone]    Karlton Lemon 01/24/2019, 1:25 PM

## 2019-01-24 NOTE — Progress Notes (Signed)
Progress Note  Patient Name: Virginia Bullock Date of Encounter: 01/24/2019  Primary Cardiologist: Dr. Margaretann Loveless, MD   Subjective   Feeling well today. Still having intermittent AF however in NSR with PACs currently. No CP, SOB   Inpatient Medications    Scheduled Meds: . Chlorhexidine Gluconate Cloth  6 each Topical Daily  . pantoprazole  40 mg Oral BID  . Rivaroxaban  15 mg Oral Q supper  . sodium chloride flush  3 mL Intravenous Q12H   Continuous Infusions: . sodium chloride    . diltiazem (CARDIZEM) infusion 7.5 mg/hr (01/24/19 0600)   PRN Meds: sodium chloride, acetaminophen, bisacodyl, HYDROcodone-acetaminophen, HYDROmorphone (DILAUDID) injection, ondansetron (ZOFRAN) IV, polyethylene glycol, sodium chloride flush   Vital Signs    Vitals:   01/24/19 0200 01/24/19 0335 01/24/19 0522 01/24/19 0700  BP: 102/85   (!) 113/56  Pulse: 89   60  Resp: (!) 23   20  Temp:  98.7 F (37.1 C)    TempSrc:  Axillary    SpO2: 95%   95%  Weight:   56.9 kg   Height:        Intake/Output Summary (Last 24 hours) at 01/24/2019 0825 Last data filed at 01/24/2019 0600 Gross per 24 hour  Intake 243.04 ml  Output --  Net 243.04 ml   Filed Weights   01/22/19 2300 01/23/19 0237 01/24/19 0522  Weight: 56.7 kg 57.1 kg 56.9 kg    Physical Exam   General: Well developed, well nourished, NAD Neck: Negative for carotid bruits. No JVD Lungs:Clear to ausculation bilaterally. No wheezes, rales, or rhonchi. Breathing is unlabored. Cardiovascular: RRR with S1 S2. No murmurs Abdomen: Soft, non-tender, non-distended. No obvious abdominal masses. Extremities: No edema. DP pulses 2+ bilaterally Neuro: Alert and oriented. No focal deficits. No facial asymmetry. MAE spontaneously. Psych: Responds to questions appropriately with normal affect.    Labs    Chemistry Recent Labs  Lab 01/22/19 2115 01/23/19 0521 01/24/19 0202  NA 139 136 136  K 4.0 3.9 4.0  CL 108 106 106  CO2 23 23  22   GLUCOSE 86 102* 115*  BUN 20 18 24*  CREATININE 0.58 0.52 0.73  CALCIUM 8.7* 8.1* 8.4*  PROT  --  6.0*  --   ALBUMIN  --  3.2*  --   AST  --  20  --   ALT  --  14  --   ALKPHOS  --  51  --   BILITOT  --  0.9  --   GFRNONAA >60 >60 >60  GFRAA >60 >60 >60  ANIONGAP 8 7 8      Hematology Recent Labs  Lab 01/22/19 2115 01/23/19 0521 01/24/19 0202  WBC 12.1* 11.4* 9.8  RBC 3.59* 3.36* 3.19*  HGB 11.7* 11.1* 10.2*  HCT 35.8* 34.5* 32.4*  MCV 99.7 102.7* 101.6*  MCH 32.6 33.0 32.0  MCHC 32.7 32.2 31.5  RDW 12.9 12.8 12.8  PLT 209 192 207    Cardiac EnzymesNo results for input(s): TROPONINI in the last 168 hours. No results for input(s): TROPIPOC in the last 168 hours.   BNPNo results for input(s): BNP, PROBNP in the last 168 hours.   DDimer No results for input(s): DDIMER in the last 168 hours.   Radiology    CT LUMBAR SPINE WO CONTRAST  Result Date: 01/22/2019 CLINICAL DATA:  Initial evaluation for acute trauma, fall. EXAM: CT LUMBAR SPINE WITHOUT CONTRAST TECHNIQUE: Multidetector CT imaging of the lumbar spine was  performed without intravenous contrast administration. Multiplanar CT image reconstructions were also generated. COMPARISON:  None available. FINDINGS: Segmentation: Standard. Lowest well-formed disc space labeled the L5-S1 level. Alignment: 5 mm anterolisthesis of L4 on L5, chronic and facet mediated. Alignment otherwise normal with preservation of the normal lumbar lordosis. Vertebrae: There is an acute compression fracture involving the superior endplate of L2 with up to 40% height loss and trace 2 mm bony retropulsion. No significant stenosis. This is benign/mechanical in appearance. Otherwise, vertebral body height maintained. No other acute or chronic fracture. Visualized sacrum and pelvis intact. No discrete or worrisome osseous lesions. Paraspinal and other soft tissues: Minimal paraspinal edema adjacent to the L2 compression fracture. Paraspinous soft  tissues demonstrate no other acute finding. Extensive aorto bi-iliac atherosclerotic disease noted. Colonic diverticulosis noted. Disc levels: L1-2: Trace 2 mm bony retropulsion related to the L2 compression fracture. Moderate bilateral facet hypertrophy. No significant stenosis. L2-3: Mild disc bulge. Moderate bilateral facet hypertrophy. No significant spinal stenosis. Foramina appear patent. L3-4: Mild annular disc bulge. Moderate facet and ligament flavum hypertrophy. No significant spinal stenosis. Foramina appear patent. L4-5: Chronic 5 mm anterolisthesis. Associated broad posterior pseudo disc bulge/uncovering, asymmetric to the right. Severe bilateral facet degeneration. Resultant mild-to-moderate canal and bilateral lateral recess stenosis. Mild bilateral L4 foraminal narrowing. L5-S1: Negative interspace. Advanced bilateral facet degeneration. No significant spinal stenosis. Mild left L5 foraminal narrowing. IMPRESSION: 1. Acute compression fracture involving the superior endplate of L2 with up to 40% height loss and trace 2 mm bony retropulsion. No significant stenosis. 2. No other acute traumatic injury within the lumbar spine. 3. 5 mm anterolisthesis of L4 on L5, chronic and facet mediated. Advanced bilateral facet degeneration and mild disc bulging at this level resultant mild-to-moderate canal and bilateral lateral recess stenosis. 4. Colonic diverticulosis. 5.  Aortic Atherosclerosis (ICD10-I70.0). Electronically Signed   By: Jeannine Boga M.D.   On: 01/22/2019 20:31   CT PELVIS WO CONTRAST  Result Date: 01/22/2019 CLINICAL DATA:  Low back and right hip pain after fall on Saturday. EXAM: CT PELVIS WITHOUT CONTRAST TECHNIQUE: Multidetector CT imaging of the pelvis was performed following the standard protocol without intravenous contrast. COMPARISON:  Right hip x-rays from same day. FINDINGS: Urinary Tract:  Left posterolateral bladder diverticulum. Bowel:  Sigmoid diverticulosis.  Vascular/Lymphatic: Aortoiliac atherosclerotic vascular disease. No enlarged pelvic lymph nodes. Reproductive:  Prior hysterectomy.  No adnexal mass. Other:  None. Musculoskeletal: No acute fracture or dislocation. Prior bilateral total hip arthroplasties. No hardware complication. Severe facet arthropathy at L4-L5 and L5-S1 with grade 1 anterolisthesis at L4-L5. 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 IMPRESSION: 1. No acute osseous abnormality. 2. Prior bilateral total hip arthroplasties without hardware complication. Electronically Signed   By: Titus Dubin M.D.   On: 01/22/2019 20:28   DG Abd Acute W/Chest  Result Date: 01/22/2019 CLINICAL DATA:  Constipation EXAM: DG ABDOMEN ACUTE W/ 1V CHEST COMPARISON:  01/22/2019 FINDINGS: Nonobstructive bowel gas pattern. Moderate stool throughout the colon. No organomegaly or free air. No suspicious calcification. Bilateral hip replacements. No acute bony abnormality. IMPRESSION: Moderate stool burden.  No acute findings. Electronically Signed   By: Rolm Baptise M.D.   On: 01/22/2019 18:08   DG Hip Unilat With Pelvis 2-3 Views Right  Result Date: 01/22/2019 CLINICAL DATA:  Right-sided hip pain EXAM: DG HIP (WITH OR WITHOUT PELVIS) 2-3V RIGHT COMPARISON:  12/23/2014 FINDINGS: SI joints are non widened. Pubic symphysis is intact. Status post bilateral hip replacements with normal alignment. Multiple calcifications about  the right hip are chronic. IMPRESSION: Status post bilateral hip replacements. No definite acute osseous abnormality. Electronically Signed   By: Donavan Foil M.D.   On: 01/22/2019 17:58   Telemetry    01/24/19 NSR with PACs HR 70-80's - Personally Reviewed  ECG    No new tracing as of 01/24/19- Personally Reviewed  Cardiac Studies   Echocardiogram 06/24/2018:  1. The left ventricle has normal systolic function, with an ejection fraction of 55-60%. The cavity size was normal. Left ventricular diastolic  parameters were normal. 2. The right ventricle has normal systolic function. The cavity was normal. There is no increase in right ventricular wall thickness. 3. Left atrial size was mildly dilated. 4. The mitral valve is degenerative. Moderate thickening of the mitral valve leaflet. Moderate calcification of the mitral valve leaflet. There is moderate mitral annular calcification present. Mitral valve regurgitation is moderate by color flow  Doppler. 5. Tricuspid valve regurgitation is mild-moderate. 6. The aortic valve is tricuspid. Moderate thickening of the aortic valve. Sclerosis without any evidence of stenosis of the aortic valve. Aortic valve regurgitation is mild by color flow Doppler.  Patient Profile     86 y.o. female with a hx of pulmonary fibrosis, moderate mitral and aortic valve regurgitation, hypothyroidism, and bilateral hip replacements who is being seen today for the evaluation of atrial fibrillation with RVR at the request of Dr. Myna Hidalgo.  Assessment & Plan    1.  New onset atrial fibrillation with RVR: -Patient presented with right hip pain s/p mechanical fall on 01/18/2019 found to have an L2 compression fracture.  On presentation telemetry revealed heart rate in the 140-150 range with subsequent EKG confirming atrial fibrillation with RVR, likely in the setting of acute event. -Initially treated with diltiazem infusion with a goal heart rate of 65-110 -Echocardiogram performed 06/2018 in the setting of profound fatigue and dyspnea appears to be relatively unchanged from prior echocardiogram with an LV EF of 55 to 60%, mild LAE, moderate MR, mild to moderate TR and mild AI -CHA2DS2VASc 2 therefore DOAC (Xarelto) was initiated by primary team -Continue diltiazem infusion and once heart rate at goal, will transition to p.o.>>having frequent PACs  -Would consider starting carvedilol at 3.125 BID and monitoring BP closely    2.  L2 compression fracture: -As above, patient  presented with right hip pain s/p mechanical fall on 01/18/2019 found to have acute appearing left to compression fracture on CT scan -Management per IM  3.  History of pulmonary fibrosis: -Followed closely with pulmonary medicine, last seen 12/30/2018.  At that time, patient also had complaints of chronic cough and dyspnea with exertion.  Notes report no interest in supplemental oxygenation and no ETT ventilation.  She was continued on low-dose steroid and encouraged to use cough suppressants and antireflux medications. -O2 saturations stable at this time   Signed, Kathyrn Drown NP-C [facility] Pager: [phone] 01/24/2019, 8:25 AM     For questions or updates, please contact   Please consult [email or web address] for contact info under Cardiology/STEMI.

## 2019-01-24 NOTE — Progress Notes (Signed)
PROGRESS NOTE    Virginia Bullock  [id number] DOB: 17-May-1928 DOA: 01/22/2019 PCP: Harlan Stains, MD    Brief Narrative:  Per Dr. Myna Hidalgo: 86 y.o. female with medical history significant for pulmonary fibrosis, history of hypothyroidism, and history of bilateral hip replacements, now presenting to the emergency department with severe right hip pain after a mechanical fall several days ago.  Patient reports that she had been in her usual state of health when she had a ground-level mechanical fall at home on 01/18/2019, landing on her right hip.  She has been able to bear weight since then, but has had severe pain at the posterior aspect of her right hip/buttock region.  She denies any history of palpitations, reports occasional fleeting episodes of chest discomfort though that occur less than monthly and usually while she is lying in bed.  She follows with pulmonology for her pulmonary fibrosis, had been on low-dose prednisone, but has stopped this 1 week ago.  She denies any recent worsening in her shortness of breath and denies any fevers or chills.  She denies any history of bleeding problems but reports developing a rash when she was started on Coumadin after her hip replacements.  ED Course: Upon arrival to the ED, patient is found to be afebrile, saturating mid 90s on room air, slightly tachypneic, tachycardic to 149, and with stable blood pressure.  EKG features atrial fibrillation with RVR, rate 138.  KUB and chest x-ray with moderate stool burden but otherwise no acute findings.  Radiographs of the hips without any definite acute osseous abnormality and CT of the pelvis is also negative.  CT lumbar spine concerning for acute compression fracture involving the superior endplate of L2 with trace bony retropulsion but without significant stenosis.  Chemistry panel is unremarkable and CBC notable for mild normocytic anemia and leukocytosis.  High-sensitivity troponin was normal.  Cardiology was  consulted by the ED physician, indicated that they would see the patient in the morning, and recommended medical admission.  Patient was started on diltiazem infusion, given IV Dilaudid, and hospitalist consulted for admission.  Assessment & Plan:   Principal Problem:   Atrial fibrillation with RVR (HCC) Active Problems:   Closed compression fracture of L2 lumbar vertebra, initial encounter ([place])   Pulmonary fibrosis ([place])   1. New-onset atrial fibrillation with RVR  - Presents with right hip pain since a fall on 01/18/19, noted to have HR in 140's and EKG confirms atrial fibrillation with RVR  - No anginal complaints to suggest an ischemic etiology and HS troponin was normal  - She had echo in June 2020 with EF 55-60%, mild LAE, moderate MR, mild-moderate TR, and mild AI  - CHADS-VASc at least 3 noted - She was started on diltiazem infusion in ED -Cardiology following.  Recs to continue Cardizem drip through today, possible transition to oral Cardizem tomorrow  2. L2 compression fracture   - Presents with right hip pain after a fall on 01/18/19, has no acute osseous abnormality on CT pelvis, but is noted to have acute-appearing L2 compression fracture  - Continue with analgesics as needed -Physical therapy following, pt declined eval today secondary to fatigue  3. Pulmonary fibrosis  - Stable, stopped low-dose prednisone one week ago   -Stable at this time  DVT prophylaxis: Xarelto Code Status: DNR Family Communication: Patient in room, family not at bedside Disposition Plan: Uncertain at this time  Consultants:   Cardiology  Procedures:     Antimicrobials: Anti-infectives (From admission,  onward)   None      Subjective: No complaints at this time  Objective: Vitals:   01/24/19 1100 01/24/19 1200 01/24/19 1300 01/24/19 1400  BP: (!) 127/92 (!) 121/94 117/80 121/67  Pulse: 95 81 77 (!) 102  Resp: 20 (!) 23 (!) 27 (!) 26  Temp:      TempSrc:      SpO2: 91%  94% 94% 92%  Weight:      Height:        Intake/Output Summary (Last 24 hours) at 01/24/2019 1413 Last data filed at 01/24/2019 1100 Gross per 24 hour  Intake 155.61 ml  Output --  Net 155.61 ml   Filed Weights   01/22/19 2300 01/23/19 0237 01/24/19 0522  Weight: 56.7 kg 57.1 kg 56.9 kg    Examination: General exam: Awake, laying in bed, in nad Respiratory system: Normal respiratory effort, no wheezing Cardiovascular system: regular rate, s1, s2 Gastrointestinal system: Soft, nondistended, positive BS Central nervous system: CN2-12 grossly intact, strength intact Extremities: Perfused, no clubbing Skin: Normal skin turgor, no notable skin lesions seen Psychiatry: Mood normal // no visual hallucinations   Data Reviewed: I have personally reviewed following labs and imaging studies  CBC: Recent Labs  Lab 01/22/19 2115 01/23/19 0521 01/24/19 0202  WBC 12.1* 11.4* 9.8  NEUTROABS 7.5  --   --   HGB 11.7* 11.1* 10.2*  HCT 35.8* 34.5* 32.4*  MCV 99.7 102.7* 101.6*  PLT 209 192 [id number]   Basic Metabolic Panel: Recent Labs  Lab 01/22/19 2115 01/23/19 0521 01/24/19 0202  NA 139 136 136  K 4.0 3.9 4.0  CL 108 106 106  CO2 23 23 22   GLUCOSE 86 102* 115*  BUN 20 18 24*  CREATININE 0.58 0.52 0.73  CALCIUM 8.7* 8.1* 8.4*  MG 1.8 1.9  --    GFR: Estimated Creatinine Clearance: 38.4 mL/min (by C-G formula based on SCr of 0.73 mg/dL). Liver Function Tests: Recent Labs  Lab 01/23/19 0521  AST 20  ALT 14  ALKPHOS 51  BILITOT 0.9  PROT 6.0*  ALBUMIN 3.2*   No results for input(s): LIPASE, AMYLASE in the last 168 hours. No results for input(s): AMMONIA in the last 168 hours. Coagulation Profile: No results for input(s): INR, PROTIME in the last 168 hours. Cardiac Enzymes: No results for input(s): CKTOTAL, CKMB, CKMBINDEX, TROPONINI in the last 168 hours. BNP (last 3 results) No results for input(s): PROBNP in the last 8760 hours. HbA1C: No results for input(s):  HGBA1C in the last 72 hours. CBG: No results for input(s): GLUCAP in the last 168 hours. Lipid Profile: No results for input(s): CHOL, HDL, LDLCALC, TRIG, CHOLHDL, LDLDIRECT in the last 72 hours. Thyroid Function Tests: Recent Labs    01/22/19 2115  TSH 0.542   Anemia Panel: No results for input(s): VITAMINB12, FOLATE, FERRITIN, TIBC, IRON, RETICCTPCT in the last 72 hours. Sepsis Labs: No results for input(s): PROCALCITON, LATICACIDVEN in the last 168 hours.  Recent Results (from the past 240 hour(s))  SARS CORONAVIRUS 2 (TAT 6-24 HRS) Nasopharyngeal Nasopharyngeal Swab     Status: None   Collection Time: 01/22/19  9:41 PM   Specimen: Nasopharyngeal Swab  Result Value Ref Range Status   SARS Coronavirus 2 NEGATIVE NEGATIVE Final    Comment: (NOTE) SARS-CoV-2 target nucleic acids are NOT DETECTED. The SARS-CoV-2 RNA is generally detectable in upper and lower respiratory specimens during the acute phase of infection. Negative results do not preclude SARS-CoV-2 infection, do not rule  out co-infections with other pathogens, and should not be used as the sole basis for treatment or other patient management decisions. Negative results must be combined with clinical observations, patient history, and epidemiological information. The expected result is Negative. Fact Sheet for Patients: SugarRoll.be Fact Sheet for Healthcare Providers: https://www.woods-mathews.com/ This test is not yet approved or cleared by the [place] FDA and  has been authorized for detection and/or diagnosis of SARS-CoV-2 by FDA under an Emergency Use Authorization (EUA). This EUA will remain  in effect (meaning this test can be used) for the duration of the COVID-19 declaration under Section 56 4(b)(1) of the Act, 21 U.S.C. section 360bbb-3(b)(1), unless the authorization is terminated or revoked sooner. Performed at [facility], [place] [address],  [place], [place] [address]   MRSA PCR Screening     Status: None   Collection Time: 01/23/19  2:35 AM   Specimen: Nasal Mucosa; Nasopharyngeal  Result Value Ref Range Status   MRSA by PCR NEGATIVE NEGATIVE Final    Comment:        The GeneXpert MRSA Assay (FDA approved for NASAL specimens only), is one component of a comprehensive MRSA colonization surveillance program. It is not intended to diagnose MRSA infection nor to guide or monitor treatment for MRSA infections. Performed at [facility], [place] [address], [place], [place] [address]      Radiology Studies: CT LUMBAR SPINE WO CONTRAST  Result Date: 01/22/2019 CLINICAL DATA:  Initial evaluation for acute trauma, fall. EXAM: CT LUMBAR SPINE WITHOUT CONTRAST TECHNIQUE: Multidetector CT imaging of the lumbar spine was performed without intravenous contrast administration. Multiplanar CT image reconstructions were also generated. COMPARISON:  None available. FINDINGS: Segmentation: Standard. Lowest well-formed disc space labeled the L5-S1 level. Alignment: 5 mm anterolisthesis of L4 on L5, chronic and facet mediated. Alignment otherwise normal with preservation of the normal lumbar lordosis. Vertebrae: There is an acute compression fracture involving the superior endplate of L2 with up to 40% height loss and trace 2 mm bony retropulsion. No significant stenosis. This is benign/mechanical in appearance. Otherwise, vertebral body height maintained. No other acute or chronic fracture. Visualized sacrum and pelvis intact. No discrete or worrisome osseous lesions. Paraspinal and other soft tissues: Minimal paraspinal edema adjacent to the L2 compression fracture. Paraspinous soft tissues demonstrate no other acute finding. Extensive aorto bi-iliac atherosclerotic disease noted. Colonic diverticulosis noted. Disc levels: L1-2: Trace 2 mm bony retropulsion related to the L2 compression fracture. Moderate bilateral facet hypertrophy. No  significant stenosis. L2-3: Mild disc bulge. Moderate bilateral facet hypertrophy. No significant spinal stenosis. Foramina appear patent. L3-4: Mild annular disc bulge. Moderate facet and ligament flavum hypertrophy. No significant spinal stenosis. Foramina appear patent. L4-5: Chronic 5 mm anterolisthesis. Associated broad posterior pseudo disc bulge/uncovering, asymmetric to the right. Severe bilateral facet degeneration. Resultant mild-to-moderate canal and bilateral lateral recess stenosis. Mild bilateral L4 foraminal narrowing. L5-S1: Negative interspace. Advanced bilateral facet degeneration. No significant spinal stenosis. Mild left L5 foraminal narrowing. IMPRESSION: 1. Acute compression fracture involving the superior endplate of L2 with up to 40% height loss and trace 2 mm bony retropulsion. No significant stenosis. 2. No other acute traumatic injury within the lumbar spine. 3. 5 mm anterolisthesis of L4 on L5, chronic and facet mediated. Advanced bilateral facet degeneration and mild disc bulging at this level resultant mild-to-moderate canal and bilateral lateral recess stenosis. 4. Colonic diverticulosis. 5.  Aortic Atherosclerosis (ICD10-I70.0). Electronically Signed   By: Jeannine Boga M.D.   On:  01/22/2019 20:31   CT PELVIS WO CONTRAST  Result Date: 01/22/2019 CLINICAL DATA:  Low back and right hip pain after fall on Saturday. EXAM: CT PELVIS WITHOUT CONTRAST TECHNIQUE: Multidetector CT imaging of the pelvis was performed following the standard protocol without intravenous contrast. COMPARISON:  Right hip x-rays from same day. FINDINGS: Urinary Tract:  Left posterolateral bladder diverticulum. Bowel:  Sigmoid diverticulosis. Vascular/Lymphatic: Aortoiliac atherosclerotic vascular disease. No enlarged pelvic lymph nodes. Reproductive:  Prior hysterectomy.  No adnexal mass. Other:  None. Musculoskeletal: No acute fracture or dislocation. Prior bilateral total hip arthroplasties. No hardware  complication. Severe facet arthropathy at L4-L5 and L5-S1 with grade 1 anterolisthesis at L4-L5. 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 IMPRESSION: 1. No acute osseous abnormality. 2. Prior bilateral total hip arthroplasties without hardware complication. Electronically Signed   By: Titus Dubin M.D.   On: 01/22/2019 20:28   DG Abd Acute W/Chest  Result Date: 01/22/2019 CLINICAL DATA:  Constipation EXAM: DG ABDOMEN ACUTE W/ 1V CHEST COMPARISON:  01/22/2019 FINDINGS: Nonobstructive bowel gas pattern. Moderate stool throughout the colon. No organomegaly or free air. No suspicious calcification. Bilateral hip replacements. No acute bony abnormality. IMPRESSION: Moderate stool burden.  No acute findings. Electronically Signed   By: Rolm Baptise M.D.   On: 01/22/2019 18:08   DG Hip Unilat With Pelvis 2-3 Views Right  Result Date: 01/22/2019 CLINICAL DATA:  Right-sided hip pain EXAM: DG HIP (WITH OR WITHOUT PELVIS) 2-3V RIGHT COMPARISON:  12/23/2014 FINDINGS: SI joints are non widened. Pubic symphysis is intact. Status post bilateral hip replacements with normal alignment. Multiple calcifications about the right hip are chronic. IMPRESSION: Status post bilateral hip replacements. No definite acute osseous abnormality. Electronically Signed   By: Donavan Foil M.D.   On: 01/22/2019 17:58    Scheduled Meds: . Chlorhexidine Gluconate Cloth  6 each Topical Daily  . pantoprazole  40 mg Oral BID  . Rivaroxaban  15 mg Oral Q supper  . sodium chloride flush  3 mL Intravenous Q12H   Continuous Infusions: . sodium chloride    . diltiazem (CARDIZEM) infusion 5 mg/hr (01/24/19 1342)     LOS: 1 day   Marylu Lund, MD [facility] Pager On Amion  If 7PM-7AM, please contact night-coverage 01/24/2019, 2:13 PM

## 2019-01-25 LAB — MAGNESIUM: Magnesium: 1.6 mg/dL — ABNORMAL LOW (ref 1.7–2.4)

## 2019-01-25 LAB — BASIC METABOLIC PANEL
Anion gap: 10 (ref 5–15)
BUN: 27 mg/dL — ABNORMAL HIGH (ref 8–23)
CO2: 22 mmol/L (ref 22–32)
Calcium: 8.2 mg/dL — ABNORMAL LOW (ref 8.9–10.3)
Chloride: 105 mmol/L (ref 98–111)
Creatinine, Ser: 0.72 mg/dL (ref 0.44–1.00)
GFR calc Af Amer: >60 mL/min (ref >60)
GFR calc non Af Amer: >60 mL/min (ref >60)
Glucose, Bld: 115 mg/dL — ABNORMAL HIGH (ref 70–99)
Potassium: 3.9 mmol/L (ref 3.5–5.1)
Sodium: 137 mmol/L (ref 135–145)

## 2019-01-25 MED ORDER — MAGNESIUM CITRATE PO SOLN
1.0000 | Freq: Once | ORAL | Status: AC
  Administered 2019-01-25: 13:00:00 1 via ORAL
  Filled 2019-01-25: qty 296

## 2019-01-25 MED ORDER — DILTIAZEM HCL 30 MG PO TABS
30.0000 mg | ORAL_TABLET | Freq: Three times a day (TID) | ORAL | Status: DC
  Administered 2019-01-25 – 2019-01-29 (×13): 30 mg via ORAL
  Filled 2019-01-25 (×13): qty 1

## 2019-01-25 MED ORDER — BISACODYL 10 MG RE SUPP
10.0000 mg | Freq: Once | RECTAL | Status: AC
  Administered 2019-01-25: 10 mg via RECTAL
  Filled 2019-01-25: qty 1

## 2019-01-25 NOTE — Evaluation (Signed)
Physical Therapy Evaluation Patient Details Name: Virginia Bullock MRN: [id number] DOB: 1928-06-03 Today's Date: 01/25/2019   History of Present Illness  86 y.o. female with medical history significant for pulmonary fibrosis, history of hypothyroidism, and history of bilateral hip replacements, now presenting to the emergency department with severe right hip pain after a mechanical fall several days ago.  Patient reports that she had been in her usual state of health when she had a ground-level mechanical fall at home on 01/18/2019, landing on her right hip.  Pt admitted for new onset afib with RVR and L2 compression fracture  Clinical Impression  Pt admitted with above diagnosis.  Pt currently with functional limitations due to the deficits listed below (see PT Problem List). Pt will benefit from skilled PT to increase their independence and safety with mobility to allow discharge to the venue listed below.  Pt reports she is usually very careful not to fall.  Reviewed back precautions for comfort with pt however she states she has already "looked them up."  Pt assisted to/from bathroom and slow to mobilize due to pain as well as dyspnea.  Pt's HR up to 136 bpm and SPO2 dropped to 82% on room air with activity.  Pt plans to return home however would benefit from HHPT and supervision initially for mobility for safety upon d/c.     Follow Up Recommendations Home health PT;Supervision for mobility/OOB    Equipment Recommendations  Rolling walker with 5" wheels    Recommendations for Other Services       Precautions / Restrictions Precautions Precautions: Fall;Back Precaution Comments: monitor HR and O2, back for comfort with L2 comp fx      Mobility  Bed Mobility Overal bed mobility: Needs Assistance             General bed mobility comments: pt sitting EOB on arrival  Transfers Overall transfer level: Needs assistance Equipment used: 2 person hand held assist Transfers: Sit  to/from Stand Sit to Stand: Min guard         General transfer comment: pt with slow transitions, prefers to hold hands and not use RW (states she furniture walks at home)  Ambulation/Gait Ambulation/Gait assistance: Min assist;+2 safety/equipment Gait Distance (Feet): 8 Feet(x2) Assistive device: 2 person hand held assist Gait Pattern/deviations: Decreased stride length;Step-to pattern;Antalgic Gait velocity: decr   General Gait Details: leads with L LE due to ankle hx, slow pace and required 3 standing rest breaks upon returning to bed (ambulate to/from bathroom only), HR 95-136 bpm and SPO2 dropped to 82% on room air with activity (RN notified), HR 96 and SPO2 95% on room air upon leaving room  Stairs            Wheelchair Mobility    Modified Rankin (Stroke Patients Only)       Balance Overall balance assessment: History of Falls                                           Pertinent Vitals/Pain Pain Assessment: Faces Faces Pain Scale: Hurts little more Pain Location: right buttocks/hip Pain Descriptors / Indicators: Discomfort;Sore Pain Intervention(s): Repositioned;Monitored during session    Home Living Family/patient expects to be discharged to:: Private residence Living Arrangements: Alone Available Help at Discharge: Family;Available PRN/intermittently Type of Home: House       Home Layout: Two level Home Equipment: Walker - 2  wheels Additional Comments: pt reports taking one step at a time and going up/down stairs at home slowly, also takes seated rest breaks as needed    Prior Function Level of Independence: Independent         Comments: reports furniture walking     Hand Dominance        Extremity/Trunk Assessment        Lower Extremity Assessment Lower Extremity Assessment: Generalized weakness;LLE deficits/detail LLE Deficits / Details: reports L ankle injury hx and reports she is careful at baseline due to  weakness/instability of ankle       Communication   Communication: No difficulties  Cognition Arousal/Alertness: Awake/alert Behavior During Therapy: WFL for tasks assessed/performed Overall Cognitive Status: Within Functional Limits for tasks assessed                                        General Comments      Exercises     Assessment/Plan    PT Assessment Patient needs continued PT services  PT Problem List Decreased balance;Decreased strength;Decreased mobility;Decreased activity tolerance;Decreased knowledge of use of DME;Pain;Decreased knowledge of precautions;Cardiopulmonary status limiting activity       PT Treatment Interventions DME instruction;Therapeutic activities;Gait training;Therapeutic exercise;Patient/family education;Functional mobility training;Balance training;Stair training    PT Goals (Current goals can be found in the Care Plan section)  Acute Rehab PT Goals PT Goal Formulation: With patient Time For Goal Achievement: 02/08/19 Potential to Achieve Goals: Good    Frequency Min 3X/week   Barriers to discharge        Co-evaluation               AM-PAC PT "6 Clicks" Mobility  Outcome Measure Help needed turning from your back to your side while in a flat bed without using bedrails?: A Little Help needed moving from lying on your back to sitting on the side of a flat bed without using bedrails?: A Little Help needed moving to and from a bed to a chair (including a wheelchair)?: A Little Help needed standing up from a chair using your arms (e.g., wheelchair or bedside chair)?: A Little Help needed to walk in hospital room?: A Little Help needed climbing 3-5 steps with a railing? : A Lot 6 Click Score: 17    End of Session   Activity Tolerance: Patient tolerated treatment well Patient left: in bed;with call bell/phone within reach;with bed alarm set Nurse Communication: Mobility status PT Visit Diagnosis: Other  abnormalities of gait and mobility (R26.89)    Time: [id number] PT Time Calculation (min) (ACUTE ONLY): 32 min   Charges:   PT Evaluation $PT Eval Low Complexity: 1 Low        Kati PT, DPT Acute Rehabilitation Services Office: [phone]  Trena Platt 01/25/2019, 11:37 AM

## 2019-01-25 NOTE — Progress Notes (Signed)
Progress Note  Patient Name: Virginia Bullock Date of Encounter: 01/25/2019  Primary Cardiologist: Dr. Margaretann Loveless, MD   Subjective   Constipated wanted suppository   Inpatient Medications    Scheduled Meds: . bisacodyl  10 mg Rectal Once  . Chlorhexidine Gluconate Cloth  6 each Topical Daily  . magnesium citrate  1 Bottle Oral Once  . pantoprazole  40 mg Oral BID  . Rivaroxaban  15 mg Oral Q supper  . sodium chloride flush  3 mL Intravenous Q12H   Continuous Infusions: . sodium chloride    . diltiazem (CARDIZEM) infusion 5 mg/hr (01/25/19 0722)   PRN Meds: sodium chloride, acetaminophen, HYDROcodone-acetaminophen, HYDROmorphone (DILAUDID) injection, ondansetron (ZOFRAN) IV, polyethylene glycol, sodium chloride flush   Vital Signs    Vitals:   01/25/19 0500 01/25/19 0700 01/25/19 0800 01/25/19 0846  BP: 111/62 114/69 124/67   Pulse: 75 69 69   Resp: 19 17 (!) 23   Temp:    97.7 F (36.5 C)  TempSrc:    Oral  SpO2: 95% 94% 95%   Weight:      Height:        Intake/Output Summary (Last 24 hours) at 01/25/2019 0939 Last data filed at 01/25/2019 0800 Gross per 24 hour  Intake 153.4 ml  Output --  Net 153.4 ml   Filed Weights   01/23/19 0237 01/24/19 0522 01/25/19 0423  Weight: 57.1 kg 56.9 kg 57.3 kg    Physical Exam   Affect appropriate Looks younger than stated age  86: normal Neck supple with no adenopathy JVP normal no bruits no thyromegaly Lungs basilar crackles  no wheezing and good diaphragmatic motion Heart:  S1/S2 MR  murmur, no rub, gallop or click PMI normal Abdomen: benighn, BS positve, no tenderness, no AAA no bruit.  No HSM or HJR Distal pulses intact with no bruits No edema Neuro non-focal Skin warm and dry No muscular weakness   Labs    Chemistry Recent Labs  Lab 01/23/19 0521 01/24/19 0202 01/25/19 0126  NA 136 136 137  K 3.9 4.0 3.9  CL 106 106 105  CO2 23 22 22   GLUCOSE 102* 115* 115*  BUN 18 24* 27*  CREATININE  0.52 0.73 0.72  CALCIUM 8.1* 8.4* 8.2*  PROT 6.0*  --   --   ALBUMIN 3.2*  --   --   AST 20  --   --   ALT 14  --   --   ALKPHOS 51  --   --   BILITOT 0.9  --   --   GFRNONAA >60 >60 >60  GFRAA >60 >60 >60  ANIONGAP 7 8 10      Hematology Recent Labs  Lab 01/22/19 2115 01/23/19 0521 01/24/19 0202  WBC 12.1* 11.4* 9.8  RBC 3.59* 3.36* 3.19*  HGB 11.7* 11.1* 10.2*  HCT 35.8* 34.5* 32.4*  MCV 99.7 102.7* 101.6*  MCH 32.6 33.0 32.0  MCHC 32.7 32.2 31.5  RDW 12.9 12.8 12.8  PLT 209 192 207     Radiology    No results found. Telemetry    NSR 01/25/2019   ECG    No new tracing as of 01/24/19- Personally Reviewed  Cardiac Studies   Echocardiogram 06/24/2018:  1. The left ventricle has normal systolic function, with an ejection fraction of 55-60%. The cavity size was normal. Left ventricular diastolic parameters were normal. 2. The right ventricle has normal systolic function. The cavity was normal. There is no increase  in right ventricular wall thickness. 3. Left atrial size was mildly dilated. 4. The mitral valve is degenerative. Moderate thickening of the mitral valve leaflet. Moderate calcification of the mitral valve leaflet. There is moderate mitral annular calcification present. Mitral valve regurgitation is moderate by color flow  Doppler. 5. Tricuspid valve regurgitation is mild-moderate. 6. The aortic valve is tricuspid. Moderate thickening of the aortic valve. Sclerosis without any evidence of stenosis of the aortic valve. Aortic valve regurgitation is mild by color flow Doppler.  Patient Profile     86 y.o. female with a hx of pulmonary fibrosis, moderate mitral and aortic valve regurgitation, hypothyroidism, and bilateral hip replacements who is being seen today for the evaluation of atrial fibrillation with RVR at the request of Dr. Myna Hidalgo.  Assessment & Plan    1.  New onset atrial fibrillation with RVR: in setting of fall and L2 compression  fracture change to PO cardizem today CHADVASC 2 on xarelto   2.  L2 compression fracture: post bilateral THR looked ok on xray but pain from L2 compression fracture needs Rx for constipation with pain meds  3.  History of pulmonary fibrosis: sats ok on RA no CXR done this admission CT 08/20/18 consistent with UIP    Jenkins Rouge MD [facility]

## 2019-01-25 NOTE — Progress Notes (Signed)
PROGRESS NOTE    Virginia Bullock  [id number] DOB: [birth year] DOA: 01/22/2019 PCP: Harlan Stains, MD    Brief Narrative:  Per Dr. Myna Hidalgo: 86 y.o. female with medical history significant for pulmonary fibrosis, history of hypothyroidism, and history of bilateral hip replacements, now presenting to the emergency department with severe right hip pain after a mechanical fall several days ago.  Patient reports that she had been in her usual state of health when she had a ground-level mechanical fall at home on 01/18/2019, landing on her right hip.  She has been able to bear weight since then, but has had severe pain at the posterior aspect of her right hip/buttock region.  She denies any history of palpitations, reports occasional fleeting episodes of chest discomfort though that occur less than monthly and usually while she is lying in bed.  She follows with pulmonology for her pulmonary fibrosis, had been on low-dose prednisone, but has stopped this 1 week ago.  She denies any recent worsening in her shortness of breath and denies any fevers or chills.  She denies any history of bleeding problems but reports developing a rash when she was started on Coumadin after her hip replacements.  ED Course: Upon arrival to the ED, patient is found to be afebrile, saturating mid 90s on room air, slightly tachypneic, tachycardic to 149, and with stable blood pressure.  EKG features atrial fibrillation with RVR, rate 138.  KUB and chest x-ray with moderate stool burden but otherwise no acute findings.  Radiographs of the hips without any definite acute osseous abnormality and CT of the pelvis is also negative.  CT lumbar spine concerning for acute compression fracture involving the superior endplate of L2 with trace bony retropulsion but without significant stenosis.  Chemistry panel is unremarkable and CBC notable for mild normocytic anemia and leukocytosis.  High-sensitivity troponin was normal.  Cardiology was  consulted by the ED physician, indicated that they would see the patient in the morning, and recommended medical admission.  Patient was started on diltiazem infusion, given IV Dilaudid, and hospitalist consulted for admission.  Assessment & Plan:   Principal Problem:   Atrial fibrillation with RVR (HCC) Active Problems:   Closed compression fracture of L2 lumbar vertebra, initial encounter ([place])   Pulmonary fibrosis ([place])   1. New-onset atrial fibrillation with RVR  - Presents with right hip pain since a fall on 01/18/19, noted to have HR in 140's and EKG confirms atrial fibrillation with RVR  - No anginal complaints to suggest an ischemic etiology and HS troponin was normal  - She had echo in June 2020 with EF 55-60%, mild LAE, moderate MR, mild-moderate TR, and mild AI  - CHADS-VASc at least 3 noted - She was started on diltiazem infusion in ED -Cardiology following.  HR stable. Cardizem has been transitioned to PO, thus far receiving first dose. HR stable. Will plan to tx to tele floor if HR remains stable on PO cardizem  2. L2 compression fracture   - Presents with right hip pain after a fall on 01/18/19, has no acute osseous abnormality on CT pelvis, but is noted to have acute-appearing L2 compression fracture  - Continue with analgesics as needed -Physical therapy following, rec for home health PT  3. Pulmonary fibrosis  - Stable, stopped low-dose prednisone one week prior to admit -Stable at this time  4. Constipation -Thus far, no result with dulcolax suppository -have ordered trial of mg citrate  DVT prophylaxis: Xarelto Code Status: DNR  Family Communication: Patient in room, family not at bedside Disposition Plan: Uncertain at this time  Consultants:   Cardiology  Procedures:     Antimicrobials: Anti-infectives (From admission, onward)   None      Subjective: Complaining of feeling constipated this AM  Objective: Vitals:   01/25/19 0800 01/25/19  0846 01/25/19 0900 01/25/19 1251  BP: 124/67  114/65   Pulse: 69  66   Resp: (!) 23  16   Temp:  97.7 F (36.5 C)  98.3 F (36.8 C)  TempSrc:  Oral  Oral  SpO2: 95%  94%   Weight:      Height:        Intake/Output Summary (Last 24 hours) at 01/25/2019 1352 Last data filed at 01/25/2019 1100 Gross per 24 hour  Intake 131.72 ml  Output --  Net 131.72 ml   Filed Weights   01/23/19 0237 01/24/19 0522 01/25/19 0423  Weight: 57.1 kg 56.9 kg 57.3 kg    Examination: General exam: Conversant, in no acute distress Respiratory system: normal chest rise, clear, no audible wheezing Cardiovascular system: regular rhythm, s1-s2 Gastrointestinal system: Nondistended, nontender, pos BS Central nervous system: No seizures, no tremors Extremities: No cyanosis, no joint deformities Skin: No rashes, no pallor Psychiatry: Affect normal // no auditory hallucinations   Data Reviewed: I have personally reviewed following labs and imaging studies  CBC: Recent Labs  Lab 01/22/19 2115 01/23/19 0521 01/24/19 0202  WBC 12.1* 11.4* 9.8  NEUTROABS 7.5  --   --   HGB 11.7* 11.1* 10.2*  HCT 35.8* 34.5* 32.4*  MCV 99.7 102.7* 101.6*  PLT 209 192 [id number]   Basic Metabolic Panel: Recent Labs  Lab 01/22/19 2115 01/23/19 0521 01/24/19 0202 01/25/19 0126  NA 139 136 136 137  K 4.0 3.9 4.0 3.9  CL 108 106 106 105  CO2 23 23 22 22   GLUCOSE 86 102* 115* 115*  BUN 20 18 24* 27*  CREATININE 0.58 0.52 0.73 0.72  CALCIUM 8.7* 8.1* 8.4* 8.2*  MG 1.8 1.9  --  1.6*   GFR: Estimated Creatinine Clearance: 38.4 mL/min (by C-G formula based on SCr of 0.72 mg/dL). Liver Function Tests: Recent Labs  Lab 01/23/19 0521  AST 20  ALT 14  ALKPHOS 51  BILITOT 0.9  PROT 6.0*  ALBUMIN 3.2*   No results for input(s): LIPASE, AMYLASE in the last 168 hours. No results for input(s): AMMONIA in the last 168 hours. Coagulation Profile: No results for input(s): INR, PROTIME in the last 168 hours. Cardiac  Enzymes: No results for input(s): CKTOTAL, CKMB, CKMBINDEX, TROPONINI in the last 168 hours. BNP (last 3 results) No results for input(s): PROBNP in the last 8760 hours. HbA1C: No results for input(s): HGBA1C in the last 72 hours. CBG: No results for input(s): GLUCAP in the last 168 hours. Lipid Profile: No results for input(s): CHOL, HDL, LDLCALC, TRIG, CHOLHDL, LDLDIRECT in the last 72 hours. Thyroid Function Tests: Recent Labs    01/22/19 2115  TSH 0.542   Anemia Panel: No results for input(s): VITAMINB12, FOLATE, FERRITIN, TIBC, IRON, RETICCTPCT in the last 72 hours. Sepsis Labs: No results for input(s): PROCALCITON, LATICACIDVEN in the last 168 hours.  Recent Results (from the past 240 hour(s))  SARS CORONAVIRUS 2 (TAT 6-24 HRS) Nasopharyngeal Nasopharyngeal Swab     Status: None   Collection Time: 01/22/19  9:41 PM   Specimen: Nasopharyngeal Swab  Result Value Ref Range Status   SARS Coronavirus 2 NEGATIVE NEGATIVE Final  Comment: (NOTE) SARS-CoV-2 target nucleic acids are NOT DETECTED. The SARS-CoV-2 RNA is generally detectable in upper and lower respiratory specimens during the acute phase of infection. Negative results do not preclude SARS-CoV-2 infection, do not rule out co-infections with other pathogens, and should not be used as the sole basis for treatment or other patient management decisions. Negative results must be combined with clinical observations, patient history, and epidemiological information. The expected result is Negative. Fact Sheet for Patients: SugarRoll.be Fact Sheet for Healthcare Providers: https://www.woods-mathews.com/ This test is not yet approved or cleared by the [place] FDA and  has been authorized for detection and/or diagnosis of SARS-CoV-2 by FDA under an Emergency Use Authorization (EUA). This EUA will remain  in effect (meaning this test can be used) for the duration of  the COVID-19 declaration under Section 56 4(b)(1) of the Act, 21 U.S.C. section 360bbb-3(b)(1), unless the authorization is terminated or revoked sooner. Performed at [facility], [place] [address] [place], [place] [address]   MRSA PCR Screening     Status: None   Collection Time: 01/23/19  2:35 AM   Specimen: Nasal Mucosa; Nasopharyngeal  Result Value Ref Range Status   MRSA by PCR NEGATIVE NEGATIVE Final    Comment:        The GeneXpert MRSA Assay (FDA approved for NASAL specimens only), is one component of a comprehensive MRSA colonization surveillance program. It is not intended to diagnose MRSA infection nor to guide or monitor treatment for MRSA infections. Performed at [facility], [place] [address] [place], [place] [address]      Radiology Studies: No results found.  Scheduled Meds: . Chlorhexidine Gluconate Cloth  6 each Topical Daily  . diltiazem  30 mg Oral Q8H  . pantoprazole  40 mg Oral BID  . Rivaroxaban  15 mg Oral Q supper  . sodium chloride flush  3 mL Intravenous Q12H   Continuous Infusions: . sodium chloride Stopped (01/25/19 1015)     LOS: 2 days   Marylu Lund, MD [facility] Pager On Amion  If 7PM-7AM, please contact night-coverage 01/25/2019, 1:52 PM

## 2019-01-26 ENCOUNTER — Inpatient Hospital Stay (HOSPITAL_COMMUNITY): Payer: PPO

## 2019-01-26 ENCOUNTER — Encounter (HOSPITAL_COMMUNITY): Payer: Self-pay | Admitting: Internal Medicine

## 2019-01-26 LAB — CBC
HCT: 28.8 % — ABNORMAL LOW (ref 36.0–46.0)
Hemoglobin: 9.2 g/dL — ABNORMAL LOW (ref 12.0–15.0)
MCH: 32.6 pg (ref 26.0–34.0)
MCHC: 31.9 g/dL (ref 30.0–36.0)
MCV: 102.1 fL — ABNORMAL HIGH (ref 80.0–100.0)
Platelets: 244 10*3/uL (ref 150–400)
RBC: 2.82 MIL/uL — ABNORMAL LOW (ref 3.87–5.11)
RDW: 13 % (ref 11.5–15.5)
WBC: 10.5 10*3/uL (ref 4.0–10.5)
nRBC: 0 % (ref 0.0–0.2)

## 2019-01-26 LAB — BASIC METABOLIC PANEL
Anion gap: 10 (ref 5–15)
BUN: 31 mg/dL — ABNORMAL HIGH (ref 8–23)
CO2: 24 mmol/L (ref 22–32)
Calcium: 8.7 mg/dL — ABNORMAL LOW (ref 8.9–10.3)
Chloride: 104 mmol/L (ref 98–111)
Creatinine, Ser: 0.57 mg/dL (ref 0.44–1.00)
GFR calc Af Amer: >60 mL/min (ref >60)
GFR calc non Af Amer: >60 mL/min (ref >60)
Glucose, Bld: 114 mg/dL — ABNORMAL HIGH (ref 70–99)
Potassium: 4.3 mmol/L (ref 3.5–5.1)
Sodium: 138 mmol/L (ref 135–145)

## 2019-01-26 MED ORDER — TRIAMCINOLONE ACETONIDE 0.1 % EX CREA
TOPICAL_CREAM | Freq: Two times a day (BID) | CUTANEOUS | Status: DC
  Administered 2019-01-26 – 2019-01-27 (×2): 1 via TOPICAL
  Filled 2019-01-26: qty 15

## 2019-01-26 MED ORDER — MAGNESIUM SULFATE 2 GM/50ML IV SOLN
2.0000 g | Freq: Once | INTRAVENOUS | Status: AC
  Administered 2019-01-26: 16:00:00 2 g via INTRAVENOUS
  Filled 2019-01-26: qty 50

## 2019-01-26 MED ORDER — IOHEXOL 9 MG/ML PO SOLN
500.0000 mL | ORAL | Status: AC
  Administered 2019-01-26 (×2): 500 mL via ORAL

## 2019-01-26 MED ORDER — DIPHENHYDRAMINE-ZINC ACETATE 2-0.1 % EX CREA
TOPICAL_CREAM | Freq: Two times a day (BID) | CUTANEOUS | Status: DC | PRN
  Filled 2019-01-26: qty 28

## 2019-01-26 MED ORDER — IOHEXOL 9 MG/ML PO SOLN
ORAL | Status: AC
  Filled 2019-01-26: qty 1000

## 2019-01-26 MED ORDER — SODIUM CHLORIDE (PF) 0.9 % IJ SOLN
INTRAMUSCULAR | Status: AC
  Filled 2019-01-26: qty 50

## 2019-01-26 MED ORDER — IOHEXOL 300 MG/ML  SOLN
100.0000 mL | Freq: Once | INTRAMUSCULAR | Status: AC | PRN
  Administered 2019-01-26: 23:00:00 100 mL via INTRAVENOUS

## 2019-01-26 NOTE — Progress Notes (Signed)
Off going RN report bloody stools x3 on his shift. Page to MD to discuss plan of care upon arrival to unit. Pt condition stable at this time. ICE pack given for lower back pain

## 2019-01-26 NOTE — Progress Notes (Signed)
Took over patient care, no new changes from prior nurses assessment. Will continue to monitor patient.

## 2019-01-26 NOTE — Progress Notes (Signed)
Progress Note  Patient Name: Virginia Bullock Date of Encounter: 01/26/2019  Primary Cardiologist: Dr. Margaretann Loveless, MD   Subjective   ? Small amount blood in stool with constipation ICE pack for back pain   Inpatient Medications    Scheduled Meds: . Chlorhexidine Gluconate Cloth  6 each Topical Daily  . diltiazem  30 mg Oral Q8H  . pantoprazole  40 mg Oral BID  . Rivaroxaban  15 mg Oral Q supper  . sodium chloride flush  3 mL Intravenous Q12H   Continuous Infusions: . sodium chloride Stopped (01/25/19 1015)   PRN Meds: sodium chloride, acetaminophen, HYDROcodone-acetaminophen, HYDROmorphone (DILAUDID) injection, ondansetron (ZOFRAN) IV, polyethylene glycol, sodium chloride flush   Vital Signs    Vitals:   01/25/19 1855 01/25/19 2129 01/26/19 0338 01/26/19 0446  BP: 118/68 (!) 104/54 123/67   Pulse: 91 92 79   Resp:  20 20   Temp: 98.2 F (36.8 C)  97.6 F (36.4 C)   TempSrc: Oral  Oral   SpO2: 93%  93%   Weight:  56.7 kg  55.8 kg  Height:  5\' 2"  (1.575 m)      Intake/Output Summary (Last 24 hours) at 01/26/2019 1009 Last data filed at 01/26/2019 0600 Gross per 24 hour  Intake 490.94 ml  Output --  Net 490.94 ml   Filed Weights   01/25/19 0423 01/25/19 2129 01/26/19 0446  Weight: 57.3 kg 56.7 kg 55.8 kg    Physical Exam   Affect appropriate Looks younger than stated age  89: normal Neck supple with no adenopathy JVP normal no bruits no thyromegaly Lungs basilar crackles  no wheezing and good diaphragmatic motion Heart:  S1/S2 MR  murmur, no rub, gallop or click PMI normal Abdomen: benighn, BS positve, no tenderness, no AAA no bruit.  No HSM or HJR Distal pulses intact with no bruits No edema Neuro non-focal Skin warm and dry No muscular weakness   Labs    Chemistry Recent Labs  Lab 01/23/19 0521 01/23/19 0521 01/24/19 0202 01/25/19 0126 01/26/19 0508  NA 136   < > 136 137 138  K 3.9   < > 4.0 3.9 4.3  CL 106   < > 106 105 104   CO2 23   < > 22 22 24   GLUCOSE 102*   < > 115* 115* 114*  BUN 18   < > 24* 27* 31*  CREATININE 0.52   < > 0.73 0.72 0.57  CALCIUM 8.1*   < > 8.4* 8.2* 8.7*  PROT 6.0*  --   --   --   --   ALBUMIN 3.2*  --   --   --   --   AST 20  --   --   --   --   ALT 14  --   --   --   --   ALKPHOS 51  --   --   --   --   BILITOT 0.9  --   --   --   --   GFRNONAA >60   < > >60 >60 >60  GFRAA >60   < > >60 >60 >60  ANIONGAP 7   < > 8 10 10    < > = values in this interval not displayed.     Hematology Recent Labs  Lab 01/22/19 2115 01/23/19 0521 01/24/19 0202  WBC 12.1* 11.4* 9.8  RBC 3.59* 3.36* 3.19*  HGB 11.7* 11.1* 10.2*  HCT 35.8*  34.5* 32.4*  MCV 99.7 102.7* 101.6*  MCH 32.6 33.0 32.0  MCHC 32.7 32.2 31.5  RDW 12.9 12.8 12.8  PLT 209 192 207     Radiology    No results found. Telemetry    NSR 01/26/2019 rare PVC   ECG    No new tracing as of 01/24/19- Personally Reviewed  Cardiac Studies   Echocardiogram 06/24/2018:  1. The left ventricle has normal systolic function, with an ejection fraction of 55-60%. The cavity size was normal. Left ventricular diastolic parameters were normal. 2. The right ventricle has normal systolic function. The cavity was normal. There is no increase in right ventricular wall thickness. 3. Left atrial size was mildly dilated. 4. The mitral valve is degenerative. Moderate thickening of the mitral valve leaflet. Moderate calcification of the mitral valve leaflet. There is moderate mitral annular calcification present. Mitral valve regurgitation is moderate by color flow  Doppler. 5. Tricuspid valve regurgitation is mild-moderate. 6. The aortic valve is tricuspid. Moderate thickening of the aortic valve. Sclerosis without any evidence of stenosis of the aortic valve. Aortic valve regurgitation is mild by color flow Doppler.  Patient Profile     86 y.o. female with a hx of pulmonary fibrosis, moderate mitral and aortic valve  regurgitation, hypothyroidism, and bilateral hip replacements who is being seen today for the evaluation of atrial fibrillation with RVR at the request of Dr. Myna Hidalgo.  Assessment & Plan    1.  New onset atrial fibrillation with RVR: in setting of fall and L2 compression fracture on PO cardizem  CHADVASC 2 on xarelto telemetry improved NSR   2.  L2 compression fracture: post bilateral THR looked ok on xray but pain from L2 compression fracture needs Rx for constipation with pain meds   3.  History of pulmonary fibrosis: sats ok on RA no CXR done this admission CT 08/20/18 consistent with UIP  Cardiology will sign off   Jenkins Rouge MD [facility]

## 2019-01-26 NOTE — Progress Notes (Signed)
PROGRESS NOTE    Virginia Bullock  [id number] DOB: 12-22-1928 DOA: 01/22/2019 PCP: Harlan Stains, MD    Brief Narrative:  Per Dr. Myna Hidalgo: 86 y.o. female with medical history significant for pulmonary fibrosis, history of hypothyroidism, and history of bilateral hip replacements, now presenting to the emergency department with severe right hip pain after a mechanical fall several days ago.  Patient reports that she had been in her usual state of health when she had a ground-level mechanical fall at home on 01/18/2019, landing on her right hip.  She has been able to bear weight since then, but has had severe pain at the posterior aspect of her right hip/buttock region.  She denies any history of palpitations, reports occasional fleeting episodes of chest discomfort though that occur less than monthly and usually while she is lying in bed.  She follows with pulmonology for her pulmonary fibrosis, had been on low-dose prednisone, but has stopped this 1 week ago.  She denies any recent worsening in her shortness of breath and denies any fevers or chills.  She denies any history of bleeding problems but reports developing a rash when she was started on Coumadin after her hip replacements.  ED Course: Upon arrival to the ED, patient is found to be afebrile, saturating mid 90s on room air, slightly tachypneic, tachycardic to 149, and with stable blood pressure.  EKG features atrial fibrillation with RVR, rate 138.  KUB and chest x-ray with moderate stool burden but otherwise no acute findings.  Radiographs of the hips without any definite acute osseous abnormality and CT of the pelvis is also negative.  CT lumbar spine concerning for acute compression fracture involving the superior endplate of L2 with trace bony retropulsion but without significant stenosis.  Chemistry panel is unremarkable and CBC notable for mild normocytic anemia and leukocytosis.  High-sensitivity troponin was normal.  Cardiology was  consulted by the ED physician, indicated that they would see the patient in the morning, and recommended medical admission.  Patient was started on diltiazem infusion, given IV Dilaudid, and hospitalist consulted for admission.  Assessment & Plan:   Principal Problem:   Atrial fibrillation with RVR (HCC) Active Problems:   Closed compression fracture of L2 lumbar vertebra, initial encounter ([place])   Pulmonary fibrosis ([place])   1. New-onset atrial fibrillation with RVR  - Presents with right hip pain since a fall on 01/18/19, noted to have HR in 140's and EKG confirms atrial fibrillation with RVR  - No anginal complaints to suggest an ischemic etiology and HS troponin was normal  - She had echo in June 2020 with EF 55-60%, mild LAE, moderate MR, mild-moderate TR, and mild AI  - CHADS-VASc at least 3 noted - She was started on diltiazem infusion in ED -Cardiology following.  HR stable. Cardizem has been transitioned to PO. HR remains stable and Cardiology signed off -anticoagulation on hold given new acute blood loss anemia  2. L2 compression fracture   - Presents with right hip pain after a fall on 01/18/19, has no acute osseous abnormality on CT pelvis, but is noted to have acute-appearing L2 compression fracture  - Continue with analgesics as needed -Physical therapy following, rec for home health PT on d/c  3. Pulmonary fibrosis  - Stable, stopped low-dose prednisone one week prior to admit -Remains stable at this time  4. Constipation -Good results ultimately with Mg citrate -Continue bowel regimen as needed. Resolved  5. Acute blood loss anemia -Red blood noted with  large bowel movements overnight -Ordered and reviewed CBC, noted to have dropped 1gm hgb to 9 from 10 yesterday -Pt is adamant about not wanting endoscopy/colonoscopy -Discussed case with GI who recommends continuing on PPI, ordering CT abd/pelvis with IV/PO contrast to eval for masses -if CT neg and bleeding  resolves, consider resuming anticoagulation at that time  DVT prophylaxis: Xarelto now on hold Code Status: DNR Family Communication: Patient in room, family not at bedside Disposition Plan: Uncertain at this time  Consultants:   Cardiology  Procedures:     Antimicrobials: Anti-infectives (From admission, onward)   None      Subjective: Complaining of blood in stools overnight  Objective: Vitals:   01/26/19 0446 01/26/19 1316 01/26/19 1401 01/26/19 1444  BP:  96/64 114/76 110/63  Pulse:   76   Resp:   16   Temp:   (!) 97.3 F (36.3 C)   TempSrc:   Oral   SpO2:   93%   Weight: 55.8 kg     Height:        Intake/Output Summary (Last 24 hours) at 01/26/2019 1511 Last data filed at 01/26/2019 0600 Gross per 24 hour  Intake 480 ml  Output --  Net 480 ml   Filed Weights   01/25/19 0423 01/25/19 2129 01/26/19 0446  Weight: 57.3 kg 56.7 kg 55.8 kg    Examination: General exam: Awake, laying in bed, in nad Respiratory system: Normal respiratory effort, no wheezing Cardiovascular system: regular rate, s1, s2 Gastrointestinal system: Soft, nondistended, positive BS Central nervous system: CN2-12 grossly intact, strength intact Extremities: Perfused, no clubbing Skin: Normal skin turgor, no notable skin lesions seen Psychiatry: Mood normal // no visual hallucinations   Data Reviewed: I have personally reviewed following labs and imaging studies  CBC: Recent Labs  Lab 01/22/19 2115 01/23/19 0521 01/24/19 0202 01/26/19 1053  WBC 12.1* 11.4* 9.8 10.5  NEUTROABS 7.5  --   --   --   HGB 11.7* 11.1* 10.2* 9.2*  HCT 35.8* 34.5* 32.4* 28.8*  MCV 99.7 102.7* 101.6* 102.1*  PLT 209 192 207 [id number]   Basic Metabolic Panel: Recent Labs  Lab 01/22/19 2115 01/23/19 0521 01/24/19 0202 01/25/19 0126 01/26/19 0508  NA 139 136 136 137 138  K 4.0 3.9 4.0 3.9 4.3  CL 108 106 106 105 104  CO2 23 23 22 22 24   GLUCOSE 86 102* 115* 115* 114*  BUN 20 18 24* 27* 31*   CREATININE 0.58 0.52 0.73 0.72 0.57  CALCIUM 8.7* 8.1* 8.4* 8.2* 8.7*  MG 1.8 1.9  --  1.6*  --    GFR: Estimated Creatinine Clearance: 38.4 mL/min (by C-G formula based on SCr of 0.57 mg/dL). Liver Function Tests: Recent Labs  Lab 01/23/19 0521  AST 20  ALT 14  ALKPHOS 51  BILITOT 0.9  PROT 6.0*  ALBUMIN 3.2*   No results for input(s): LIPASE, AMYLASE in the last 168 hours. No results for input(s): AMMONIA in the last 168 hours. Coagulation Profile: No results for input(s): INR, PROTIME in the last 168 hours. Cardiac Enzymes: No results for input(s): CKTOTAL, CKMB, CKMBINDEX, TROPONINI in the last 168 hours. BNP (last 3 results) No results for input(s): PROBNP in the last 8760 hours. HbA1C: No results for input(s): HGBA1C in the last 72 hours. CBG: No results for input(s): GLUCAP in the last 168 hours. Lipid Profile: No results for input(s): CHOL, HDL, LDLCALC, TRIG, CHOLHDL, LDLDIRECT in the last 72 hours. Thyroid Function Tests: No results  for input(s): TSH, T4TOTAL, FREET4, T3FREE, THYROIDAB in the last 72 hours. Anemia Panel: No results for input(s): VITAMINB12, FOLATE, FERRITIN, TIBC, IRON, RETICCTPCT in the last 72 hours. Sepsis Labs: No results for input(s): PROCALCITON, LATICACIDVEN in the last 168 hours.  Recent Results (from the past 240 hour(s))  SARS CORONAVIRUS 2 (TAT 6-24 HRS) Nasopharyngeal Nasopharyngeal Swab     Status: None   Collection Time: 01/22/19  9:41 PM   Specimen: Nasopharyngeal Swab  Result Value Ref Range Status   SARS Coronavirus 2 NEGATIVE NEGATIVE Final    Comment: (NOTE) SARS-CoV-2 target nucleic acids are NOT DETECTED. The SARS-CoV-2 RNA is generally detectable in upper and lower respiratory specimens during the acute phase of infection. Negative results do not preclude SARS-CoV-2 infection, do not rule out co-infections with other pathogens, and should not be used as the sole basis for treatment or other patient management  decisions. Negative results must be combined with clinical observations, patient history, and epidemiological information. The expected result is Negative. Fact Sheet for Patients: SugarRoll.be Fact Sheet for Healthcare Providers: https://www.woods-mathews.com/ This test is not yet approved or cleared by the [place] FDA and  has been authorized for detection and/or diagnosis of SARS-CoV-2 by FDA under an Emergency Use Authorization (EUA). This EUA will remain  in effect (meaning this test can be used) for the duration of the COVID-19 declaration under Section 56 4(b)(1) of the Act, 21 U.S.C. section 360bbb-3(b)(1), unless the authorization is terminated or revoked sooner. Performed at [facility], [place] [address] [place], [place] [address]   MRSA PCR Screening     Status: None   Collection Time: 01/23/19  2:35 AM   Specimen: Nasal Mucosa; Nasopharyngeal  Result Value Ref Range Status   MRSA by PCR NEGATIVE NEGATIVE Final    Comment:        The GeneXpert MRSA Assay (FDA approved for NASAL specimens only), is one component of a comprehensive MRSA colonization surveillance program. It is not intended to diagnose MRSA infection nor to guide or monitor treatment for MRSA infections. Performed at [facility], [place] [address], [place], [place] [address]      Radiology Studies: No results found.  Scheduled Meds: . Chlorhexidine Gluconate Cloth  6 each Topical Daily  . diltiazem  30 mg Oral Q8H  . pantoprazole  40 mg Oral BID  . Rivaroxaban  15 mg Oral Q supper  . sodium chloride flush  3 mL Intravenous Q12H  . triamcinolone cream   Topical BID   Continuous Infusions: . sodium chloride Stopped (01/25/19 1015)     LOS: 3 days   Marylu Lund, MD [facility] Pager On Amion  If 7PM-7AM, please contact night-coverage 01/26/2019, 3:11 PM

## 2019-01-27 LAB — COMPREHENSIVE METABOLIC PANEL
ALT: 14 U/L (ref 0–44)
AST: 20 U/L (ref 15–41)
Albumin: 3 g/dL — ABNORMAL LOW (ref 3.5–5.0)
Alkaline Phosphatase: 61 U/L (ref 38–126)
Anion gap: 6 (ref 5–15)
BUN: 26 mg/dL — ABNORMAL HIGH (ref 8–23)
CO2: 26 mmol/L (ref 22–32)
Calcium: 8 mg/dL — ABNORMAL LOW (ref 8.9–10.3)
Chloride: 103 mmol/L (ref 98–111)
Creatinine, Ser: 0.63 mg/dL (ref 0.44–1.00)
GFR calc Af Amer: >60 mL/min (ref >60)
GFR calc non Af Amer: >60 mL/min (ref >60)
Glucose, Bld: 96 mg/dL (ref 70–99)
Potassium: 4.2 mmol/L (ref 3.5–5.1)
Sodium: 135 mmol/L (ref 135–145)
Total Bilirubin: 0.8 mg/dL (ref 0.3–1.2)
Total Protein: 5.8 g/dL — ABNORMAL LOW (ref 6.5–8.1)

## 2019-01-27 LAB — TYPE AND SCREEN
ABO/RH(D): O POS
Antibody Screen: NEGATIVE

## 2019-01-27 LAB — CBC
HCT: 24.5 % — ABNORMAL LOW (ref 36.0–46.0)
Hemoglobin: 7.9 g/dL — ABNORMAL LOW (ref 12.0–15.0)
MCH: 33.2 pg (ref 26.0–34.0)
MCHC: 32.2 g/dL (ref 30.0–36.0)
MCV: 102.9 fL — ABNORMAL HIGH (ref 80.0–100.0)
Platelets: 210 10*3/uL (ref 150–400)
RBC: 2.38 MIL/uL — ABNORMAL LOW (ref 3.87–5.11)
RDW: 13 % (ref 11.5–15.5)
WBC: 8.7 10*3/uL (ref 4.0–10.5)
nRBC: 0 % (ref 0.0–0.2)

## 2019-01-27 LAB — HEMOGLOBIN AND HEMATOCRIT, BLOOD
HCT: 25.3 % — ABNORMAL LOW (ref 36.0–46.0)
Hemoglobin: 7.9 g/dL — ABNORMAL LOW (ref 12.0–15.0)

## 2019-01-27 NOTE — TOC Initial Note (Signed)
Transition of Care [facility]) - Initial/Assessment Note    Patient Details  Name: Virginia Bullock MRN: [id number] Date of Birth: 1928/05/05  Transition of Care (TOC) CM/SW Contact:    Joaquin Courts, RN Phone Number: 01/27/2019, 10:23 AM  Clinical Narrative:     CM spoke with patient at bedside. Patient reports she lives at home alone but her two daughters will take turns staying with her after she returns home from the hospital.  CM spoke with patient about [facility] recommendations. Patient declines [facility] services. Reports she has had home health in the past and that had an out of pocket cost which she cannot afford. She reports she is very active at baseline, mows her lawn and has a part time job. Patient plans to perform exercises at home to regain her strength. Patient declines rolling walker.                Expected Discharge Plan: Home/Self Care Barriers to Discharge: Continued Medical Work up   Patient Goals and CMS Choice Patient states their goals for this hospitalization and ongoing recovery are:: to go home CMS Medicare.gov Compare Post Acute Care list provided to:: Patient Choice offered to / list presented to : Patient  Expected Discharge Plan and Services Expected Discharge Plan: Home/Self Care   Discharge Planning Services: CM Consult Post Acute Care Choice: [place] arrangements for the past 2 months: Single Family Home                 DME Arranged: N/A DME Agency: NA       HH Arranged: Patient Refused HH          Prior Living Arrangements/Services Living arrangements for the past 2 months: Single Family Home Lives with:: Self Patient language and need for interpreter reviewed:: Yes Do you feel safe going back to the place where you live?: Yes      Need for Family Participation in Patient Care: Yes (Comment) Care giver support system in place?: Yes (comment)   Criminal Activity/Legal Involvement Pertinent to Current Situation/Hospitalization: No -  Comment as needed  Activities of Daily Living Home Assistive Devices/Equipment: Cane (specify quad or straight) ADL Screening (condition at time of admission) Patient's cognitive ability adequate to safely complete daily activities?: Yes Is the patient deaf or have difficulty hearing?: No Does the patient have difficulty seeing, even when wearing glasses/contacts?: No Does the patient have difficulty concentrating, remembering, or making decisions?: No Patient able to express need for assistance with ADLs?: Yes Does the patient have difficulty dressing or bathing?: No Independently performs ADLs?: Yes (appropriate for developmental age) Does the patient have difficulty walking or climbing stairs?: Yes Weakness of Legs: Both Weakness of Arms/Hands: None  Permission Sought/Granted                  Emotional Assessment Appearance:: Appears stated age Attitude/Demeanor/Rapport: Engaged Affect (typically observed): Accepting Orientation: : Oriented to Self, Oriented to Place, Oriented to  Time, Oriented to Situation   Psych Involvement: No (comment)  Admission diagnosis:  Atrial fibrillation with RVR ([place]) [I48.91] Fall, initial encounter [W19.XXXA] Closed wedge compression fracture of L2 vertebra, initial encounter [facility]) [id number] Patient Active Problem List   Diagnosis Date Noted  . Atrial fibrillation with RVR ([place]) 01/22/2019  . Closed compression fracture of L2 lumbar vertebra, initial encounter ([place]) 01/22/2019  . Pulmonary fibrosis ([place]) 01/22/2019  . Rash and nonspecific skin eruption 02/27/2014  . Loss of hearing 02/27/2014  . Lactose intolerance 02/27/2014  .  Migraine headache 02/27/2014  . Hypothyroidism 02/27/2014  . Osteopenia 02/27/2014  . OA (osteoarthritis) of hip 02/20/2014   PCP:  Harlan Stains, MD Pharmacy:   [place], [place] - [place] [address] [place] [address] Phone: [phone]  Fax: [place] [address] [place] [address] [place] [address] Phone: [phone] Fax: [phone]     Social Determinants of Health ([place]) Interventions    Readmission Risk Interventions No flowsheet data found.

## 2019-01-27 NOTE — Progress Notes (Signed)
Physical Therapy Treatment Patient Details Name: Virginia Bullock MRN: [id number] DOB: Jun 05, 1928 Today's Date: 01/27/2019    History of Present Illness 86 y.o. female with medical history significant for pulmonary fibrosis, history of hypothyroidism, and history of bilateral hip replacements, now presenting to the emergency department with severe right hip pain after a mechanical fall several days ago.  Patient reports that she had been in her usual state of health when she had a ground-level mechanical fall at home on 01/18/2019, landing on her right hip.  Pt admitted for new onset afib with RVR and L2 compression fracture    PT Comments    Progressing slowly with mobility. Pt remains unsteady. Recommend use of RW for safe ambulation but pt declines RW. She has declined HHPT f/u (per pt report, this is due to not being able to afford copay). Offered to practice gait training in hallway and stair negotiation-pt declined and requested to return to bed.  SpO2: 90% on RA at rest, 82% on RA with activity. Pt remains unsteady and at risk for further falls.     Follow Up Recommendations  Home health PT;Supervision for mobility/OOB(pt stated she cannot afford HH copay)     Equipment Recommendations  Rolling walker with 5" wheels    Recommendations for Other Services       Precautions / Restrictions Precautions Precautions: Fall Precaution Comments: monitor HR and O2, back for comfort with L2 comp fx Restrictions Weight Bearing Restrictions: No    Mobility  Bed Mobility Overal bed mobility: Needs Assistance Bed Mobility: Supine to Sit;Sit to Supine     Supine to sit: Min guard;HOB elevated Sit to supine: Min guard;HOB elevated   General bed mobility comments: Increased time.  Transfers Overall transfer level: Needs assistance Equipment used: 1 person hand held assist Transfers: Sit to/from Stand Sit to Stand: Min assist         General transfer comment: Increased time.  Recommended RW use but pt declined  Ambulation/Gait Ambulation/Gait assistance: Min assist Gait Distance (Feet): 15 Feet(x2) Assistive device: 1 person hand held assist(+ furniture walking/"cruising)) Gait Pattern/deviations: Step-through pattern;Decreased stride length     General Gait Details: Slow gati speed. Unsteady. Recommended RW use but pt declined. O2 82% on RA with activity, dyspnea 2/4.   Stairs             Wheelchair Mobility    Modified Rankin (Stroke Patients Only)       Balance Overall balance assessment: History of Falls;Needs assistance           Standing balance-Leahy Scale: Poor                              Cognition Arousal/Alertness: Awake/alert Behavior During Therapy: WFL for tasks assessed/performed Overall Cognitive Status: Within Functional Limits for tasks assessed                                        Exercises      General Comments        Pertinent Vitals/Pain Pain Assessment: Faces Faces Pain Scale: Hurts even more Pain Location: right buttocks/hip Pain Descriptors / Indicators: Aching;Discomfort;Sore Pain Intervention(s): Limited activity within patient's tolerance    Home Living                      Prior Function  PT Goals (current goals can now be found in the care plan section) Progress towards PT goals: Progressing toward goals    Frequency    Min 3X/week      PT Plan Current plan remains appropriate    Co-evaluation              AM-PAC PT "6 Clicks" Mobility   Outcome Measure  Help needed turning from your back to your side while in a flat bed without using bedrails?: A Little Help needed moving from lying on your back to sitting on the side of a flat bed without using bedrails?: A Little Help needed moving to and from a bed to a chair (including a wheelchair)?: A Little Help needed standing up from a chair using your arms (e.g., wheelchair or  bedside chair)?: A Little Help needed to walk in hospital room?: A Little Help needed climbing 3-5 steps with a railing? : A Lot 6 Click Score: 17    End of Session   Activity Tolerance: Patient limited by fatigue;Patient limited by pain Patient left: in bed;with call bell/phone within reach;with bed alarm set   PT Visit Diagnosis: Muscle weakness (generalized) (M62.81);Unsteadiness on feet (R26.81);Difficulty in walking, not elsewhere classified (R26.2);Pain     Time: [id number] PT Time Calculation (min) (ACUTE ONLY): 49 min  Charges:  $Gait Training: 8-22 mins $Therapeutic Activity: 23-37 mins                        Doreatha Massed, PT Acute Rehabilitation

## 2019-01-27 NOTE — Progress Notes (Signed)
PROGRESS NOTE    Virginia Bullock  [id number] DOB: 1928/09/13 DOA: 01/22/2019 PCP: Harlan Stains, MD    Brief Narrative:  Per Dr. Myna Hidalgo: 86 y.o. female with medical history significant for pulmonary fibrosis, history of hypothyroidism, and history of bilateral hip replacements, now presenting to the emergency department with severe right hip pain after a mechanical fall several days ago.  Patient reports that she had been in her usual state of health when she had a ground-level mechanical fall at home on 01/18/2019, landing on her right hip.  She has been able to bear weight since then, but has had severe pain at the posterior aspect of her right hip/buttock region.  She denies any history of palpitations, reports occasional fleeting episodes of chest discomfort though that occur less than monthly and usually while she is lying in bed.  She follows with pulmonology for her pulmonary fibrosis, had been on low-dose prednisone, but has stopped this 1 week ago.  She denies any recent worsening in her shortness of breath and denies any fevers or chills.  She denies any history of bleeding problems but reports developing a rash when she was started on Coumadin after her hip replacements.  ED Course: Upon arrival to the ED, patient is found to be afebrile, saturating mid 90s on room air, slightly tachypneic, tachycardic to 149, and with stable blood pressure.  EKG features atrial fibrillation with RVR, rate 138.  KUB and chest x-ray with moderate stool burden but otherwise no acute findings.  Radiographs of the hips without any definite acute osseous abnormality and CT of the pelvis is also negative.  CT lumbar spine concerning for acute compression fracture involving the superior endplate of L2 with trace bony retropulsion but without significant stenosis.  Chemistry panel is unremarkable and CBC notable for mild normocytic anemia and leukocytosis.  High-sensitivity troponin was normal.  Cardiology was  consulted by the ED physician, indicated that they would see the patient in the morning, and recommended medical admission.  Patient was started on diltiazem infusion, given IV Dilaudid, and hospitalist consulted for admission.  Assessment & Plan:   Principal Problem:   Atrial fibrillation with RVR (HCC) Active Problems:   Closed compression fracture of L2 lumbar vertebra, initial encounter ([place])   Pulmonary fibrosis ([place])   1. New-onset atrial fibrillation with RVR  - Presents with right hip pain since a fall on 01/18/19, noted to have HR in 140's and EKG confirms atrial fibrillation with RVR  - No anginal complaints to suggest an ischemic etiology and HS troponin was normal  - She had echo in June 2020 with EF 55-60%, mild LAE, moderate MR, mild-moderate TR, and mild AI  - CHADS-VASc at least 3 noted - She was started on diltiazem infusion in ED -Cardiology following.  HR stable. Cardizem has been transitioned to PO. HR remains stable and Cardiology signed off -anticoagulation on hold given new acute blood loss anemia. Discussed with Cardiology. Recommendation to continue to hold anticoagulation with decision on anticoagulation to be made as outpt  2. L2 compression fracture   - Presents with right hip pain after a fall on 01/18/19, has no acute osseous abnormality on CT pelvis, but is noted to have acute-appearing L2 compression fracture  - Continue with analgesics as needed -Physical therapy following, rec for home health PT when discharged  3. Pulmonary fibrosis  - Stable, stopped low-dose prednisone one week prior to admit - Remains stable currently  4. Constipation -Good results ultimately with Mg  citrate -Continue bowel regimen as needed. Improved  5. Acute blood loss anemia -Red blood noted with large bowel movements overnight -Pt is adamant about not wanting endoscopy/colonoscopy -Discussed case with GI who recommends continuing on PPI -CT abd/pelvis ordered and  reviewed. Findings of diverticula without inflammation -Per above, cont to hold anticoagulation and have pt f/u with Cardiology to discuss anticoagulation at later date -Hgb today down to 7.9. Repeat hemoglobin this afternoon reviewed, stable at 7.9.. Will repeat CBC in AM  DVT prophylaxis: Xarelto now on hold Code Status: DNR Family Communication: Patient in room, family not at bedside Disposition Plan: Uncertain at this time  Consultants:   Cardiology  Procedures:     Antimicrobials: Anti-infectives (From admission, onward)   None      Subjective: Without complaints this AM  Objective: Vitals:   01/27/19 0500 01/27/19 0540 01/27/19 1350 01/27/19 1442  BP:  105/69 118/61 124/74  Pulse:  67 81   Resp:  19 (!) 22   Temp:  (!) 97.5 F (36.4 C) (!) 97.5 F (36.4 C)   TempSrc:  Oral Oral   SpO2:  95% 93%   Weight: 55.2 kg     Height:        Intake/Output Summary (Last 24 hours) at 01/27/2019 1542 Last data filed at 01/26/2019 2204 Gross per 24 hour  Intake 3 ml  Output --  Net 3 ml   Filed Weights   01/25/19 2129 01/26/19 0446 01/27/19 0500  Weight: 56.7 kg 55.8 kg 55.2 kg    Examination: General exam: Conversant, in no acute distress Respiratory system: normal chest rise, clear, no audible wheezing Cardiovascular system: regular rhythm, s1-s2 Gastrointestinal system: Nondistended, nontender, pos BS Central nervous system: No seizures, no tremors Extremities: No cyanosis, no joint deformities Skin: No rashes, no pallor Psychiatry: Affect normal // no auditory hallucinations   Data Reviewed: I have personally reviewed following labs and imaging studies  CBC: Recent Labs  Lab 01/22/19 2115 01/22/19 2115 01/23/19 0521 01/24/19 0202 01/26/19 1053 01/27/19 0452 01/27/19 1507  WBC 12.1*  --  11.4* 9.8 10.5 8.7  --   NEUTROABS 7.5  --   --   --   --   --   --   HGB 11.7*   < > 11.1* 10.2* 9.2* 7.9* 7.9*  HCT 35.8*   < > 34.5* 32.4* 28.8* 24.5* 25.3*   MCV 99.7  --  102.7* 101.6* 102.1* 102.9*  --   PLT 209  --  192 207 244 210  --    < > = values in this interval not displayed.   Basic Metabolic Panel: Recent Labs  Lab 01/22/19 2115 01/22/19 2115 01/23/19 0521 01/24/19 0202 01/25/19 0126 01/26/19 0508 01/27/19 0452  NA 139   < > 136 136 137 138 135  K 4.0   < > 3.9 4.0 3.9 4.3 4.2  CL 108   < > 106 106 105 104 103  CO2 23   < > 23 22 22 24 26   GLUCOSE 86   < > 102* 115* 115* 114* 96  BUN 20   < > 18 24* 27* 31* 26*  CREATININE 0.58   < > 0.52 0.73 0.72 0.57 0.63  CALCIUM 8.7*   < > 8.1* 8.4* 8.2* 8.7* 8.0*  MG 1.8  --  1.9  --  1.6*  --   --    < > = values in this interval not displayed.   GFR: Estimated Creatinine Clearance: 38.4  mL/min (by C-G formula based on SCr of 0.63 mg/dL). Liver Function Tests: Recent Labs  Lab 01/23/19 0521 01/27/19 0452  AST 20 20  ALT 14 14  ALKPHOS 51 61  BILITOT 0.9 0.8  PROT 6.0* 5.8*  ALBUMIN 3.2* 3.0*   No results for input(s): LIPASE, AMYLASE in the last 168 hours. No results for input(s): AMMONIA in the last 168 hours. Coagulation Profile: No results for input(s): INR, PROTIME in the last 168 hours. Cardiac Enzymes: No results for input(s): CKTOTAL, CKMB, CKMBINDEX, TROPONINI in the last 168 hours. BNP (last 3 results) No results for input(s): PROBNP in the last 8760 hours. HbA1C: No results for input(s): HGBA1C in the last 72 hours. CBG: No results for input(s): GLUCAP in the last 168 hours. Lipid Profile: No results for input(s): CHOL, HDL, LDLCALC, TRIG, CHOLHDL, LDLDIRECT in the last 72 hours. Thyroid Function Tests: No results for input(s): TSH, T4TOTAL, FREET4, T3FREE, THYROIDAB in the last 72 hours. Anemia Panel: No results for input(s): VITAMINB12, FOLATE, FERRITIN, TIBC, IRON, RETICCTPCT in the last 72 hours. Sepsis Labs: No results for input(s): PROCALCITON, LATICACIDVEN in the last 168 hours.  Recent Results (from the past 240 hour(s))  SARS CORONAVIRUS  2 (TAT 6-24 HRS) Nasopharyngeal Nasopharyngeal Swab     Status: None   Collection Time: 01/22/19  9:41 PM   Specimen: Nasopharyngeal Swab  Result Value Ref Range Status   SARS Coronavirus 2 NEGATIVE NEGATIVE Final    Comment: (NOTE) SARS-CoV-2 target nucleic acids are NOT DETECTED. The SARS-CoV-2 RNA is generally detectable in upper and lower respiratory specimens during the acute phase of infection. Negative results do not preclude SARS-CoV-2 infection, do not rule out co-infections with other pathogens, and should not be used as the sole basis for treatment or other patient management decisions. Negative results must be combined with clinical observations, patient history, and epidemiological information. The expected result is Negative. Fact Sheet for Patients: SugarRoll.be Fact Sheet for Healthcare Providers: https://www.woods-mathews.com/ This test is not yet approved or cleared by the [place] FDA and  has been authorized for detection and/or diagnosis of SARS-CoV-2 by FDA under an Emergency Use Authorization (EUA). This EUA will remain  in effect (meaning this test can be used) for the duration of the COVID-19 declaration under Section 56 4(b)(1) of the Act, 21 U.S.C. section 360bbb-3(b)(1), unless the authorization is terminated or revoked sooner. Performed at [facility], [place] [address] [place], [place] [address]   MRSA PCR Screening     Status: None   Collection Time: 01/23/19  2:35 AM   Specimen: Nasal Mucosa; Nasopharyngeal  Result Value Ref Range Status   MRSA by PCR NEGATIVE NEGATIVE Final    Comment:        The GeneXpert MRSA Assay (FDA approved for NASAL specimens only), is one component of a comprehensive MRSA colonization surveillance program. It is not intended to diagnose MRSA infection nor to guide or monitor treatment for MRSA infections. Performed at [facility], [place] [address] [place], [place] [address]      Radiology Studies: CT ABDOMEN PELVIS W CONTRAST  Result Date: 01/26/2019 CLINICAL DATA:  Abdominal distension.  Acute anemia.  Bloody stool. EXAM: CT ABDOMEN AND PELVIS WITH CONTRAST TECHNIQUE: Multidetector CT imaging of the abdomen and pelvis was performed using the standard protocol following bolus administration of intravenous contrast. CONTRAST:  165mL OMNIPAQUE IOHEXOL 300 MG/ML  SOLN COMPARISON:  CT pelvis dated January 22, 2019 FINDINGS: Lower chest: Pulmonary fibrosis  is again noted at the lung bases bilaterally.The heart is enlarged. Hepatobiliary: There are few hepatic hypodensities that are favored to represent cysts. Normal gallbladder.There is no biliary ductal dilation. Pancreas: Normal contours without ductal dilatation. No peripancreatic fluid collection. Spleen: No splenic laceration or hematoma. Adrenals/Urinary Tract: --Adrenal glands: No adrenal hemorrhage. --Right kidney/ureter: No hydronephrosis or perinephric hematoma. --Left kidney/ureter: No hydronephrosis or perinephric hematoma. --Urinary bladder: The urinary bladder is poorly evaluated secondary to extensive streak artifact through the patient's pelvis. There is a probable posterior left bladder wall diverticulum. Stomach/Bowel: --Stomach/Duodenum: No hiatal hernia or other gastric abnormality. Normal duodenal course and caliber. --Small bowel: There is evidence for stasis involving several small bowel loops in the right lower quadrant. There is no evidence for small bowel obstruction. --Colon: Rectosigmoid diverticulosis without acute inflammation. There is a moderate amount of stool in the colon. --Appendix: Not visualized. No right lower quadrant inflammation or free fluid. Vascular/Lymphatic: Atherosclerotic changes are noted throughout the abdominal aorta without evidence for an abdominal aortic aneurysm. There is mild to moderate narrowing at the origin of the SMA and celiac axis. --No  retroperitoneal lymphadenopathy. --No mesenteric lymphadenopathy. --the pelvic lymph nodes are poorly evaluated secondary to extensive streak artifact. Reproductive: Not well evaluated on this exam. The patient is likely status post prior hysterectomy. Other: No ascites or free air. The abdominal wall is normal. Musculoskeletal. Again noted is an acute compression fracture of the superior endplate of the L2 vertebral body. Mild degenerative changes are noted throughout the visualized thoracolumbar spine. The patient is status post bilateral total hip arthroplasty. There is extensive streak artifact through the pelvis from these prostheses. IMPRESSION: 1. No acute abnormality detected. 2. Again noted are findings of pulmonary fibrosis at the lung bases. 3. Sigmoid diverticulosis without CT evidence for diverticulitis. 4. Acute L2 compression fracture again noted. 5. Additional chronic findings as detailed above Aortic Atherosclerosis (ICD10-I70.0). Electronically Signed   By: Constance Holster M.D.   On: 01/26/2019 23:11    Scheduled Meds:  diltiazem  30 mg Oral Q8H   pantoprazole  40 mg Oral BID   sodium chloride flush  3 mL Intravenous Q12H   triamcinolone cream   Topical BID   Continuous Infusions:  sodium chloride Stopped (01/25/19 1015)     LOS: 4 days   Marylu Lund, MD [facility] Pager On Amion  If 7PM-7AM, please contact night-coverage 01/27/2019, 3:42 PM

## 2019-01-27 NOTE — Progress Notes (Signed)
Spoke with with Loralyn Freshwater, daughter who asked about patient using the flight of stairs at home to get into the shower. I advised that until we know her hemoglobin has increased, we will probably not allow a shower while in the hospital. She was also informed of patients denial of home health physical therapy and that the patient is declining the walker. Nunzio Cory given an update and informed she would be notified of any changes.

## 2019-01-28 LAB — CBC
HCT: 24 % — ABNORMAL LOW (ref 36.0–46.0)
Hemoglobin: 7.5 g/dL — ABNORMAL LOW (ref 12.0–15.0)
MCH: 32.1 pg (ref 26.0–34.0)
MCHC: 31.3 g/dL (ref 30.0–36.0)
MCV: 102.6 fL — ABNORMAL HIGH (ref 80.0–100.0)
Platelets: 198 10*3/uL (ref 150–400)
RBC: 2.34 MIL/uL — ABNORMAL LOW (ref 3.87–5.11)
RDW: 13 % (ref 11.5–15.5)
WBC: 8.6 10*3/uL (ref 4.0–10.5)
nRBC: 0 % (ref 0.0–0.2)

## 2019-01-28 MED ORDER — LACTULOSE 10 GM/15ML PO SOLN
20.0000 g | ORAL | Status: AC
  Administered 2019-01-28: 13:00:00 20 g via ORAL
  Filled 2019-01-28: qty 30

## 2019-01-28 MED ORDER — LACTULOSE 10 GM/15ML PO SOLN
20.0000 g | ORAL | Status: AC
  Administered 2019-01-28: 17:00:00 20 g via ORAL
  Filled 2019-01-28: qty 30

## 2019-01-28 NOTE — Progress Notes (Signed)
PROGRESS NOTE    Virginia Bullock  [id number] DOB: Apr 11, 1928 DOA: 01/22/2019 PCP: Harlan Stains, MD    Brief Narrative:  Per Dr. Myna Hidalgo: 86 y.o. female with medical history significant for pulmonary fibrosis, history of hypothyroidism, and history of bilateral hip replacements, now presenting to the emergency department with severe right hip pain after a mechanical fall several days ago.  Patient reports that she had been in her usual state of health when she had a ground-level mechanical fall at home on 01/18/2019, landing on her right hip.  She has been able to bear weight since then, but has had severe pain at the posterior aspect of her right hip/buttock region.  She denies any history of palpitations, reports occasional fleeting episodes of chest discomfort though that occur less than monthly and usually while she is lying in bed.  She follows with pulmonology for her pulmonary fibrosis, had been on low-dose prednisone, but has stopped this 1 week ago.  She denies any recent worsening in her shortness of breath and denies any fevers or chills.  She denies any history of bleeding problems but reports developing a rash when she was started on Coumadin after her hip replacements.  ED Course: Upon arrival to the ED, patient is found to be afebrile, saturating mid 90s on room air, slightly tachypneic, tachycardic to 149, and with stable blood pressure.  EKG features atrial fibrillation with RVR, rate 138.  KUB and chest x-ray with moderate stool burden but otherwise no acute findings.  Radiographs of the hips without any definite acute osseous abnormality and CT of the pelvis is also negative.  CT lumbar spine concerning for acute compression fracture involving the superior endplate of L2 with trace bony retropulsion but without significant stenosis.  Chemistry panel is unremarkable and CBC notable for mild normocytic anemia and leukocytosis.  High-sensitivity troponin was normal.  Cardiology was  consulted by the ED physician, indicated that they would see the patient in the morning, and recommended medical admission.  Patient was started on diltiazem infusion, given IV Dilaudid, and hospitalist consulted for admission.  Assessment & Plan:   Principal Problem:   Atrial fibrillation with RVR (HCC) Active Problems:   Closed compression fracture of L2 lumbar vertebra, initial encounter ([place])   Pulmonary fibrosis ([place])   1. New-onset atrial fibrillation with RVR  - Presents with right hip pain since a fall on 01/18/19, noted to have HR in 140's and EKG confirms atrial fibrillation with RVR  - No anginal complaints to suggest an ischemic etiology and HS troponin was normal  - She had echo in June 2020 with EF 55-60%, mild LAE, moderate MR, mild-moderate TR, and mild AI  - CHADS-VASc at least 3 noted - She was started on diltiazem infusion in ED -Cardiology following.  HR stable. Cardizem has been transitioned to PO. HR remains stable and Cardiology signed off -anticoagulation on hold given new acute blood loss anemia. Discussed with Cardiology. Recommendation to continue to hold anticoagulation with decision on anticoagulation regimen to be made as outpt  2. L2 compression fracture   - Presents with right hip pain after a fall on 01/18/19, has no acute osseous abnormality on CT pelvis, but is noted to have acute-appearing L2 compression fracture  - Continue with analgesics as needed -Physical therapy following, rec for home health PT when pt is discharged  3. Pulmonary fibrosis  - Stable, stopped low-dose prednisone one week prior to admit - Remains on minimal O2 support  4. Constipation -  Recently good results ultimately with Mg citrate, however bloody stools were noted, see below -No BM since recent bloody stools. Pt reports feeling constipated. No results with lactulose. Will give another dose of lactulose  5. Acute blood loss anemia -Multiple red blood noted with large bowel  movements on 1/17 -Pt is adamant about not wanting endoscopy/colonoscopy -Discussed case with GI who recommends continuing on PPI -CT abd/pelvis ordered and reviewed. Findings of diverticula without inflammation -Per above, cont to hold anticoagulation and have pt f/u with Cardiology to discuss anticoagulation at later date -Hgb today down to 7.5 and patient reports feeling lightheaded and weak. -No bowel movement since recent heavy GI bleed. Lactulose x2 given today, still awaiting results. Will repeat CBC in AM and if <7, would transfuse.  -Type and screen done on 1/18  DVT prophylaxis: Xarelto now on hold Code Status: DNR Family Communication: Patient in room, family not at bedside Disposition Plan: Home if hgb stabilizes  Consultants:   Cardiology  Discussed case with GI over phone  Procedures:     Antimicrobials: Anti-infectives (From admission, onward)   None      Subjective: Complains of increased weakness, lightheadedness. Constipated with no bm over 3 days  Objective: Vitals:   01/27/19 2149 01/28/19 0500 01/28/19 0520 01/28/19 1310  BP: 103/73  107/81 114/71  Pulse: 81  68 70  Resp: 20  18 18   Temp: 97.7 F (36.5 C)  (!) 97.3 F (36.3 C) 97.9 F (36.6 C)  TempSrc: Oral   Oral  SpO2: 93%  91% 93%  Weight:  56.1 kg    Height:        Intake/Output Summary (Last 24 hours) at 01/28/2019 1706 Last data filed at 01/28/2019 0600 Gross per 24 hour  Intake 0 ml  Output 2 ml  Net -2 ml   Filed Weights   01/26/19 0446 01/27/19 0500 01/28/19 0500  Weight: 55.8 kg 55.2 kg 56.1 kg    Examination: General exam: Awake, laying in bed, in nad Respiratory system: Normal respiratory effort, no wheezing Cardiovascular system: regular rate, s1, s2 Gastrointestinal system: Soft, nondistended, positive BS Central nervous system: CN2-12 grossly intact, strength intact Extremities: Perfused, no clubbing Skin: Normal skin turgor, no notable skin lesions  seen Psychiatry: Mood normal // no visual hallucinations   Data Reviewed: I have personally reviewed following labs and imaging studies  CBC: Recent Labs  Lab 01/22/19 2115 01/22/19 2115 01/23/19 0521 01/23/19 0521 01/24/19 0202 01/26/19 1053 01/27/19 0452 01/27/19 1507 01/28/19 0449  WBC 12.1*   < > 11.4*  --  9.8 10.5 8.7  --  8.6  NEUTROABS 7.5  --   --   --   --   --   --   --   --   HGB 11.7*   < > 11.1*   < > 10.2* 9.2* 7.9* 7.9* 7.5*  HCT 35.8*   < > 34.5*   < > 32.4* 28.8* 24.5* 25.3* 24.0*  MCV 99.7   < > 102.7*  --  101.6* 102.1* 102.9*  --  102.6*  PLT 209   < > 192  --  207 244 210  --  198   < > = values in this interval not displayed.   Basic Metabolic Panel: Recent Labs  Lab 01/22/19 2115 01/22/19 2115 01/23/19 0521 01/24/19 0202 01/25/19 0126 01/26/19 0508 01/27/19 0452  NA 139   < > 136 136 137 138 135  K 4.0   < > 3.9 4.0  3.9 4.3 4.2  CL 108   < > 106 106 105 104 103  CO2 23   < > 23 22 22 24 26   GLUCOSE 86   < > 102* 115* 115* 114* 96  BUN 20   < > 18 24* 27* 31* 26*  CREATININE 0.58   < > 0.52 0.73 0.72 0.57 0.63  CALCIUM 8.7*   < > 8.1* 8.4* 8.2* 8.7* 8.0*  MG 1.8  --  1.9  --  1.6*  --   --    < > = values in this interval not displayed.   GFR: Estimated Creatinine Clearance: 38.4 mL/min (by C-G formula based on SCr of 0.63 mg/dL). Liver Function Tests: Recent Labs  Lab 01/23/19 0521 01/27/19 0452  AST 20 20  ALT 14 14  ALKPHOS 51 61  BILITOT 0.9 0.8  PROT 6.0* 5.8*  ALBUMIN 3.2* 3.0*   No results for input(s): LIPASE, AMYLASE in the last 168 hours. No results for input(s): AMMONIA in the last 168 hours. Coagulation Profile: No results for input(s): INR, PROTIME in the last 168 hours. Cardiac Enzymes: No results for input(s): CKTOTAL, CKMB, CKMBINDEX, TROPONINI in the last 168 hours. BNP (last 3 results) No results for input(s): PROBNP in the last 8760 hours. HbA1C: No results for input(s): HGBA1C in the last 72  hours. CBG: No results for input(s): GLUCAP in the last 168 hours. Lipid Profile: No results for input(s): CHOL, HDL, LDLCALC, TRIG, CHOLHDL, LDLDIRECT in the last 72 hours. Thyroid Function Tests: No results for input(s): TSH, T4TOTAL, FREET4, T3FREE, THYROIDAB in the last 72 hours. Anemia Panel: No results for input(s): VITAMINB12, FOLATE, FERRITIN, TIBC, IRON, RETICCTPCT in the last 72 hours. Sepsis Labs: No results for input(s): PROCALCITON, LATICACIDVEN in the last 168 hours.  Recent Results (from the past 240 hour(s))  SARS CORONAVIRUS 2 (TAT 6-24 HRS) Nasopharyngeal Nasopharyngeal Swab     Status: None   Collection Time: 01/22/19  9:41 PM   Specimen: Nasopharyngeal Swab  Result Value Ref Range Status   SARS Coronavirus 2 NEGATIVE NEGATIVE Final    Comment: (NOTE) SARS-CoV-2 target nucleic acids are NOT DETECTED. The SARS-CoV-2 RNA is generally detectable in upper and lower respiratory specimens during the acute phase of infection. Negative results do not preclude SARS-CoV-2 infection, do not rule out co-infections with other pathogens, and should not be used as the sole basis for treatment or other patient management decisions. Negative results must be combined with clinical observations, patient history, and epidemiological information. The expected result is Negative. Fact Sheet for Patients: SugarRoll.be Fact Sheet for Healthcare Providers: https://www.woods-mathews.com/ This test is not yet approved or cleared by the [place] FDA and  has been authorized for detection and/or diagnosis of SARS-CoV-2 by FDA under an Emergency Use Authorization (EUA). This EUA will remain  in effect (meaning this test can be used) for the duration of the COVID-19 declaration under Section 56 4(b)(1) of the Act, 21 U.S.C. section 360bbb-3(b)(1), unless the authorization is terminated or revoked sooner. Performed at [facility],  [place] [address] [place], [place] [address]   MRSA PCR Screening     Status: None   Collection Time: 01/23/19  2:35 AM   Specimen: Nasal Mucosa; Nasopharyngeal  Result Value Ref Range Status   MRSA by PCR NEGATIVE NEGATIVE Final    Comment:        The GeneXpert MRSA Assay (FDA approved for NASAL specimens only), is one component of a comprehensive MRSA  colonization surveillance program. It is not intended to diagnose MRSA infection nor to guide or monitor treatment for MRSA infections. Performed at [facility], [place] [address], [place], [place] [address]      Radiology Studies: CT ABDOMEN PELVIS W CONTRAST  Result Date: 01/26/2019 CLINICAL DATA:  Abdominal distension.  Acute anemia.  Bloody stool. EXAM: CT ABDOMEN AND PELVIS WITH CONTRAST TECHNIQUE: Multidetector CT imaging of the abdomen and pelvis was performed using the standard protocol following bolus administration of intravenous contrast. CONTRAST:  121mL OMNIPAQUE IOHEXOL 300 MG/ML  SOLN COMPARISON:  CT pelvis dated January 22, 2019 FINDINGS: Lower chest: Pulmonary fibrosis is again noted at the lung bases bilaterally.The heart is enlarged. Hepatobiliary: There are few hepatic hypodensities that are favored to represent cysts. Normal gallbladder.There is no biliary ductal dilation. Pancreas: Normal contours without ductal dilatation. No peripancreatic fluid collection. Spleen: No splenic laceration or hematoma. Adrenals/Urinary Tract: --Adrenal glands: No adrenal hemorrhage. --Right kidney/ureter: No hydronephrosis or perinephric hematoma. --Left kidney/ureter: No hydronephrosis or perinephric hematoma. --Urinary bladder: The urinary bladder is poorly evaluated secondary to extensive streak artifact through the patient's pelvis. There is a probable posterior left bladder wall diverticulum. Stomach/Bowel: --Stomach/Duodenum: No hiatal hernia or other gastric abnormality. Normal duodenal course and caliber. --Small bowel:  There is evidence for stasis involving several small bowel loops in the right lower quadrant. There is no evidence for small bowel obstruction. --Colon: Rectosigmoid diverticulosis without acute inflammation. There is a moderate amount of stool in the colon. --Appendix: Not visualized. No right lower quadrant inflammation or free fluid. Vascular/Lymphatic: Atherosclerotic changes are noted throughout the abdominal aorta without evidence for an abdominal aortic aneurysm. There is mild to moderate narrowing at the origin of the SMA and celiac axis. --No retroperitoneal lymphadenopathy. --No mesenteric lymphadenopathy. --the pelvic lymph nodes are poorly evaluated secondary to extensive streak artifact. Reproductive: Not well evaluated on this exam. The patient is likely status post prior hysterectomy. Other: No ascites or free air. The abdominal wall is normal. Musculoskeletal. Again noted is an acute compression fracture of the superior endplate of the L2 vertebral body. Mild degenerative changes are noted throughout the visualized thoracolumbar spine. The patient is status post bilateral total hip arthroplasty. There is extensive streak artifact through the pelvis from these prostheses. IMPRESSION: 1. No acute abnormality detected. 2. Again noted are findings of pulmonary fibrosis at the lung bases. 3. Sigmoid diverticulosis without CT evidence for diverticulitis. 4. Acute L2 compression fracture again noted. 5. Additional chronic findings as detailed above Aortic Atherosclerosis (ICD10-I70.0). Electronically Signed   By: Constance Holster M.D.   On: 01/26/2019 23:11    Scheduled Meds: . diltiazem  30 mg Oral Q8H  . lactulose  20 g Oral NOW  . pantoprazole  40 mg Oral BID  . sodium chloride flush  3 mL Intravenous Q12H  . triamcinolone cream   Topical BID   Continuous Infusions: . sodium chloride Stopped (01/25/19 1015)     LOS: 5 days   Marylu Lund, MD [facility] Pager On Amion  If  7PM-7AM, please contact night-coverage 01/28/2019, 5:06 PM

## 2019-01-28 NOTE — Care Management Important Message (Signed)
Important Message  Patient Details IM Letter given to Dessa Phi RN Case Manager to present to the Patient Name: Virginia Bullock MRN: [id number] Date of Birth: 05-24-1928   Medicare Important Message Given:  Yes     Kerin Salen 01/28/2019, 1:58 PM

## 2019-01-29 LAB — CBC
HCT: 27.4 % — ABNORMAL LOW (ref 36.0–46.0)
Hemoglobin: 8.5 g/dL — ABNORMAL LOW (ref 12.0–15.0)
MCH: 32.4 pg (ref 26.0–34.0)
MCHC: 31 g/dL (ref 30.0–36.0)
MCV: 104.6 fL — ABNORMAL HIGH (ref 80.0–100.0)
Platelets: 253 10*3/uL (ref 150–400)
RBC: 2.62 MIL/uL — ABNORMAL LOW (ref 3.87–5.11)
RDW: 13 % (ref 11.5–15.5)
WBC: 13.3 10*3/uL — ABNORMAL HIGH (ref 4.0–10.5)
nRBC: 0 % (ref 0.0–0.2)

## 2019-01-29 MED ORDER — HYDROCODONE-ACETAMINOPHEN 5-325 MG PO TABS: 1.0000 | ORAL_TABLET | Freq: Four times a day (QID) | ORAL | 0 refills | Status: AC | PRN

## 2019-01-29 MED ORDER — ONDANSETRON HCL 4 MG PO TABS: 4.0000 mg | ORAL_TABLET | Freq: Three times a day (TID) | ORAL | 1 refills | Status: DC | PRN

## 2019-01-29 MED ORDER — LACTULOSE 10 GM/15ML PO SOLN: 20.0000 g | Freq: Two times a day (BID) | ORAL | 0 refills | Status: DC | PRN

## 2019-01-29 MED ORDER — DILTIAZEM HCL ER COATED BEADS 120 MG PO CP24: 120.0000 mg | ORAL_CAPSULE | Freq: Every day | ORAL | 11 refills | Status: DC

## 2019-01-29 NOTE — TOC Transition Note (Signed)
Transition of Care [facility]) - CM/SW Discharge Note   Patient Details  Name: Virginia Bullock MRN: [id number] Date of Birth: 1928/10/19  [facility]) CM/SW Contact:  Dessa Phi, RN Phone Number: 01/29/2019, 10:25 AM   Clinical Narrative:Patient is A+0x3-declines any HHC-states she can't afford the co pay & she can do the exercises @ home;also states  She has a rw. Asked by nurse to talk to patients children about [place] CM asked patient if she agreed to talking to her children patient stated she can talk to them herself-she did not give CM permission to talk to her children(tech was in rm with patient)Has own transport home. No further CM needs.      Final next level of care: Home/Self Care Barriers to Discharge: No Barriers Identified   Patient Goals and CMS Choice Patient states their goals for this hospitalization and ongoing recovery are:: to go home CMS Medicare.gov Compare Post Acute Care list provided to:: Patient Choice offered to / list presented to : Patient  Discharge Placement                       Discharge Plan and Services   Discharge Planning Services: CM Consult Post Acute Care Choice: Home Health          DME Arranged: N/A DME Agency: NA       HH Arranged: Patient Refused [place]          Social Determinants of Health (SDOH) Interventions     Readmission Risk Interventions No flowsheet data found.

## 2019-01-29 NOTE — Discharge Summary (Signed)
Physician Discharge Summary  Virginia Bullock [id number] DOB: 1928/11/20 DOA: 01/22/2019  PCP: Harlan Stains, MD  Admit date: 01/22/2019 Discharge date: 01/29/2019  Admitted From: Home Disposition: Home with family  Recommendations for Outpatient Follow-up:  1. Follow up with PCP in 1-2 weeks 2. Please obtain BMP/CBC in one week   Home Health: Declined Equipment/Devices: None  Discharge Condition: Stable CODE STATUS: DNR Diet recommendation: Low-salt diet  Discharge summary: 86 year old female with history of pulmonary fibrosis on maintenance prednisone, history of hypothyroidism, history of bilateral hip replacement presented to the emergency department with severe right hip pain after a mechanical fall several days ago.  Patient was on prednisone about 1 week ago.  On arrival to emergency room she was afebrile, saturating 90 on room air, slightly tachypneic, tachycardic to 149 with EKG features of A. fib with RVR.  X-ray and CT scan of the hip was negative.  CT scan of the lumbar spine showed acute compression fracture involving superior endplate of L2 with trace bony retropulsion.  She was admitted to hospital with cardiology consultation.  New onset A. fib with RVR: Treated with Cardizem infusion and converted to oral Cardizem.  Now sinus rhythm.  She was started on Xarelto, started having blood mixed stool and she declined any endoscopy evaluation.  Overall, thought to be not a candidate for therapeutic anticoagulation because of unable to have endoscopic evaluation and chronic anemia.  Patient will have outpatient follow-up with cardiology.  Echo with normal EF.  Blood mixed stool: Patient had 5 days of constipation after fall and pain with less mobility.  He started having bowel movements with laxatives and had multiple bowel movements, had press blood mixed with stool.  Resolved.  Hemoglobin is stable.  Already on PPI twice a day.  GI was consulted but patient declined any  endoscopy procedure.  Xarelto was stopped.  Advised to monitor symptoms at home.  Will need CBC in 1 week.  L2 compression fracture: Conservative management.  Short-term opiates prescribed.  Lactulose prescribed for constipation.  Mobility with support at home.  Patient wanted to go home today to keep up her COVID-19 vaccine appointments.  She does not agree for home health PT OT.  She has her daughter assisting with her at home.  Discussed about different methods to avoid constipation and taking laxatives.  Discharge Diagnoses:  Principal Problem:   Atrial fibrillation with RVR ([place]) Active Problems:   Closed compression fracture of L2 lumbar vertebra, initial encounter ([place])   Pulmonary fibrosis [facility])    Discharge Instructions  Discharge Instructions    Amb referral to [facility]   Complete by: As directed    Call MD for:   Complete by: As directed    Pilar Plate bleeding on your stool   Call MD for:  persistant nausea and vomiting   Complete by: As directed    Call MD for:  severe uncontrolled pain   Complete by: As directed    Diet - low sodium heart healthy   Complete by: As directed    Increase activity slowly   Complete by: As directed      Allergies as of 01/29/2019      Reactions   Benadryl [diphenhydramine Hcl] Anxiety   Panic attack   Codeine Nausea And Vomiting   Coumadin [warfarin Sodium]    Severe rash   Demerol [meperidine] Nausea And Vomiting      Medication List    STOP taking these medications   predniSONE 5 MG tablet  Commonly known as: DELTASONE     TAKE these medications   acetaminophen 325 MG tablet Commonly known as: TYLENOL Take 650 mg by mouth every 6 (six) hours as needed.   Acetylcysteine 600 MG Caps Take 1 capsule (600 mg total) by mouth 2 (two) times daily.   diltiazem 120 MG 24 hr capsule Commonly known as: Cardizem CD Take 1 capsule (120 mg total) by mouth daily.   HYDROcodone-acetaminophen 5-325 MG tablet Commonly known as:  NORCO/VICODIN Take 1 tablet by mouth every 6 (six) hours as needed for up to 5 days for moderate pain.   lactulose 10 GM/15ML solution Commonly known as: CHRONULAC Take 30 mLs (20 g total) by mouth 2 (two) times daily as needed for mild constipation or moderate constipation.   ondansetron 4 MG tablet Commonly known as: Zofran Take 1 tablet (4 mg total) by mouth every 8 (eight) hours as needed for nausea or vomiting.   pantoprazole 40 MG tablet Commonly known as: PROTONIX Take 40 mg by mouth 2 (two) times daily.       Allergies  Allergen Reactions  . Benadryl [Diphenhydramine Hcl] Anxiety    Panic attack  . Codeine Nausea And Vomiting  . Coumadin [Warfarin Sodium]     Severe rash  . Demerol [Meperidine] Nausea And Vomiting    Consultations:  Cardiology   Procedures/Studies: CT LUMBAR SPINE WO CONTRAST  Result Date: 01/22/2019 CLINICAL DATA:  Initial evaluation for acute trauma, fall. EXAM: CT LUMBAR SPINE WITHOUT CONTRAST TECHNIQUE: Multidetector CT imaging of the lumbar spine was performed without intravenous contrast administration. Multiplanar CT image reconstructions were also generated. COMPARISON:  None available. FINDINGS: Segmentation: Standard. Lowest well-formed disc space labeled the L5-S1 level. Alignment: 5 mm anterolisthesis of L4 on L5, chronic and facet mediated. Alignment otherwise normal with preservation of the normal lumbar lordosis. Vertebrae: There is an acute compression fracture involving the superior endplate of L2 with up to 40% height loss and trace 2 mm bony retropulsion. No significant stenosis. This is benign/mechanical in appearance. Otherwise, vertebral body height maintained. No other acute or chronic fracture. Visualized sacrum and pelvis intact. No discrete or worrisome osseous lesions. Paraspinal and other soft tissues: Minimal paraspinal edema adjacent to the L2 compression fracture. Paraspinous soft tissues demonstrate no other acute finding.  Extensive aorto bi-iliac atherosclerotic disease noted. Colonic diverticulosis noted. Disc levels: L1-2: Trace 2 mm bony retropulsion related to the L2 compression fracture. Moderate bilateral facet hypertrophy. No significant stenosis. L2-3: Mild disc bulge. Moderate bilateral facet hypertrophy. No significant spinal stenosis. Foramina appear patent. L3-4: Mild annular disc bulge. Moderate facet and ligament flavum hypertrophy. No significant spinal stenosis. Foramina appear patent. L4-5: Chronic 5 mm anterolisthesis. Associated broad posterior pseudo disc bulge/uncovering, asymmetric to the right. Severe bilateral facet degeneration. Resultant mild-to-moderate canal and bilateral lateral recess stenosis. Mild bilateral L4 foraminal narrowing. L5-S1: Negative interspace. Advanced bilateral facet degeneration. No significant spinal stenosis. Mild left L5 foraminal narrowing. IMPRESSION: 1. Acute compression fracture involving the superior endplate of L2 with up to 40% height loss and trace 2 mm bony retropulsion. No significant stenosis. 2. No other acute traumatic injury within the lumbar spine. 3. 5 mm anterolisthesis of L4 on L5, chronic and facet mediated. Advanced bilateral facet degeneration and mild disc bulging at this level resultant mild-to-moderate canal and bilateral lateral recess stenosis. 4. Colonic diverticulosis. 5.  Aortic Atherosclerosis (ICD10-I70.0). Electronically Signed   By: Jeannine Boga M.D.   On: 01/22/2019 20:31   CT PELVIS WO CONTRAST  Result Date: 01/22/2019 CLINICAL DATA:  Low back and right hip pain after fall on Saturday. EXAM: CT PELVIS WITHOUT CONTRAST TECHNIQUE: Multidetector CT imaging of the pelvis was performed following the standard protocol without intravenous contrast. COMPARISON:  Right hip x-rays from same day. FINDINGS: Urinary Tract:  Left posterolateral bladder diverticulum. Bowel:  Sigmoid diverticulosis. Vascular/Lymphatic: Aortoiliac atherosclerotic  vascular disease. No enlarged pelvic lymph nodes. Reproductive:  Prior hysterectomy.  No adnexal mass. Other:  None. Musculoskeletal: No acute fracture or dislocation. Prior bilateral total hip arthroplasties. No hardware complication. Severe facet arthropathy at L4-L5 and L5-S1 with grade 1 anterolisthesis at L4-L5. 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 IMPRESSION: 1. No acute osseous abnormality. 2. Prior bilateral total hip arthroplasties without hardware complication. Electronically Signed   By: Titus Dubin M.D.   On: 01/22/2019 20:28   CT ABDOMEN PELVIS W CONTRAST  Result Date: 01/26/2019 CLINICAL DATA:  Abdominal distension.  Acute anemia.  Bloody stool. EXAM: CT ABDOMEN AND PELVIS WITH CONTRAST TECHNIQUE: Multidetector CT imaging of the abdomen and pelvis was performed using the standard protocol following bolus administration of intravenous contrast. CONTRAST:  137mL OMNIPAQUE IOHEXOL 300 MG/ML  SOLN COMPARISON:  CT pelvis dated January 22, 2019 FINDINGS: Lower chest: Pulmonary fibrosis is again noted at the lung bases bilaterally.The heart is enlarged. Hepatobiliary: There are few hepatic hypodensities that are favored to represent cysts. Normal gallbladder.There is no biliary ductal dilation. Pancreas: Normal contours without ductal dilatation. No peripancreatic fluid collection. Spleen: No splenic laceration or hematoma. Adrenals/Urinary Tract: --Adrenal glands: No adrenal hemorrhage. --Right kidney/ureter: No hydronephrosis or perinephric hematoma. --Left kidney/ureter: No hydronephrosis or perinephric hematoma. --Urinary bladder: The urinary bladder is poorly evaluated secondary to extensive streak artifact through the patient's pelvis. There is a probable posterior left bladder wall diverticulum. Stomach/Bowel: --Stomach/Duodenum: No hiatal hernia or other gastric abnormality. Normal duodenal course and caliber. --Small bowel: There is evidence for stasis involving  several small bowel loops in the right lower quadrant. There is no evidence for small bowel obstruction. --Colon: Rectosigmoid diverticulosis without acute inflammation. There is a moderate amount of stool in the colon. --Appendix: Not visualized. No right lower quadrant inflammation or free fluid. Vascular/Lymphatic: Atherosclerotic changes are noted throughout the abdominal aorta without evidence for an abdominal aortic aneurysm. There is mild to moderate narrowing at the origin of the SMA and celiac axis. --No retroperitoneal lymphadenopathy. --No mesenteric lymphadenopathy. --the pelvic lymph nodes are poorly evaluated secondary to extensive streak artifact. Reproductive: Not well evaluated on this exam. The patient is likely status post prior hysterectomy. Other: No ascites or free air. The abdominal wall is normal. Musculoskeletal. Again noted is an acute compression fracture of the superior endplate of the L2 vertebral body. Mild degenerative changes are noted throughout the visualized thoracolumbar spine. The patient is status post bilateral total hip arthroplasty. There is extensive streak artifact through the pelvis from these prostheses. IMPRESSION: 1. No acute abnormality detected. 2. Again noted are findings of pulmonary fibrosis at the lung bases. 3. Sigmoid diverticulosis without CT evidence for diverticulitis. 4. Acute L2 compression fracture again noted. 5. Additional chronic findings as detailed above Aortic Atherosclerosis (ICD10-I70.0). Electronically Signed   By: Constance Holster M.D.   On: 01/26/2019 23:11   DG Abd Acute W/Chest  Result Date: 01/22/2019 CLINICAL DATA:  Constipation EXAM: DG ABDOMEN ACUTE W/ 1V CHEST COMPARISON:  01/22/2019 FINDINGS: Nonobstructive bowel gas pattern. Moderate stool throughout the colon. No organomegaly or free air. No suspicious calcification. Bilateral hip replacements. No acute  bony abnormality. IMPRESSION: Moderate stool burden.  No acute findings.  Electronically Signed   By: Rolm Baptise M.D.   On: 01/22/2019 18:08   DG Hip Unilat With Pelvis 2-3 Views Right  Result Date: 01/22/2019 CLINICAL DATA:  Right-sided hip pain EXAM: DG HIP (WITH OR WITHOUT PELVIS) 2-3V RIGHT COMPARISON:  12/23/2014 FINDINGS: SI joints are non widened. Pubic symphysis is intact. Status post bilateral hip replacements with normal alignment. Multiple calcifications about the right hip are chronic. IMPRESSION: Status post bilateral hip replacements. No definite acute osseous abnormality. Electronically Signed   By: Donavan Foil M.D.   On: 01/22/2019 17:58     Subjective: Patient seen and examined.  She states she needs to go home.  She does have some pain but mostly improved with Vicodin.  Had 2 bowel movements overnight with no bleeding.   Discharge Exam: Vitals:   01/28/19 2046 01/29/19 0502  BP: 119/77 (!) 100/57  Pulse: 70 83  Resp: 18 18  Temp: 97.9 F (36.6 C) (!) 97.5 F (36.4 C)  SpO2: 95% 94%   Vitals:   01/28/19 0520 01/28/19 1310 01/28/19 2046 01/29/19 0502  BP: 107/81 114/71 119/77 (!) 100/57  Pulse: 68 70 70 83  Resp: 18 18 18 18   Temp: (!) 97.3 F (36.3 C) 97.9 F (36.6 C) 97.9 F (36.6 C) (!) 97.5 F (36.4 C)  TempSrc:  Oral Oral Oral  SpO2: 91% 93% 95% 94%  Weight:    55.9 kg  Height:        General: Pt is alert, awake, not in acute distress Cardiovascular: RRR, S1/S2 +, no rubs, no gallops Respiratory: CTA bilaterally, no wheezing, no rhonchi Abdominal: Soft, NT, ND, bowel sounds + Extremities: no edema, no cyanosis    The results of significant diagnostics from this hospitalization (including imaging, microbiology, ancillary and laboratory) are listed below for reference.     Microbiology: Recent Results (from the past 240 hour(s))  SARS CORONAVIRUS 2 (TAT 6-24 HRS) Nasopharyngeal Nasopharyngeal Swab     Status: None   Collection Time: 01/22/19  9:41 PM   Specimen: Nasopharyngeal Swab  Result Value Ref Range Status    SARS Coronavirus 2 NEGATIVE NEGATIVE Final    Comment: (NOTE) SARS-CoV-2 target nucleic acids are NOT DETECTED. The SARS-CoV-2 RNA is generally detectable in upper and lower respiratory specimens during the acute phase of infection. Negative results do not preclude SARS-CoV-2 infection, do not rule out co-infections with other pathogens, and should not be used as the sole basis for treatment or other patient management decisions. Negative results must be combined with clinical observations, patient history, and epidemiological information. The expected result is Negative. Fact Sheet for Patients: SugarRoll.be Fact Sheet for Healthcare Providers: https://www.woods-mathews.com/ This test is not yet approved or cleared by the [place] FDA and  has been authorized for detection and/or diagnosis of SARS-CoV-2 by FDA under an Emergency Use Authorization (EUA). This EUA will remain  in effect (meaning this test can be used) for the duration of the COVID-19 declaration under Section 56 4(b)(1) of the Act, 21 U.S.C. section 360bbb-3(b)(1), unless the authorization is terminated or revoked sooner. Performed at [facility], [place] [address] [place],  [address]   MRSA PCR Screening     Status: None   Collection Time: 01/23/19  2:35 AM   Specimen: Nasal Mucosa; Nasopharyngeal  Result Value Ref Range Status   MRSA by PCR NEGATIVE NEGATIVE Final    Comment:  The GeneXpert MRSA Assay (FDA approved for NASAL specimens only), is one component of a comprehensive MRSA colonization surveillance program. It is not intended to diagnose MRSA infection nor to guide or monitor treatment for MRSA infections. Performed at [facility], [place] [address], [place], [place] [address]      Labs: BNP (last 3 results) No results for input(s): BNP in the last 8760 hours. Basic Metabolic Panel: Recent Labs  Lab  01/22/19 2115 01/22/19 2115 01/23/19 0521 01/24/19 0202 01/25/19 0126 01/26/19 0508 01/27/19 0452  NA 139   < > 136 136 137 138 135  K 4.0   < > 3.9 4.0 3.9 4.3 4.2  CL 108   < > 106 106 105 104 103  CO2 23   < > 23 22 22 24 26   GLUCOSE 86   < > 102* 115* 115* 114* 96  BUN 20   < > 18 24* 27* 31* 26*  CREATININE 0.58   < > 0.52 0.73 0.72 0.57 0.63  CALCIUM 8.7*   < > 8.1* 8.4* 8.2* 8.7* 8.0*  MG 1.8  --  1.9  --  1.6*  --   --    < > = values in this interval not displayed.   Liver Function Tests: Recent Labs  Lab 01/23/19 0521 01/27/19 0452  AST 20 20  ALT 14 14  ALKPHOS 51 61  BILITOT 0.9 0.8  PROT 6.0* 5.8*  ALBUMIN 3.2* 3.0*   No results for input(s): LIPASE, AMYLASE in the last 168 hours. No results for input(s): AMMONIA in the last 168 hours. CBC: Recent Labs  Lab 01/22/19 2115 01/23/19 0521 01/24/19 0202 01/24/19 0202 01/26/19 1053 01/27/19 0452 01/27/19 1507 01/28/19 0449 01/29/19 0340  WBC 12.1*   < > 9.8  --  10.5 8.7  --  8.6 13.3*  NEUTROABS 7.5  --   --   --   --   --   --   --   --   HGB 11.7*   < > 10.2*   < > 9.2* 7.9* 7.9* 7.5* 8.5*  HCT 35.8*   < > 32.4*   < > 28.8* 24.5* 25.3* 24.0* 27.4*  MCV 99.7   < > 101.6*  --  102.1* 102.9*  --  102.6* 104.6*  PLT 209   < > 207  --  244 210  --  198 253   < > = values in this interval not displayed.   Cardiac Enzymes: No results for input(s): CKTOTAL, CKMB, CKMBINDEX, TROPONINI in the last 168 hours. BNP: Invalid input(s): POCBNP CBG: No results for input(s): GLUCAP in the last 168 hours. D-Dimer No results for input(s): DDIMER in the last 72 hours. Hgb A1c No results for input(s): HGBA1C in the last 72 hours. Lipid Profile No results for input(s): CHOL, HDL, LDLCALC, TRIG, CHOLHDL, LDLDIRECT in the last 72 hours. Thyroid function studies No results for input(s): TSH, T4TOTAL, T3FREE, THYROIDAB in the last 72 hours.  Invalid input(s): FREET3 Anemia work up No results for input(s):  VITAMINB12, FOLATE, FERRITIN, TIBC, IRON, RETICCTPCT in the last 72 hours. Urinalysis    Component Value Date/Time   COLORURINE YELLOW 12/17/2014 1420   APPEARANCEUR CLEAR 12/17/2014 1420   LABSPEC 1.010 12/17/2014 1420   PHURINE 7.0 12/17/2014 1420   GLUCOSEU NEGATIVE 12/17/2014 1420   HGBUR NEGATIVE 12/17/2014 1420   BILIRUBINUR NEGATIVE 12/17/2014 1420   KETONESUR NEGATIVE 12/17/2014 1420   PROTEINUR NEGATIVE 12/17/2014 1420   UROBILINOGEN  0.2 02/16/2014 1412   NITRITE NEGATIVE 12/17/2014 1420   LEUKOCYTESUR NEGATIVE 12/17/2014 1420   Sepsis Labs Invalid input(s): PROCALCITONIN,  WBC,  LACTICIDVEN Microbiology Recent Results (from the past 240 hour(s))  SARS CORONAVIRUS 2 (TAT 6-24 HRS) Nasopharyngeal Nasopharyngeal Swab     Status: None   Collection Time: 01/22/19  9:41 PM   Specimen: Nasopharyngeal Swab  Result Value Ref Range Status   SARS Coronavirus 2 NEGATIVE NEGATIVE Final    Comment: (NOTE) SARS-CoV-2 target nucleic acids are NOT DETECTED. The SARS-CoV-2 RNA is generally detectable in upper and lower respiratory specimens during the acute phase of infection. Negative results do not preclude SARS-CoV-2 infection, do not rule out co-infections with other pathogens, and should not be used as the sole basis for treatment or other patient management decisions. Negative results must be combined with clinical observations, patient history, and epidemiological information. The expected result is Negative. Fact Sheet for Patients: SugarRoll.be Fact Sheet for Healthcare Providers: https://www.woods-mathews.com/ This test is not yet approved or cleared by the [place] FDA and  has been authorized for detection and/or diagnosis of SARS-CoV-2 by FDA under an Emergency Use Authorization (EUA). This EUA will remain  in effect (meaning this test can be used) for the duration of the COVID-19 declaration under Section 56 4(b)(1) of the  Act, 21 U.S.C. section 360bbb-3(b)(1), unless the authorization is terminated or revoked sooner. Performed at [facility], [place] [address] [place], [place] [address]   MRSA PCR Screening     Status: None   Collection Time: 01/23/19  2:35 AM   Specimen: Nasal Mucosa; Nasopharyngeal  Result Value Ref Range Status   MRSA by PCR NEGATIVE NEGATIVE Final    Comment:        The GeneXpert MRSA Assay (FDA approved for NASAL specimens only), is one component of a comprehensive MRSA colonization surveillance program. It is not intended to diagnose MRSA infection nor to guide or monitor treatment for MRSA infections. Performed at [facility], [place] [address], [place], [place] [address]      Time coordinating discharge:  35 minutes  SIGNED:   Barb Merino, MD  [facility] 01/29/2019, 1:54 PM

## 2019-02-06 DIAGNOSIS — I480 Paroxysmal atrial fibrillation: Secondary | ICD-10-CM | POA: Diagnosis not present

## 2019-02-06 DIAGNOSIS — J8410 Pulmonary fibrosis, unspecified: Secondary | ICD-10-CM | POA: Diagnosis not present

## 2019-02-06 DIAGNOSIS — M4856XD Collapsed vertebra, not elsewhere classified, lumbar region, subsequent encounter for fracture with routine healing: Secondary | ICD-10-CM | POA: Diagnosis not present

## 2019-02-06 DIAGNOSIS — K922 Gastrointestinal hemorrhage, unspecified: Secondary | ICD-10-CM | POA: Diagnosis not present

## 2019-02-06 DIAGNOSIS — K5900 Constipation, unspecified: Secondary | ICD-10-CM | POA: Diagnosis not present

## 2019-02-12 ENCOUNTER — Ambulatory Visit (HOSPITAL_COMMUNITY): Payer: PPO | Admitting: Physician Assistant

## 2019-02-17 DIAGNOSIS — M545 Low back pain: Secondary | ICD-10-CM | POA: Diagnosis not present

## 2019-02-17 DIAGNOSIS — M4856XA Collapsed vertebra, not elsewhere classified, lumbar region, initial encounter for fracture: Secondary | ICD-10-CM | POA: Diagnosis not present

## 2019-03-03 ENCOUNTER — Ambulatory Visit: Payer: PPO | Admitting: Internal Medicine

## 2019-03-03 NOTE — Progress Notes (Deleted)
Cardiology Office Note:    Date:  03/03/2019   ID:  Virginia Bullock, DOB [birth year], MRN [id number]  PCP:  Harlan Stains, MD  Cardiologist:  Elouise Munroe, MD  Electrophysiologist:  None   Referring MD: Harlan Stains, MD   Chief Complaint: ***  History of Present Illness:    Virginia Bullock is a 86 y.o. female with a history of ***  Afib RVR noted upon hospitalization recently, therapeutic AC started however patient had blood in stool and anemia, and this was stopped.   Still anemic, should consider Gi evaluation.   Past Medical History:  Diagnosis Date  . Arthritis    right hip  . Basal cell carcinoma of skin 08/03/1975   right side of forehead  . BCC (basal cell carcinoma of skin) 07/06/1975   left side upper back  . BCC (basal cell carcinoma of skin) 11/08/1990   left nose bulb area tx cx3 59fu  . BCC (basal cell carcinoma of skin) 07/13/1993   ant front of shoulder  . BCC (basal cell carcinoma of skin) 07/13/1993   upper right chest  . BCC (basal cell carcinoma of skin) 08/28/1997   right upper arm shoulder tx cx3 67fu  . BCC (basal cell carcinoma of skin) 08/28/1997   right chest  . BCC (basal cell carcinoma of skin) 09/25/2000   upper mid back tx cx3 98fu  . BCC (basal cell carcinoma of skin) 04/24/2002   mid upper back tx cx3 10fu  . BCC (basal cell carcinoma of skin) 02/21/2005   upper right post arm tx cx3 110fu  . BCC (basal cell carcinoma of skin) 06/12/2006   over left lip   . BCC (basal cell carcinoma of skin) 03/23/2005   right nostril tx cx3 85fu  . BCC (basal cell carcinoma of skin) 05/30/2016   left shin tx mohs  . BCC (basal cell carcinoma of skin) 09/25/2016   right nostril tx mohs  . Complication of anesthesia    "epidural with 5th baby-stopped breathing and went numb, 'died'"  . Diverticulosis of colon without hemorrhage 02/27/2014  . Goiter   . History of herpes zoster 02/27/2014  . History of shingles    x2  . Hypothyroidism   .  Impaired hearing   . Loss of hearing 02/27/2014   both  . Migraine headache 02/27/2014   migraines, aura without pain  . OA (osteoarthritis) of hip 02/20/2014  . Osteopenia 02/27/2014  . Post-nasal drip   . SCCA (squamous cell carcinoma) of skin 08/13/2017   right chest tx with bx  . SCCA (squamous cell carcinoma) of skin 10/08/2017   right chest tx with bx  . SCCA (squamous cell carcinoma) of skin 09/15/2000   left chest tx cx3 73fu  . SCCA (squamous cell carcinoma) of skin 08/26/2007   V of neck/chest   . SCCA (squamous cell carcinoma) of skin 01/07/2013   left upper arm tx with bx  . SCCA (squamous cell carcinoma) of skin 05/07/2013   left back shoulder tx with bx  . SCCA (squamous cell carcinoma) of skin 03/24/2015   right chest tx cx3 24fu    Past Surgical History:  Procedure Laterality Date  . ABDOMINAL HYSTERECTOMY  2011   partial  . BASAL CELL CARCINOMA EXCISION    . CERVICAL SPINE SURGERY  24 years ago   2 ruptured disc  . EYE SURGERY Bilateral    lens replacements for cataract  . INNER EAR SURGERY Bilateral as  child   double mastoid  . KNEE ARTHROSCOPY Left 30 years ago   "born with knee cap problem"  . MASTOIDECTOMY Bilateral    as a baby  . NASAL SEPTUM SURGERY    . TONSILLECTOMY  age 54 and 89   x2  . TOTAL HIP ARTHROPLASTY Right 02/20/2014   Procedure: RIGHT TOTAL HIP ARTHROPLASTY ANTERIOR APPROACH;  Surgeon: Gearlean Alf, MD;  Location: [facility];  Service: Orthopedics;  Laterality: Right;  . TOTAL HIP ARTHROPLASTY Left 12/23/2014   Procedure: LEFT TOTAL HIP ARTHROPLASTY ANTERIOR APPROACH;  Surgeon: Gaynelle Arabian, MD;  Location: [facility];  Service: Orthopedics;  Laterality: Left;    Current Medications: No outpatient medications have been marked as taking for the 03/03/19 encounter (Appointment) with Elouise Munroe, MD.     Allergies:   Benadryl [diphenhydramine hcl], Codeine, Coumadin [warfarin sodium], and Demerol [meperidine]   Social History    Socioeconomic History  . Marital status: Widowed    Spouse name: Not on file  . Number of children: Not on file  . Years of education: Not on file  . Highest education level: Not on file  Occupational History  . Not on file  Tobacco Use  . Smoking status: Former Smoker    Types: Cigarettes  . Smokeless tobacco: Never Used  . Tobacco comment: quit 25 years ago  Substance and Sexual Activity  . Alcohol use: Yes    Comment: glass of wine daily  . Drug use: No  . Sexual activity: Not on file  Other Topics Concern  . Not on file  Social History Narrative  . Not on file   Social Determinants of Health   Financial Resource Strain:   . Difficulty of Paying Living Expenses: Not on file  Food Insecurity:   . Worried About Charity fundraiser in the Last Year: Not on file  . Ran Out of Food in the Last Year: Not on file  Transportation Needs:   . Lack of Transportation (Medical): Not on file  . Lack of Transportation (Non-Medical): Not on file  Physical Activity:   . Days of Exercise per Week: Not on file  . Minutes of Exercise per Session: Not on file  Stress:   . Feeling of Stress : Not on file  Social Connections:   . Frequency of Communication with Friends and Family: Not on file  . Frequency of Social Gatherings with Friends and Family: Not on file  . Attends Religious Services: Not on file  . Active Member of Clubs or Organizations: Not on file  . Attends Archivist Meetings: Not on file  . Marital Status: Not on file     Family History: The patient's family history includes Cancer in her maternal grandmother; Tuberculosis in her maternal grandfather.  ROS:   Please see the history of present illness.    All other systems reviewed and are negative.  EKGs/Labs/Other Studies Reviewed:    The following studies were reviewed today:  EKG:  ***  I have independently reviewed the images from Chest Xray/CTA chest/CT chest dated ***.  Recent  Labs: 01/22/2019: TSH 0.542 01/25/2019: Magnesium 1.6 01/27/2019: ALT 14; BUN 26; Creatinine, Ser 0.63; Potassium 4.2; Sodium 135 01/29/2019: Hemoglobin 8.5; Platelets 253  Recent Lipid Panel No results found for: CHOL, TRIG, HDL, CHOLHDL, VLDL, LDLCALC, LDLDIRECT  Physical Exam:    VS:  There were no vitals taken for this visit.    Wt Readings from Last 5 Encounters:  01/29/19  123 lb 4.8 oz (55.9 kg)  12/30/18 125 lb (56.7 kg)  11/18/18 119 lb 9.6 oz (54.3 kg)  10/10/18 121 lb (54.9 kg)  09/09/18 122 lb 6.4 oz (55.5 kg)     Constitutional: No acute distress Eyes: sclera non-icteric, normal conjunctiva and lids ENMT: normal dentition, moist mucous membranes Cardiovascular: regular rhythm, normal rate, no murmurs. S1 and S2 normal. Radial pulses normal bilaterally. No jugular venous distention.  Respiratory: clear to auscultation bilaterally GI : normal bowel sounds, soft and nontender. No distention.   MSK: extremities warm, well perfused. No edema.  NEURO: grossly nonfocal exam, moves all extremities. PSYCH: alert and oriented x 3, normal mood and affect.      ASSESSMENT:    No diagnosis found. PLAN:     Total time of encounter: *** minutes total time of encounter, including *** minutes spent in face-to-face patient care. This time includes coordination of care and counseling regarding above mentioned problem list. Remainder of non-face-to-face time involved reviewing chart documents/testing relevant to the patient encounter and documentation in the medical record. I have independently reviewed documentation from referring provider.   Cherlynn Kaiser, MD [place]  [facility]    Medication Adjustments/Labs and Tests Ordered: Current medicines are reviewed at length with the patient today.  Concerns regarding medicines are outlined above.  No orders of the defined types were placed in this encounter.  No orders of the defined types were placed in this  encounter.   There are no Patient Instructions on file for this visit.

## 2019-03-17 DIAGNOSIS — M4856XA Collapsed vertebra, not elsewhere classified, lumbar region, initial encounter for fracture: Secondary | ICD-10-CM | POA: Diagnosis not present

## 2019-03-17 DIAGNOSIS — M545 Low back pain: Secondary | ICD-10-CM | POA: Diagnosis not present

## 2019-03-25 ENCOUNTER — Ambulatory Visit (INDEPENDENT_AMBULATORY_CARE_PROVIDER_SITE_OTHER): Payer: PPO | Admitting: Pulmonary Disease

## 2019-03-25 ENCOUNTER — Other Ambulatory Visit: Payer: Self-pay

## 2019-03-25 ENCOUNTER — Encounter: Payer: Self-pay | Admitting: Pulmonary Disease

## 2019-03-25 VITALS — BP 98/66 | HR 116 | Temp 97.0°F | Ht 62.0 in | Wt 122.8 lb

## 2019-03-25 DIAGNOSIS — J8410 Pulmonary fibrosis, unspecified: Secondary | ICD-10-CM

## 2019-03-25 NOTE — Patient Instructions (Addendum)
Pulmonary fibrosis  Stable symptoms  Taper off prednisone as you are able to  Stay active  Call with any concerns  I will see you back in 3 months

## 2019-03-25 NOTE — Progress Notes (Signed)
Subjective:    Patient ID: Virginia Bullock, female    DOB: [birth year], 86 y.o.   MRN: [id number]  Patient with a couple years history of cough  Known to have pulmonary fibrosis Has been relatively well with low-dose prednisone  Was recently hospitalized following fall-tripped on a cord Was found to be in A. Fib  She was started on Cardizem which she says makes her very tired and she is to cut the dose in half  The fall was associated with some compression fractures of the lower vertebrae, pain is controlled  She feels having reflux gives her more symptoms currently She does have a cough Sputum can be thick occasionally, clear, no fevers or chills Cough is mostly around mealtime, cough is better  Takes 5 mg of prednisone daily She was off prednisone prior to recent hospitalization This seem to be helping a cough Does not have as much burning/tightness in the middle of the chest  Denies nasal stuffiness but does complain of postnasal drip  Recently had barium swallow which was negative for significant aspiration  Cough is worse in the evening, worse with talking and sometimes around mealtime  Non-smoker     Past Medical History:  Diagnosis Date  . Arthritis    right hip  . Basal cell carcinoma of skin 08/03/1975   right side of forehead  . BCC (basal cell carcinoma of skin) 07/06/1975   left side upper back  . BCC (basal cell carcinoma of skin) 11/08/1990   left nose bulb area tx cx3 46fu  . BCC (basal cell carcinoma of skin) 07/13/1993   ant front of shoulder  . BCC (basal cell carcinoma of skin) 07/13/1993   upper right chest  . BCC (basal cell carcinoma of skin) 08/28/1997   right upper arm shoulder tx cx3 67fu  . BCC (basal cell carcinoma of skin) 08/28/1997   right chest  . BCC (basal cell carcinoma of skin) 09/25/2000   upper mid back tx cx3 68fu  . BCC (basal cell carcinoma of skin) 04/24/2002   mid upper back tx cx3 18fu  . BCC (basal cell carcinoma of  skin) 02/21/2005   upper right post arm tx cx3 65fu  . BCC (basal cell carcinoma of skin) 06/12/2006   over left lip   . BCC (basal cell carcinoma of skin) 03/23/2005   right nostril tx cx3 29fu  . BCC (basal cell carcinoma of skin) 05/30/2016   left shin tx mohs  . BCC (basal cell carcinoma of skin) 09/25/2016   right nostril tx mohs  . Complication of anesthesia    "epidural with 5th baby-stopped breathing and went numb, 'died'"  . Diverticulosis of colon without hemorrhage 02/27/2014  . Goiter   . History of herpes zoster 02/27/2014  . History of shingles    x2  . Hypothyroidism   . Impaired hearing   . Loss of hearing 02/27/2014   both  . Migraine headache 02/27/2014   migraines, aura without pain  . OA (osteoarthritis) of hip 02/20/2014  . Osteopenia 02/27/2014  . Post-nasal drip   . SCCA (squamous cell carcinoma) of skin 08/13/2017   right chest tx with bx  . SCCA (squamous cell carcinoma) of skin 10/08/2017   right chest tx with bx  . SCCA (squamous cell carcinoma) of skin 09/15/2000   left chest tx cx3 20fu  . SCCA (squamous cell carcinoma) of skin 08/26/2007   V of neck/chest   . SCCA (squamous cell carcinoma)  of skin 01/07/2013   left upper arm tx with bx  . SCCA (squamous cell carcinoma) of skin 05/07/2013   left back shoulder tx with bx  . SCCA (squamous cell carcinoma) of skin 03/24/2015   right chest tx cx3 65fu   Family History  Problem Relation Age of Onset  . Cancer Maternal Grandmother   . Tuberculosis Maternal Grandfather     Review of Systems  Constitutional: Negative for fever and unexpected weight change.  HENT: Negative for congestion, dental problem, ear pain, nosebleeds, postnasal drip, rhinorrhea, sinus pressure, sneezing, sore throat and trouble swallowing.   Eyes: Negative for redness and itching.  Respiratory: Positive for cough and shortness of breath. Negative for chest tightness and wheezing.   Cardiovascular: Negative for chest pain,  palpitations and leg swelling.  Gastrointestinal: Negative for nausea and vomiting.  Genitourinary: Negative for dysuria.  Musculoskeletal: Negative for joint swelling.  Skin: Negative for rash.  Allergic/Immunologic: Negative.  Negative for environmental allergies, food allergies and immunocompromised state.  All other systems reviewed and are negative.     Objective:   Physical Exam Constitutional:      Appearance: Normal appearance.  HENT:     Head: Normocephalic and atraumatic.     Nose: Nose normal. No congestion.  Cardiovascular:     Rate and Rhythm: Normal rate.     Pulses: Normal pulses.     Heart sounds: Normal heart sounds. No murmur. No friction rub.  Pulmonary:     Effort: Pulmonary effort is normal. No respiratory distress.     Breath sounds: No stridor. Rales present. No wheezing or rhonchi.     Comments: Does have Velcro rales at the bases Neurological:     Mental Status: She is alert.    BP 98/66 (BP Location: Right Arm, Cuff Size: Normal)   Pulse (!) 116   Temp (!) 97 F (36.1 C) (Temporal)   Ht 5\' 2"  (1.575 m)   Wt 122 lb 12.8 oz (55.7 kg)   SpO2 95% Comment: RA  BMI 22.46 kg/m   Chest x-ray reviewed, chest x-ray from 2018 reviewed as well-prominent interstitial markings likely related to scarring  High-resolution CT scan of the chest reviewed-evidence of fibrosis  Spirometry 10/10/2018 suggestive of restrictive disease, could not complete study recent CT of the abdomen with the lower part of the lungs showing scarring    Assessment & Plan:  .  Patient with advanced pulmonary fibrosis .  Shortness of breath with activity .  Chronic cough with clear sputum production .  Gastroesophageal reflux .  Recently diagnosed with atrial fibrillation  .  Still does not want to be on oxygen supplementation .  She is relatively stable .  Pain from compression fractures in the lower lumbar vertebra is controlled at present   .  Aware of progressive nature of  disease -She does not want to be on a ventilator  Chronic cough likely related to interstitial lung disease  Plan: .  Continue low-dose steroids -Continue at 5 mg daily-she may come off steroids as she is not coughing up as much, also did discuss escalation to twice a day if needed if she is coughing much more  .  Encouraged to use vitamin D and calcium .  Continue cough suppressants .  Continue antireflux medications-on Protonix twice a day  .  To follow-up in about 3 months .  Call with significant concerns

## 2019-03-31 ENCOUNTER — Encounter: Payer: Self-pay | Admitting: Internal Medicine

## 2019-04-22 ENCOUNTER — Encounter: Payer: Self-pay | Admitting: General Practice

## 2019-04-25 ENCOUNTER — Ambulatory Visit: Payer: PPO | Admitting: Internal Medicine

## 2019-04-30 ENCOUNTER — Encounter: Payer: Self-pay | Admitting: Family Medicine

## 2019-05-07 DIAGNOSIS — I480 Paroxysmal atrial fibrillation: Secondary | ICD-10-CM | POA: Diagnosis not present

## 2019-05-07 DIAGNOSIS — J8410 Pulmonary fibrosis, unspecified: Secondary | ICD-10-CM | POA: Diagnosis not present

## 2019-05-07 DIAGNOSIS — D6869 Other thrombophilia: Secondary | ICD-10-CM | POA: Diagnosis not present

## 2019-05-07 DIAGNOSIS — E441 Mild protein-calorie malnutrition: Secondary | ICD-10-CM | POA: Diagnosis not present

## 2019-05-07 DIAGNOSIS — H00011 Hordeolum externum right upper eyelid: Secondary | ICD-10-CM | POA: Diagnosis not present

## 2019-05-07 DIAGNOSIS — K219 Gastro-esophageal reflux disease without esophagitis: Secondary | ICD-10-CM | POA: Diagnosis not present

## 2019-05-07 DIAGNOSIS — R5383 Other fatigue: Secondary | ICD-10-CM | POA: Diagnosis not present

## 2019-05-07 DIAGNOSIS — D509 Iron deficiency anemia, unspecified: Secondary | ICD-10-CM | POA: Diagnosis not present

## 2019-05-14 DIAGNOSIS — H524 Presbyopia: Secondary | ICD-10-CM | POA: Diagnosis not present

## 2019-05-14 DIAGNOSIS — H0012 Chalazion right lower eyelid: Secondary | ICD-10-CM | POA: Diagnosis not present

## 2019-05-14 DIAGNOSIS — H0011 Chalazion right upper eyelid: Secondary | ICD-10-CM | POA: Diagnosis not present

## 2019-05-14 DIAGNOSIS — H353131 Nonexudative age-related macular degeneration, bilateral, early dry stage: Secondary | ICD-10-CM | POA: Diagnosis not present

## 2019-05-23 ENCOUNTER — Encounter: Payer: Self-pay | Admitting: Cardiology

## 2019-05-23 DIAGNOSIS — H00011 Hordeolum externum right upper eyelid: Secondary | ICD-10-CM | POA: Diagnosis not present

## 2019-05-23 DIAGNOSIS — I480 Paroxysmal atrial fibrillation: Secondary | ICD-10-CM | POA: Diagnosis not present

## 2019-05-23 DIAGNOSIS — R5383 Other fatigue: Secondary | ICD-10-CM | POA: Diagnosis not present

## 2019-05-23 DIAGNOSIS — H0011 Chalazion right upper eyelid: Secondary | ICD-10-CM | POA: Diagnosis not present

## 2019-05-23 DIAGNOSIS — R946 Abnormal results of thyroid function studies: Secondary | ICD-10-CM | POA: Diagnosis not present

## 2019-05-23 DIAGNOSIS — J8410 Pulmonary fibrosis, unspecified: Secondary | ICD-10-CM | POA: Diagnosis not present

## 2019-05-29 ENCOUNTER — Other Ambulatory Visit: Payer: Self-pay

## 2019-05-29 ENCOUNTER — Encounter: Payer: Self-pay | Admitting: Cardiology

## 2019-05-29 ENCOUNTER — Ambulatory Visit (INDEPENDENT_AMBULATORY_CARE_PROVIDER_SITE_OTHER): Payer: PPO | Admitting: Cardiology

## 2019-05-29 VITALS — BP 118/76 | HR 128 | Temp 96.8°F | Ht 62.5 in | Wt 126.8 lb

## 2019-05-29 DIAGNOSIS — Z7189 Other specified counseling: Secondary | ICD-10-CM | POA: Diagnosis not present

## 2019-05-29 DIAGNOSIS — I4891 Unspecified atrial fibrillation: Secondary | ICD-10-CM

## 2019-05-29 DIAGNOSIS — R5383 Other fatigue: Secondary | ICD-10-CM | POA: Diagnosis not present

## 2019-05-29 MED ORDER — DILTIAZEM HCL 30 MG PO TABS: 30.0000 mg | ORAL_TABLET | Freq: Four times a day (QID) | ORAL | 11 refills | Status: DC | PRN

## 2019-05-29 NOTE — Patient Instructions (Signed)
Medication Instructions:  Stop taking Aspirin 325 mg and diltiazem 90 mg daily  Start: diltiazem 30 mg by mouth 4 (four) times daily as needed (for when heart rate is fast).                  *If you need a refill on your cardiac medications before your next appointment, please call your pharmacy*   Lab Work: None  Testing/Procedures: None   Follow-Up: At [facility], you and your health needs are our priority.  As part of our continuing mission to provide you with exceptional heart care, we have created designated Provider Care Teams.  These Care Teams include your primary Cardiologist (physician) and Advanced Practice Providers (APPs -  Physician Assistants and Nurse Practitioners) who all work together to provide you with the care you need, when you need it.  We recommend signing up for the patient portal called "MyChart".  Sign up information is provided on this After Visit Summary.  MyChart is used to connect with patients for Virtual Visits (Telemedicine).  Patients are able to view lab/test results, encounter notes, upcoming appointments, etc.  Non-urgent messages can be sent to your provider as well.   To learn more about what you can do with MyChart, go to [email or web address].    Your next appointment:   6-8 week(s)  The format for your next appointment:   Virtual Visit   Provider:   Buford Dresser, MD

## 2019-05-29 NOTE — Progress Notes (Signed)
Cardiology Office Note:    Date:  05/29/2019   ID:  FRANKLYN DAMIEN, DOB [birth year], MRN [id number]  PCP:  Harlan Stains, MD  Cardiologist:  Buford Dresser, MD  Referring MD: Harlan Stains, MD   CC: fatigue  History of Present Illness:    Virginia Bullock is a 86 y.o. female with a hx of mild MR, mild-moderate AR, pulmonary fibrosis, paroxysmal atrial fibrillation, chronic anemia. She was seen 06/05/18 by Dr. Margaretann Loveless for shortness of breath/cough, and prior to this she had been followed by Dr. Wynonia Lawman. She was last seen in the hospital 01/2019 for atrial fibrillation with RVR during an admission 2/2 L2 compression fracture from mechanical fall. She was scheduled to see me as a new patient per referral from Dr. Dema Severin, but I do not have any recent records regarding new concerns since her hospitalization.  She presents today for follow up. "Nothing worries me." "My heart is fine." "I want to stop these medications." She is audibly dyspneic on conversation. On ROS, her main concern is fatigue.  Doesn't check heart rate or blood pressure at home. Wants to get off the diltiazem as it makes her tired. We discussed atrial fibrillation, rate control, etc. She is adamant that she wants quality of life over quantity. She cannot feel when her heart is fast. Discussed PRN diltiazem as an option.  During her recent hospitalization, trialed on rivaroxaban. Developed hematochezia plus known chronic anemia, elected to not continue anticoagulation given that she does not want endoscopy. She is taking aspirin 325 mg daily, though this is not on her medication list. Discussed that aspirin does not prevent blood clots in afib, and it is more likely to cause GI bleeding. She will stop this today.  Has chronic bilateral ankle tendon issues that look like swelling, but she states this is long term.  We did extensive shared decision making today. She has clear understanding of the risks and benefits of  treatment options, and she has chosen the plan as documented. She understands to call our office with any questions or concerns.  Denies chest pain, PND, orthopnea, change in LE edema or unexpected weight gain. No syncope or palpitations. Cannot feel her elevated heart rate.  Past Medical History:  Diagnosis Date  . Arthritis    right hip  . Basal cell carcinoma of skin 08/03/1975   right side of forehead  . BCC (basal cell carcinoma of skin) 07/06/1975   left side upper back  . BCC (basal cell carcinoma of skin) 11/08/1990   left nose bulb area tx cx3 6fu  . BCC (basal cell carcinoma of skin) 07/13/1993   ant front of shoulder  . BCC (basal cell carcinoma of skin) 07/13/1993   upper right chest  . BCC (basal cell carcinoma of skin) 08/28/1997   right upper arm shoulder tx cx3 87fu  . BCC (basal cell carcinoma of skin) 08/28/1997   right chest  . BCC (basal cell carcinoma of skin) 09/25/2000   upper mid back tx cx3 66fu  . BCC (basal cell carcinoma of skin) 04/24/2002   mid upper back tx cx3 75fu  . BCC (basal cell carcinoma of skin) 02/21/2005   upper right post arm tx cx3 43fu  . BCC (basal cell carcinoma of skin) 06/12/2006   over left lip   . BCC (basal cell carcinoma of skin) 03/23/2005   right nostril tx cx3 71fu  . BCC (basal cell carcinoma of skin) 05/30/2016   left shin tx mohs  .  BCC (basal cell carcinoma of skin) 09/25/2016   right nostril tx mohs  . Complication of anesthesia    "epidural with 5th baby-stopped breathing and went numb, 'died'"  . Diverticulosis of colon without hemorrhage 02/27/2014  . Goiter   . History of herpes zoster 02/27/2014  . History of shingles    x2  . Hypothyroidism   . Impaired hearing   . Loss of hearing 02/27/2014   both  . Migraine headache 02/27/2014   migraines, aura without pain  . OA (osteoarthritis) of hip 02/20/2014  . Osteopenia 02/27/2014  . Post-nasal drip   . SCCA (squamous cell carcinoma) of skin 08/13/2017   right chest  tx with bx  . SCCA (squamous cell carcinoma) of skin 10/08/2017   right chest tx with bx  . SCCA (squamous cell carcinoma) of skin 09/15/2000   left chest tx cx3 76fu  . SCCA (squamous cell carcinoma) of skin 08/26/2007   V of neck/chest   . SCCA (squamous cell carcinoma) of skin 01/07/2013   left upper arm tx with bx  . SCCA (squamous cell carcinoma) of skin 05/07/2013   left back shoulder tx with bx  . SCCA (squamous cell carcinoma) of skin 03/24/2015   right chest tx cx3 55fu    Past Surgical History:  Procedure Laterality Date  . ABDOMINAL HYSTERECTOMY  2011   partial  . BASAL CELL CARCINOMA EXCISION    . CERVICAL SPINE SURGERY  24 years ago   2 ruptured disc  . EYE SURGERY Bilateral    lens replacements for cataract  . INNER EAR SURGERY Bilateral as child   double mastoid  . KNEE ARTHROSCOPY Left 30 years ago   "born with knee cap problem"  . MASTOIDECTOMY Bilateral    as a baby  . NASAL SEPTUM SURGERY    . TONSILLECTOMY  age 70 and 6   x2  . TOTAL HIP ARTHROPLASTY Right 02/20/2014   Procedure: RIGHT TOTAL HIP ARTHROPLASTY ANTERIOR APPROACH;  Surgeon: Gearlean Alf, MD;  Location: [facility];  Service: Orthopedics;  Laterality: Right;  . TOTAL HIP ARTHROPLASTY Left 12/23/2014   Procedure: LEFT TOTAL HIP ARTHROPLASTY ANTERIOR APPROACH;  Surgeon: Gaynelle Arabian, MD;  Location: [facility];  Service: Orthopedics;  Laterality: Left;    Current Medications: Current Outpatient Medications on File Prior to Visit  Medication Sig  . acetaminophen (TYLENOL) 325 MG tablet Take 650 mg by mouth every 6 (six) hours as needed.  . Acetylcysteine 600 MG CAPS Take 1 capsule (600 mg total) by mouth 2 (two) times daily.  Marland Kitchen diltiazem (CARDIZEM CD) 120 MG 24 hr capsule Take 1 capsule (120 mg total) by mouth daily.  Marland Kitchen lactulose (CHRONULAC) 10 GM/15ML solution Take 30 mLs (20 g total) by mouth 2 (two) times daily as needed for mild constipation or moderate constipation.  . ondansetron (ZOFRAN) 4 MG  tablet Take 1 tablet (4 mg total) by mouth every 8 (eight) hours as needed for nausea or vomiting.  . pantoprazole (PROTONIX) 40 MG tablet Take 40 mg by mouth 2 (two) times daily.    No current facility-administered medications on file prior to visit.     Allergies:   Benadryl [diphenhydramine hcl], Codeine, Coumadin [warfarin sodium], and Demerol [meperidine]   Social History   Tobacco Use  . Smoking status: Former Smoker    Types: Cigarettes  . Smokeless tobacco: Never Used  . Tobacco comment: quit 25 years ago  Substance Use Topics  . Alcohol use: Yes  Comment: glass of wine daily  . Drug use: No    Family History: family history includes Cancer in her maternal grandmother; Tuberculosis in her maternal grandfather.  ROS:   Please see the history of present illness.  Additional pertinent ROS otherwise unremarkable.  EKGs/Labs/Other Studies Reviewed:    The following studies were reviewed today: Echo 06/24/2018 1. The left ventricle has normal systolic function, with an ejection  fraction of 55-60%. The cavity size was normal. Left ventricular diastolic  parameters were normal.  2. The right ventricle has normal systolic function. The cavity was  normal. There is no increase in right ventricular wall thickness.  3. Left atrial size was mildly dilated.  4. The mitral valve is degenerative. Moderate thickening of the mitral  valve leaflet. Moderate calcification of the mitral valve leaflet. There  is moderate mitral annular calcification present. Mitral valve  regurgitation is moderate by color flow  Doppler.  5. Tricuspid valve regurgitation is mild-moderate.  6. The aortic valve is tricuspid. Moderate thickening of the aortic  valve. Sclerosis without any evidence of stenosis of the aortic valve.  Aortic valve regurgitation is mild by color flow Doppler.   EKG:  EKG is personally reviewed.  The ekg ordered today demonstrates atrial fibrillation with RVR at 128  bpm, PVC, nonspecific ST changes  Recent Labs: 01/22/2019: TSH 0.542 01/25/2019: Magnesium 1.6 01/27/2019: ALT 14; BUN 26; Creatinine, Ser 0.63; Potassium 4.2; Sodium 135 01/29/2019: Hemoglobin 8.5; Platelets 253  Recent Lipid Panel No results found for: CHOL, TRIG, HDL, CHOLHDL, VLDL, LDLCALC, LDLDIRECT  Physical Exam:    VS:  BP 118/76   Pulse (!) 128   Temp (!) 96.8 F (36 C)   Ht 5' 2.5" (1.588 m)   Wt 126 lb 12.8 oz (57.5 kg)   SpO2 99%   BMI 22.82 kg/m     Wt Readings from Last 3 Encounters:  05/29/19 126 lb 12.8 oz (57.5 kg)  03/25/19 122 lb 12.8 oz (55.7 kg)  01/29/19 123 lb 4.8 oz (55.9 kg)    GEN: Well nourished, well developed in no acute distress HEENT: Normal, moist mucous membranes NECK: No JVD CARDIAC: tachycardic, irregularly irregular rhythm, normal S1 and S2, no rubs or gallops. No murmurs appreciated with tachycardia. VASCULAR: Radial and DP pulses 2+ bilaterally.  RESPIRATORY:  Clear to auscultation without rales, wheezing or rhonchi  ABDOMEN: Soft, non-tender, non-distended MUSCULOSKELETAL:  Ambulates independently SKIN: Warm and dry, no significant edema but bilateral ankle bursa enlargement. NEUROLOGIC:  Alert and oriented x 3. No focal neuro deficits noted. PSYCHIATRIC:  Normal affect    ASSESSMENT:    1. Atrial fibrillation with RVR ([place])   2. Fatigue, unspecified type   3. Cardiac risk counseling   4. Counseling on health promotion and disease prevention    PLAN:    Atrial fibrillation with RVR: -CHA2DS2/VAS Stroke Risk Points= 3 -trialed on rivaroxaban during recent admission. Developed hematochezia plus known chronic anemia, elected to not continue anticoagulation given that she does not want endoscopy.  -She is taking aspirin 325 mg daily, though this is not on her medication list. Discussed that aspirin does not prevent blood clots in afib, and it is more likely to cause GI bleeding. She will stop this today. -she reports feeling well.  Discussed that we cannot pursue cardioversion or antiarrhythmic without anticoagulation. She wishes to minimize her medications -she had wanted to stop the diltiazem completely as she felt it was making her tired. Discussed that it may be the  afib causing fatigue. She does not want to continue daily diltiazem but is willing to change to short acting diltiazem as needed for elevated heart rate -we did extensive shared decision making today. She understands and wishes to pursue minimal treatment, focusing on symptoms  Cardiac risk counseling and prevention recommendations: -recommend heart healthy/Mediterranean diet, with whole grains, fruits, vegetable, fish, lean meats, nuts, and olive oil. Limit salt. -recommend moderate walking, 3-5 times/week for 30-50 minutes each session. Aim for at least 150 minutes.week. Goal should be pace of 3 miles/hours, or walking 1.5 miles in 30 minutes -recommend avoidance of tobacco products. Avoid excess alcohol. -ASCVD risk score: The ASCVD Risk score [place] Bussing DC Jr., [place] al., 2013) failed to calculate for the following reasons:   The 2013 ASCVD risk score is only valid for ages 17 to 51    Plan for follow up: 6-8 weeks or sooner if she does not improve. She would like to follow up with me.  Total time of encounter: 45 minutes total time of encounter, including 25 minutes spent in face-to-face patient care. This time includes coordination of care and counseling regarding atrial fibrillation. Remainder of non-face-to-face time involved reviewing chart documents/testing relevant to the patient encounter and documentation in the medical record.  Buford Dresser, MD, PhD [place]  [facility]   Medication Adjustments/Labs and Tests Ordered: Current medicines are reviewed at length with the patient today.  Concerns regarding medicines are outlined above.  Orders Placed This Encounter  Procedures  . EKG 12-Lead   Meds ordered this encounter  Medications   . diltiazem (CARDIZEM) 30 MG tablet    Sig: Take 1 tablet (30 mg total) by mouth 4 (four) times daily as needed (for when heart rate is fast).    Dispense:  30 tablet    Refill:  11    Patient Instructions  Medication Instructions:  Stop taking Aspirin 325 mg and diltiazem 90 mg daily  Start: diltiazem 30 mg by mouth 4 (four) times daily as needed (for when heart rate is fast).                  *If you need a refill on your cardiac medications before your next appointment, please call your pharmacy*   Lab Work: None  Testing/Procedures: None   Follow-Up: At [facility], you and your health needs are our priority.  As part of our continuing mission to provide you with exceptional heart care, we have created designated Provider Care Teams.  These Care Teams include your primary Cardiologist (physician) and Advanced Practice Providers (APPs -  Physician Assistants and Nurse Practitioners) who all work together to provide you with the care you need, when you need it.  We recommend signing up for the patient portal called "MyChart".  Sign up information is provided on this After Visit Summary.  MyChart is used to connect with patients for Virtual Visits (Telemedicine).  Patients are able to view lab/test results, encounter notes, upcoming appointments, etc.  Non-urgent messages can be sent to your provider as well.   To learn more about what you can do with MyChart, go to [email or web address].    Your next appointment:   6-8 week(s)  The format for your next appointment:   Virtual Visit   Provider:   Buford Dresser, MD       Signed, Buford Dresser, MD PhD 05/29/2019   [place]

## 2019-06-17 DIAGNOSIS — H0015 Chalazion left lower eyelid: Secondary | ICD-10-CM | POA: Diagnosis not present

## 2019-07-14 ENCOUNTER — Encounter: Payer: Self-pay | Admitting: Cardiology

## 2019-07-28 ENCOUNTER — Telehealth: Payer: Self-pay | Admitting: Pulmonary Disease

## 2019-07-28 NOTE — Telephone Encounter (Signed)
Called patient's son Jeneen Rinks, patient is ready to start oxygen. Triage scheduled a qualifying.

## 2019-08-04 ENCOUNTER — Ambulatory Visit: Payer: PPO

## 2019-08-04 ENCOUNTER — Other Ambulatory Visit: Payer: Self-pay

## 2019-08-04 DIAGNOSIS — J8410 Pulmonary fibrosis, unspecified: Secondary | ICD-10-CM

## 2019-08-05 ENCOUNTER — Telehealth: Payer: Self-pay | Admitting: Pulmonary Disease

## 2019-08-05 DIAGNOSIS — J8410 Pulmonary fibrosis, unspecified: Secondary | ICD-10-CM | POA: Diagnosis not present

## 2019-08-05 NOTE — Telephone Encounter (Signed)
Called [employer] & left vm for Leah to call me with status of order.

## 2019-08-05 NOTE — Telephone Encounter (Signed)
Called Melissa & left her vm to see if she can give me status of order.

## 2019-08-05 NOTE — Telephone Encounter (Signed)
Virginia Bullock called me back & states O2 was delivered today at 1:18.  Nothing further needed.

## 2019-08-05 NOTE — Telephone Encounter (Signed)
Order was placed yesterday 08/04/19 for pt to receive O2.

## 2019-08-07 DIAGNOSIS — R110 Nausea: Secondary | ICD-10-CM | POA: Diagnosis not present

## 2019-08-07 DIAGNOSIS — I480 Paroxysmal atrial fibrillation: Secondary | ICD-10-CM | POA: Diagnosis not present

## 2019-08-07 DIAGNOSIS — D509 Iron deficiency anemia, unspecified: Secondary | ICD-10-CM | POA: Diagnosis not present

## 2019-08-07 DIAGNOSIS — R946 Abnormal results of thyroid function studies: Secondary | ICD-10-CM | POA: Diagnosis not present

## 2019-08-07 DIAGNOSIS — J8410 Pulmonary fibrosis, unspecified: Secondary | ICD-10-CM | POA: Diagnosis not present

## 2019-08-07 DIAGNOSIS — D6869 Other thrombophilia: Secondary | ICD-10-CM | POA: Diagnosis not present

## 2019-08-07 DIAGNOSIS — K5900 Constipation, unspecified: Secondary | ICD-10-CM | POA: Diagnosis not present

## 2019-08-07 DIAGNOSIS — R609 Edema, unspecified: Secondary | ICD-10-CM | POA: Diagnosis not present

## 2019-08-20 DIAGNOSIS — I480 Paroxysmal atrial fibrillation: Secondary | ICD-10-CM | POA: Diagnosis not present

## 2019-08-20 DIAGNOSIS — I5021 Acute systolic (congestive) heart failure: Secondary | ICD-10-CM | POA: Diagnosis not present

## 2019-08-20 DIAGNOSIS — J8410 Pulmonary fibrosis, unspecified: Secondary | ICD-10-CM | POA: Diagnosis not present

## 2019-08-22 ENCOUNTER — Encounter: Payer: Self-pay | Admitting: Cardiology

## 2019-08-22 ENCOUNTER — Other Ambulatory Visit: Payer: Self-pay

## 2019-08-22 ENCOUNTER — Telehealth: Payer: Self-pay | Admitting: Cardiology

## 2019-08-22 ENCOUNTER — Ambulatory Visit (INDEPENDENT_AMBULATORY_CARE_PROVIDER_SITE_OTHER): Payer: PPO | Admitting: Cardiology

## 2019-08-22 VITALS — BP 96/70 | HR 120 | Temp 97.1°F | Ht 62.0 in | Wt 128.0 lb

## 2019-08-22 DIAGNOSIS — J8410 Pulmonary fibrosis, unspecified: Secondary | ICD-10-CM

## 2019-08-22 DIAGNOSIS — R0602 Shortness of breath: Secondary | ICD-10-CM

## 2019-08-22 DIAGNOSIS — I4891 Unspecified atrial fibrillation: Secondary | ICD-10-CM | POA: Diagnosis not present

## 2019-08-22 DIAGNOSIS — R600 Localized edema: Secondary | ICD-10-CM | POA: Diagnosis not present

## 2019-08-22 DIAGNOSIS — Z7189 Other specified counseling: Secondary | ICD-10-CM

## 2019-08-22 NOTE — Patient Instructions (Addendum)
Medication Instructions:  Your Physician recommend you continue on your current medication as directed.    *If you need a refill on your cardiac medications before your next appointment, please call your pharmacy*   Lab Work: None   Testing/Procedures: None   Follow-Up: At [facility], you and your health needs are our priority.  As part of our continuing mission to provide you with exceptional heart care, we have created designated Provider Care Teams.  These Care Teams include your primary Cardiologist (physician) and Advanced Practice Providers (APPs -  Physician Assistants and Nurse Practitioners) who all work together to provide you with the care you need, when you need it.  We recommend signing up for the patient portal called "MyChart".  Sign up information is provided on this After Visit Summary.  MyChart is used to connect with patients for Virtual Visits (Telemedicine).  Patients are able to view lab/test results, encounter notes, upcoming appointments, etc.  Non-urgent messages can be sent to your provider as well.   To learn more about what you can do with MyChart, go to [email or web address].    Your next appointment:   2-3 week(s)  The format for your next appointment:   Virtual Visit   Provider:   Bridgette Christopher, MD    

## 2019-08-22 NOTE — Telephone Encounter (Signed)
Spoke with pt, she reports she does not have a portable tank and will need oxygen. She does not feel she can walk to the elevators and into our office without the oxygen and feels she will need help. Advised the patient to call and someone can come down to help her upstairs.

## 2019-08-22 NOTE — Telephone Encounter (Signed)
Patient states she will need oxygen during her appointment scheduled for today, 08/13/212 at 1:20 PM with Dr. Harrell Gave. Please call to discuss.

## 2019-08-22 NOTE — Progress Notes (Signed)
Cardiology Office Note:    Date:  08/22/2019   ID:  Virginia Bullock, DOB [birth year], MRN [id number]  PCP:  Harlan Stains, MD  Cardiologist:  Buford Dresser, MD  Referring MD: Harlan Stains, MD   CC: follow up  History of Present Illness:    Virginia Bullock is a 86 y.o. female with a hx of mild MR, mild-moderate AR, pulmonary fibrosis, paroxysmal atrial fibrillation, chronic anemia seen for follow up today. See note from 05/29/19 for summary of history and shared decision making.  Today: Again audibly short of breath on conversation. Has to stop several times to walk 20 feet into the clinic room. Reports her weight is up, legs very swollen, leaking fluid. At peak was up 10 lbs of fluid. Started on a diuretic by Dr. Dema Severin, recommended to take 2 pills in the morning and 2 at night, doesn't know the dose or name of the diuertic. She has blisters on her legs leaking fluid and draining onto her pants. She reports though her feet were much worse before the diuretic. Feels that fluid came on very quickly. Came on about two weeks ago.   Breathing is the same as her baseline. Started oxygen several weeks ago. She relented and accepted this but she doesn't want to be dependent on it. Has talked to Dr. Dema Severin about hospice and palliative care.  Anemia continued to progress, last Hgb 10.2, recommended to start on iron supplements. Hasn't started this yet.   We spent significant time today discussing her wishes. She was initially not interested in either hospice or palliative care. We discussed that her goals are for quality of time, not quantity. Discussed what palliative care can offer in terms of at-home symptom management. After lengthy discussion, she is amenable to speaking to palliative care.  Past Medical History:  Diagnosis Date  . Arthritis    right hip  . Basal cell carcinoma of skin 08/03/1975   right side of forehead  . BCC (basal cell carcinoma of skin) 07/06/1975   left side  upper back  . BCC (basal cell carcinoma of skin) 11/08/1990   left nose bulb area tx cx3 24fu  . BCC (basal cell carcinoma of skin) 07/13/1993   ant front of shoulder  . BCC (basal cell carcinoma of skin) 07/13/1993   upper right chest  . BCC (basal cell carcinoma of skin) 08/28/1997   right upper arm shoulder tx cx3 50fu  . BCC (basal cell carcinoma of skin) 08/28/1997   right chest  . BCC (basal cell carcinoma of skin) 09/25/2000   upper mid back tx cx3 76fu  . BCC (basal cell carcinoma of skin) 04/24/2002   mid upper back tx cx3 71fu  . BCC (basal cell carcinoma of skin) 02/21/2005   upper right post arm tx cx3 52fu  . BCC (basal cell carcinoma of skin) 06/12/2006   over left lip   . BCC (basal cell carcinoma of skin) 03/23/2005   right nostril tx cx3 38fu  . BCC (basal cell carcinoma of skin) 05/30/2016   left shin tx mohs  . BCC (basal cell carcinoma of skin) 09/25/2016   right nostril tx mohs  . Complication of anesthesia    "epidural with 5th baby-stopped breathing and went numb, 'died'"  . Diverticulosis of colon without hemorrhage 02/27/2014  . Goiter   . History of herpes zoster 02/27/2014  . History of shingles    x2  . Hypothyroidism   . Impaired hearing   .  Loss of hearing 02/27/2014   both  . Migraine headache 02/27/2014   migraines, aura without pain  . OA (osteoarthritis) of hip 02/20/2014  . Osteopenia 02/27/2014  . Post-nasal drip   . SCCA (squamous cell carcinoma) of skin 08/13/2017   right chest tx with bx  . SCCA (squamous cell carcinoma) of skin 10/08/2017   right chest tx with bx  . SCCA (squamous cell carcinoma) of skin 09/15/2000   left chest tx cx3 10fu  . SCCA (squamous cell carcinoma) of skin 08/26/2007   V of neck/chest   . SCCA (squamous cell carcinoma) of skin 01/07/2013   left upper arm tx with bx  . SCCA (squamous cell carcinoma) of skin 05/07/2013   left back shoulder tx with bx  . SCCA (squamous cell carcinoma) of skin 03/24/2015   right  chest tx cx3 22fu    Past Surgical History:  Procedure Laterality Date  . ABDOMINAL HYSTERECTOMY  2011   partial  . BASAL CELL CARCINOMA EXCISION    . CERVICAL SPINE SURGERY  24 years ago   2 ruptured disc  . EYE SURGERY Bilateral    lens replacements for cataract  . INNER EAR SURGERY Bilateral as child   double mastoid  . KNEE ARTHROSCOPY Left 30 years ago   "born with knee cap problem"  . MASTOIDECTOMY Bilateral    as a baby  . NASAL SEPTUM SURGERY    . TONSILLECTOMY  age 78 and 24   x2  . TOTAL HIP ARTHROPLASTY Right 02/20/2014   Procedure: RIGHT TOTAL HIP ARTHROPLASTY ANTERIOR APPROACH;  Surgeon: Gearlean Alf, MD;  Location: [facility];  Service: Orthopedics;  Laterality: Right;  . TOTAL HIP ARTHROPLASTY Left 12/23/2014   Procedure: LEFT TOTAL HIP ARTHROPLASTY ANTERIOR APPROACH;  Surgeon: Gaynelle Arabian, MD;  Location: [facility];  Service: Orthopedics;  Laterality: Left;    Current Medications: Current Outpatient Medications on File Prior to Visit  Medication Sig  . acetaminophen (TYLENOL) 325 MG tablet Take 650 mg by mouth every 6 (six) hours as needed.  . Acetylcysteine 600 MG CAPS Take 1 capsule (600 mg total) by mouth 2 (two) times daily.  Marland Kitchen diltiazem (CARDIZEM) 30 MG tablet Take 1 tablet (30 mg total) by mouth 4 (four) times daily as needed (for when heart rate is fast).  Marland Kitchen lactulose (CHRONULAC) 10 GM/15ML solution Take 30 mLs (20 g total) by mouth 2 (two) times daily as needed for mild constipation or moderate constipation.  . ondansetron (ZOFRAN) 4 MG tablet Take 1 tablet (4 mg total) by mouth every 8 (eight) hours as needed for nausea or vomiting.  . pantoprazole (PROTONIX) 40 MG tablet Take 40 mg by mouth 2 (two) times daily.   . predniSONE (DELTASONE) 1 MG tablet    No current facility-administered medications on file prior to visit.     Allergies:   Benadryl [diphenhydramine hcl], Codeine, Coumadin [warfarin sodium], and Demerol [meperidine]   Social History    Tobacco Use  . Smoking status: Former Smoker    Types: Cigarettes  . Smokeless tobacco: Never Used  . Tobacco comment: quit 25 years ago  Substance Use Topics  . Alcohol use: Yes    Comment: glass of wine daily  . Drug use: No    Family History: family history includes Cancer in her maternal grandmother; Tuberculosis in her maternal grandfather.  ROS:   Please see the history of present illness.  Additional pertinent ROS otherwise unremarkable.  EKGs/Labs/Other Studies Reviewed:  The following studies were reviewed today: Echo 06/24/2018 1. The left ventricle has normal systolic function, with an ejection  fraction of 55-60%. The cavity size was normal. Left ventricular diastolic  parameters were normal.  2. The right ventricle has normal systolic function. The cavity was  normal. There is no increase in right ventricular wall thickness.  3. Left atrial size was mildly dilated.  4. The mitral valve is degenerative. Moderate thickening of the mitral  valve leaflet. Moderate calcification of the mitral valve leaflet. There  is moderate mitral annular calcification present. Mitral valve  regurgitation is moderate by color flow  Doppler.  5. Tricuspid valve regurgitation is mild-moderate.  6. The aortic valve is tricuspid. Moderate thickening of the aortic  valve. Sclerosis without any evidence of stenosis of the aortic valve.  Aortic valve regurgitation is mild by color flow Doppler.   EKG:  EKG is personally reviewed.  The ekg ordered today demonstrates atrial fibrillation with RVR at 120 bpm  Recent Labs: 01/22/2019: TSH 0.542 01/25/2019: Magnesium 1.6 01/27/2019: ALT 14; BUN 26; Creatinine, Ser 0.63; Potassium 4.2; Sodium 135 01/29/2019: Hemoglobin 8.5; Platelets 253  Recent Lipid Panel No results found for: CHOL, TRIG, HDL, CHOLHDL, VLDL, LDLCALC, LDLDIRECT  Physical Exam:    VS:  BP 96/70   Pulse (!) 120   Temp (!) 97.1 F (36.2 C)   Ht 5\' 2"  (1.575 m)    Wt 128 lb (58.1 kg)   SpO2 97%   BMI 23.41 kg/m     Wt Readings from Last 3 Encounters:  05/29/19 126 lb 12.8 oz (57.5 kg)  03/25/19 122 lb 12.8 oz (55.7 kg)  01/29/19 123 lb 4.8 oz (55.9 kg)    GEN: frail appearing, conversationally dyspneic HEENT: Normal, moist mucous membranes NECK: No JVD CARDIAC: tachycardic, irregularly irregular rhythm, normal S1 and S2, no rubs or gallops. No murmur. VASCULAR: Radial and DP pulses 2+ bilaterally. No carotid bruits RESPIRATORY:  No appreciable wheezing but diffusely distant and finely coarse ABDOMEN: Soft, non-tender, non-distended MUSCULOSKELETAL:  Ambulates independently SKIN: Warm and dry, but diffuse edema, with 3+ bilateral LE edema with weeping of clear fluid onto her pants NEUROLOGIC:  Alert and oriented x 3. No focal neuro deficits noted. PSYCHIATRIC:  Normal affect   ASSESSMENT:    1. Goals of care, counseling/discussion   2. Atrial fibrillation with RVR ([place])   3. SOB (shortness of breath)   4. Pulmonary fibrosis ([place])   5. Bilateral leg edema    PLAN:    Atrial fibrillation with RVR: CHA2DS2/VAS Stroke Risk Points= 3 Shortness of breath, pulmonary fibrosis Bilateral LE edema -we had an extended conversation today. See our prior visit about anticoagulation, rate control. She feels strongly that medications have only made her feel worse. We discussed atrial fibrillation, risk of stroke, rate control at length today -her main goal is to be able to live at home with as good a quality of life as she can have -we discussed palliative care at length today. With her pulmonary fibrosis, she cannot be "cured," and with her persistent atrial fibrillation with RVR and shortness of breath, I expect she has life limiting conditions -after shared decision making, she is amenable to speaking with palliative care. Referral made today. -would continue with the furosemide -counseled on red flag warning signs that need immediate medical  attention  Plan for follow up: 2-3 weeks given severity of symptoms  Total time of encounter: 51 minutes total time of encounter on day of encounter,  including 50 minutes spent in face-to-face patient care. This time includes coordination of care and counseling regarding atrial fibrillation, edema, palliative care. Remainder of non-face-to-face time involved reviewing chart documents/testing relevant to the patient encounter and documentation in the medical record.  Buford Dresser, MD, PhD [place]  [facility]   Medication Adjustments/Labs and Tests Ordered: Current medicines are reviewed at length with the patient today.  Concerns regarding medicines are outlined above.  Orders Placed This Encounter  Procedures  . EKG 12-Lead   No orders of the defined types were placed in this encounter.   Patient Instructions  Medication Instructions:  Your Physician recommend you continue on your current medication as directed.    *If you need a refill on your cardiac medications before your next appointment, please call your pharmacy*   Lab Work: None   Testing/Procedures: None   Follow-Up: At [facility], you and your health needs are our priority.  As part of our continuing mission to provide you with exceptional heart care, we have created designated Provider Care Teams.  These Care Teams include your primary Cardiologist (physician) and Advanced Practice Providers (APPs -  Physician Assistants and Nurse Practitioners) who all work together to provide you with the care you need, when you need it.  We recommend signing up for the patient portal called "MyChart".  Sign up information is provided on this After Visit Summary.  MyChart is used to connect with patients for Virtual Visits (Telemedicine).  Patients are able to view lab/test results, encounter notes, upcoming appointments, etc.  Non-urgent messages can be sent to your provider as well.   To learn more about what  you can do with MyChart, go to [email or web address].    Your next appointment:   2-3 week(s)  The format for your next appointment:   Virtual Visit   Provider:   Buford Dresser, MD      Signed, Buford Dresser, MD PhD 08/22/2019   [place]

## 2019-09-03 ENCOUNTER — Telehealth: Payer: Self-pay

## 2019-09-03 NOTE — Telephone Encounter (Signed)
[facility] contacted to initiate hospice care. Nurse informed that referral was placed and someone from their office would contact pt.

## 2019-09-05 ENCOUNTER — Telehealth: Payer: Self-pay

## 2019-09-05 DIAGNOSIS — J8410 Pulmonary fibrosis, unspecified: Secondary | ICD-10-CM | POA: Diagnosis not present

## 2019-09-05 NOTE — Telephone Encounter (Signed)
Telephone call to patient to schedule palliative care visit with patient. Patient/family in agreement with home visit on 10/08/19 at 1:00PM

## 2019-09-09 ENCOUNTER — Telehealth: Payer: Self-pay | Admitting: Pulmonary Disease

## 2019-09-09 NOTE — Telephone Encounter (Signed)
Spoke with patient regarding prior message. I advised patient I would call [employer] to get the correct clarification for O2. Was on hold and transferred to billing and phone just rang.Will attempt to try again at a later time

## 2019-09-10 NOTE — Telephone Encounter (Signed)
[employer] will need to be called to see what they are needing from [place] so that patient is able to get oxygen. It is after 5

## 2019-09-11 NOTE — Telephone Encounter (Signed)
Called and spoke to [employer] and was advised the pt needs an office visit to process the O2. Called pt and left a vm.

## 2019-09-11 NOTE — Telephone Encounter (Signed)
Patient is returning phone call. Patient phone number is [phone].

## 2019-09-11 NOTE — Telephone Encounter (Signed)
Spoke with pt, aware of recs.  Scheduled pt for an OV on 9/8 with TP.  Nothing further needed at this time- will close encounter.

## 2019-09-16 ENCOUNTER — Ambulatory Visit: Payer: PPO | Admitting: Adult Health

## 2019-09-18 ENCOUNTER — Telehealth (INDEPENDENT_AMBULATORY_CARE_PROVIDER_SITE_OTHER): Payer: PPO | Admitting: Cardiology

## 2019-09-18 DIAGNOSIS — Z7189 Other specified counseling: Secondary | ICD-10-CM

## 2019-09-18 DIAGNOSIS — J8410 Pulmonary fibrosis, unspecified: Secondary | ICD-10-CM | POA: Diagnosis not present

## 2019-09-18 DIAGNOSIS — R600 Localized edema: Secondary | ICD-10-CM

## 2019-09-18 DIAGNOSIS — I4891 Unspecified atrial fibrillation: Secondary | ICD-10-CM

## 2019-09-18 DIAGNOSIS — R0602 Shortness of breath: Secondary | ICD-10-CM | POA: Diagnosis not present

## 2019-09-18 MED ORDER — DILTIAZEM HCL ER COATED BEADS 120 MG PO CP24: 120.0000 mg | ORAL_CAPSULE | Freq: Every day | ORAL | 3 refills | Status: DC

## 2019-09-18 NOTE — Patient Instructions (Signed)
Medication Instructions:  Start Diltiazem 120 mg daily  *If you need a refill on your cardiac medications before your next appointment, please call your pharmacy*   Lab Work: None ordered    Testing/Procedures: None ordered    Follow-Up: At [facility], you and your health needs are our priority.  As part of our continuing mission to provide you with exceptional heart care, we have created designated Provider Care Teams.  These Care Teams include your primary Cardiologist (physician) and Advanced Practice Providers (APPs -  Physician Assistants and Nurse Practitioners) who all work together to provide you with the care you need, when you need it.  We recommend signing up for the patient portal called "MyChart".  Sign up information is provided on this After Visit Summary.  MyChart is used to connect with patients for Virtual Visits (Telemedicine).  Patients are able to view lab/test results, encounter notes, upcoming appointments, etc.  Non-urgent messages can be sent to your provider as well.   To learn more about what you can do with MyChart, go to [email or web address].    Your next appointment:   6-8 week(s)  The format for your next appointment:   In Person  Provider:   Buford Dresser, MD

## 2019-09-18 NOTE — Progress Notes (Signed)
Virtual Visit via Telephone Note   This visit type was conducted due to national recommendations for restrictions regarding the COVID-19 Pandemic (e.g. social distancing) in an effort to limit this patient's exposure and mitigate transmission in our community.  Due to her co-morbid illnesses, this patient is at least at moderate risk for complications without adequate follow up.  This format is felt to be most appropriate for this patient at this time.  The patient did not have access to video technology/had technical difficulties with video requiring transitioning to audio format only (telephone).  All issues noted in this document were discussed and addressed.  No physical exam could be performed with this format.  Please refer to the patient's chart for her  consent to telehealth for [facility].   The patient was identified using 2 identifiers.  Patient Location: Home Provider Location: Home Office  Date:  09/18/2019   ID:  Virginia Bullock, DOB 31-Aug-1928, MRN [id number]  PCP:  Harlan Stains, MD  Cardiologist:  Buford Dresser, MD  Referring MD: Harlan Stains, MD   CC: follow up  History of Present Illness:    Virginia Bullock is a 86 y.o. female with a hx of mild MR, mild-moderate AR, pulmonary fibrosis, paroxysmal atrial fibrillation, chronic anemia. I met her 05/29/19. She was seen 06/05/18 by Dr. Margaretann Loveless for shortness of breath/cough, and prior to this she had been followed by Dr. Wynonia Lawman. She was last seen in the hospital 01/2019 for atrial fibrillation with RVR during an admission 2/2 L2 compression fracture from mechanical fall.   Today: Has appt scheduled 10/08/19 at home to discuss palliative care. She is frustrated that there was difficulty in communicating, and her son was contacted as her emergency contact. She was very upset as she hadn't discussed with her son yet, but her son is supportive and wants her to discuss options.  gerd improved, can eat anything she  wants now. Eating salad for the first time in over a year. Working on expanding her diet.  Feels O2 is very helpful, takes diltiazem with improvement. Feels better as the day goes on. Sleeping well. Swelling is greatly reduced, taking daily lasix now.   Had a family visit last weekend, was good but fatigued after.  On iron, Vitamin c, vitamin d.   We spent significant time today discussing her symptoms and overall goals. She prefers to avoid medication as much as possible.  Denies chest pain. No PND, orthopnea, or unexpected weight gain. No syncope or palpitations.   Past Medical History:  Diagnosis Date  . Arthritis    right hip  . Basal cell carcinoma of skin 08/03/1975   right side of forehead  . BCC (basal cell carcinoma of skin) 07/06/1975   left side upper back  . BCC (basal cell carcinoma of skin) 11/08/1990   left nose bulb area tx cx3 81f  . BCC (basal cell carcinoma of skin) 07/13/1993   ant front of shoulder  . BCC (basal cell carcinoma of skin) 07/13/1993   upper right chest  . BCC (basal cell carcinoma of skin) 08/28/1997   right upper arm shoulder tx cx3 589f . BCC (basal cell carcinoma of skin) 08/28/1997   right chest  . BCC (basal cell carcinoma of skin) 09/25/2000   upper mid back tx cx3 [id number]. BCC (basal cell carcinoma of skin) 04/24/2002   mid upper back tx cx3 60fu67f BCC (basal cell carcinoma of skin) 02/21/2005   upper  right post arm tx cx3 68f  . BCC (basal cell carcinoma of skin) 06/12/2006   over left lip   . BCC (basal cell carcinoma of skin) 03/23/2005   right nostril tx cx3 5455f . BCC (basal cell carcinoma of skin) 05/30/2016   left shin tx mohs  . BCC (basal cell carcinoma of skin) 09/25/2016   right nostril tx mohs  . Complication of anesthesia    "epidural with 5th baby-stopped breathing and went numb, 'died'"  . Diverticulosis of colon without hemorrhage 63/19/2016  . Goiter   . History of herpes zoster 02/27/2014  . History of shingles     x2  . Hypothyroidism   . Impaired hearing   . Loss of hearing 02/27/2014   both  . Migraine headache 02/27/2014   migraines, aura without pain  . OA (osteoarthritis) of hip 02/20/2014  . Osteopenia 02/27/2014  . Post-nasal drip   . SCCA (squamous cell carcinoma) of skin 08/13/2017   right chest tx with bx  . SCCA (squamous cell carcinoma) of skin 10/08/2017   right chest tx with bx  . SCCA (squamous cell carcinoma) of skin 09/15/2000   left chest tx cx3 [id number]. SCCA (squamous cell carcinoma) of skin 08/26/2007   V of neck/chest   . SCCA (squamous cell carcinoma) of skin 01/07/2013   left upper arm tx with bx  . SCCA (squamous cell carcinoma) of skin 05/07/2013   left back shoulder tx with bx  . SCCA (squamous cell carcinoma) of skin 03/24/2015   right chest tx cx3 [id number] Past Surgical History:  Procedure Laterality Date  . ABDOMINAL HYSTERECTOMY  2011   partial  . BASAL CELL CARCINOMA EXCISION    . CERVICAL SPINE SURGERY  24 years ago   2 ruptured disc  . EYE SURGERY Bilateral    lens replacements for cataract  . INNER EAR SURGERY Bilateral as child   double mastoid  . KNEE ARTHROSCOPY Left 30 years ago   "born with knee cap problem"  . MASTOIDECTOMY Bilateral    as a baby  . NASAL SEPTUM SURGERY    . TONSILLECTOMY  age [age]  . TOTAL HIP ARTHROPLASTY Right 02/20/2014   Procedure: RIGHT TOTAL HIP ARTHROPLASTY ANTERIOR APPROACH;  Surgeon: FranGearlean Alf;  Location: [facility];  Service: Orthopedics;  Laterality: Right;  . TOTAL HIP ARTHROPLASTY Left 12/23/2014   Procedure: LEFT TOTAL HIP ARTHROPLASTY ANTERIOR APPROACH;  Surgeon: FranGaynelle Arabian;  Location: [facility];  Service: Orthopedics;  Laterality: Left;    Current Medications: Current Outpatient Medications on File Prior to Visit  Medication Sig  . acetaminophen (TYLENOL) 325 MG tablet Take 650 mg by mouth every 6 (six) hours as needed.  . diltiazem (CARDIZEM) 30 MG tablet Take 1 tablet (30 mg total) by  mouth 4 (four) times daily as needed (for when heart rate is fast).  . furosemide (LASIX) 40 MG tablet Take 40 mg by mouth. May take additional dose for swelling  . lactulose (CHRONULAC) 10 GM/15ML solution Take 30 mLs (20 g total) by mouth 2 (two) times daily as needed for mild constipation or moderate constipation.  . pantoprazole (PROTONIX) 40 MG tablet Take 40 mg by mouth 2 (two) times daily.    No current facility-administered medications on file prior to visit.     Allergies:   Benadryl [diphenhydramine hcl], Codeine, Coumadin [warfarin sodium], and Demerol [meperidine]   Social History  Tobacco Use  . Smoking status: Former Smoker    Types: Cigarettes  . Smokeless tobacco: Never Used  . Tobacco comment: quit 25 years ago  Substance Use Topics  . Alcohol use: Yes    Comment: glass of wine daily  . Drug use: No    Family History: family history includes Cancer in her maternal grandmother; Tuberculosis in her maternal grandfather.  ROS:   Please see the history of present illness.  Additional pertinent ROS otherwise unremarkable.  EKGs/Labs/Other Studies Reviewed:    The following studies were reviewed today: Echo 06/24/2018 1. The left ventricle has normal systolic function, with an ejection  fraction of 55-60%. The cavity size was normal. Left ventricular diastolic  parameters were normal.  2. The right ventricle has normal systolic function. The cavity was  normal. There is no increase in right ventricular wall thickness.  3. Left atrial size was mildly dilated.  4. The mitral valve is degenerative. Moderate thickening of the mitral  valve leaflet. Moderate calcification of the mitral valve leaflet. There  is moderate mitral annular calcification present. Mitral valve  regurgitation is moderate by color flow  Doppler.  5. Tricuspid valve regurgitation is mild-moderate.  6. The aortic valve is tricuspid. Moderate thickening of the aortic  valve. Sclerosis  without any evidence of stenosis of the aortic valve.  Aortic valve regurgitation is mild by color flow Doppler.   EKG:  EKG is personally reviewed.  The ekg ordered 08/25/19 demonstrates atrial fibrillation with RVR at 128 bpm, PVC, nonspecific ST changes  Recent Labs: 01/22/2019: TSH 0.542 01/25/2019: Magnesium 1.6 01/27/2019: ALT 14; BUN 26; Creatinine, Ser 0.63; Potassium 4.2; Sodium 135 01/29/2019: Hemoglobin 8.5; Platelets 253  Recent Lipid Panel No results found for: CHOL, TRIG, HDL, CHOLHDL, VLDL, LDLCALC, LDLDIRECT  Physical Exam:    VS:  There were no vitals taken for this visit.    Wt Readings from Last 3 Encounters:  08/22/19 128 lb (58.1 kg)  05/29/19 126 lb 12.8 oz (57.5 kg)  03/25/19 122 lb 12.8 oz (55.7 kg)  Speaking comfortably on the phone, slightly conversationally dyspneic In no acute distress Alert and oriented Normal affect Normal speech  ASSESSMENT:    1. Atrial fibrillation with RVR ([place])   2. SOB (shortness of breath)   3. Pulmonary fibrosis ([place])   4. Bilateral leg edema   5. Goals of care, counseling/discussion    PLAN:    Atrial fibrillation with RVR: CHA2DS2/VAS Stroke Risk Points= 3 Shortness of breath, pulmonary fibrosis Bilateral LE edema -she feels somewhat improved, though still audibly short of breath on my interview -has pending visit with palliative care -she again reaffirms that she wants to focus on quality of life, as well as minimizing medications -has been on and off diltiazem, furosemide. Currently taking both. Discussed trialing long acting diltiazem at a low dose, she is willing to try  We again had a conversation about what is and isn't likely to be improved. The afib does not bother her. Her breathing feels better on O2. She wants to spend time with family and friends, and beyond that she wants to just feel as good as she can.  Plan for follow up: 6-8 weeks or sooner as needed  Today, I have spent 28 minutes with the patient  with telehealth technology discussing the above problems.  Additional time spent in chart review, documentation, and communication.  Buford Dresser, MD, PhD [place]  [facility]   Medication Adjustments/Labs and Tests Ordered: Current  medicines are reviewed at length with the patient today.  Concerns regarding medicines are outlined above.  No orders of the defined types were placed in this encounter.  Meds ordered this encounter  Medications  . diltiazem (CARDIZEM CD) 120 MG 24 hr capsule    Sig: Take 1 capsule (120 mg total) by mouth daily.    Dispense:  90 capsule    Refill:  3    Patient Instructions  Medication Instructions:  Start Diltiazem 120 mg daily  *If you need a refill on your cardiac medications before your next appointment, please call your pharmacy*   Lab Work: None ordered    Testing/Procedures: None ordered    Follow-Up: At [facility], you and your health needs are our priority.  As part of our continuing mission to provide you with exceptional heart care, we have created designated Provider Care Teams.  These Care Teams include your primary Cardiologist (physician) and Advanced Practice Providers (APPs -  Physician Assistants and Nurse Practitioners) who all work together to provide you with the care you need, when you need it.  We recommend signing up for the patient portal called "MyChart".  Sign up information is provided on this After Visit Summary.  MyChart is used to connect with patients for Virtual Visits (Telemedicine).  Patients are able to view lab/test results, encounter notes, upcoming appointments, etc.  Non-urgent messages can be sent to your provider as well.   To learn more about what you can do with MyChart, go to [email or web address].    Your next appointment:   6-8 week(s)  The format for your next appointment:   In Person  Provider:   Buford Dresser, MD      Signed, Buford Dresser, MD  PhD 09/18/2019   [place]

## 2019-09-22 ENCOUNTER — Other Ambulatory Visit: Payer: Self-pay

## 2019-09-22 DIAGNOSIS — D509 Iron deficiency anemia, unspecified: Secondary | ICD-10-CM | POA: Diagnosis not present

## 2019-09-22 DIAGNOSIS — I4891 Unspecified atrial fibrillation: Secondary | ICD-10-CM

## 2019-09-22 MED ORDER — DILTIAZEM HCL 30 MG PO TABS: 30.0000 mg | ORAL_TABLET | Freq: Four times a day (QID) | ORAL | 11 refills | Status: DC | PRN

## 2019-09-23 ENCOUNTER — Ambulatory Visit: Payer: PPO | Admitting: Adult Health

## 2019-09-29 ENCOUNTER — Other Ambulatory Visit: Payer: Self-pay

## 2019-09-29 ENCOUNTER — Ambulatory Visit (INDEPENDENT_AMBULATORY_CARE_PROVIDER_SITE_OTHER): Payer: PPO | Admitting: Adult Health

## 2019-09-29 ENCOUNTER — Encounter: Payer: Self-pay | Admitting: Adult Health

## 2019-09-29 VITALS — BP 88/52 | HR 82 | Temp 97.0°F | Ht 62.0 in | Wt 119.4 lb

## 2019-09-29 DIAGNOSIS — J8410 Pulmonary fibrosis, unspecified: Secondary | ICD-10-CM

## 2019-09-29 DIAGNOSIS — J9611 Chronic respiratory failure with hypoxia: Secondary | ICD-10-CM | POA: Diagnosis not present

## 2019-09-29 DIAGNOSIS — Z23 Encounter for immunization: Secondary | ICD-10-CM | POA: Diagnosis not present

## 2019-09-29 DIAGNOSIS — R0902 Hypoxemia: Secondary | ICD-10-CM | POA: Diagnosis not present

## 2019-09-29 NOTE — Patient Instructions (Addendum)
Flu shot today  Continue on Oxygen 2l/m , goal is to keep oxygen >88-90%.  Activity as tolerated.  Follow up with Dr. Ander Slade in 6 months and As needed

## 2019-09-29 NOTE — Progress Notes (Signed)
@Patient  ID: Virginia Bullock, female    DOB: 06/10/1928, 86 y.o.   MRN: [id number]  Chief Complaint  Patient presents with  . Follow-up    PF    Referring provider: Harlan Stains, MD  HPI: 86 year old female followed for pulmonary fibrosis   Medical history significant for A Fib and chronic anemia   TEST/EVENTS :  High-resolution CT chest August 20, 2018 showed findings compatible with fibrotic interstitial lung disease with slight basilar predominance with mild honeycombing consistent with UIP.  2 D echo EF 55-60%, degenerative MV, mod thickening and calfification, regurgitation, Mild-mod TV   09/29/2019 Follow up : Pulmonary Fibrosis  Patient presents for a 73-month follow-up.  Patient has underlying pulmonary fibrosis.  She says overall breathing is doing about the same. Has mild dry cough. No increased dyspnea or decrease in activity tolerance.  Previously was on low dose prednisone , weaned off slowly earlier this year.  Feels she is better since last visit .   Was started on Oxygen 2l/m in July, wears oxygen with activity . Can take off at rest. Sleep with it .  Feels it really helps with dyspnea, feels much better since starting.  Walk test in office today on room air, O2 sats 87%. Requires 2l/m to keep sats >90%.   Is independent , still drives. Knitting today in office. Lives alone, has good family and friends. Has 2 story house, is able to go up the stairs independently.    Allergies  Allergen Reactions  . Benadryl [Diphenhydramine Hcl] Anxiety    Panic attack  . Codeine Nausea And Vomiting  . Coumadin [Warfarin Sodium]     Severe rash  . Demerol [Meperidine] Nausea And Vomiting    Immunization History  Administered Date(s) Administered  . Influenza, High Dose Seasonal PF 08/22/2018  . Influenza,inj,quad, With Preservative 01/09/2018  . PFIZER SARS-COV-2 Vaccination 01/29/2019, 02/19/2019  . Pneumococcal Polysaccharide-23 08/22/2018  . Zoster Recombinat  (Shingrix) 08/22/2018, 11/25/2018    Past Medical History:  Diagnosis Date  . Arthritis    right hip  . Basal cell carcinoma of skin 08/03/1975   right side of forehead  . BCC (basal cell carcinoma of skin) 07/06/1975   left side upper back  . BCC (basal cell carcinoma of skin) 11/08/1990   left nose bulb area tx cx3 48fu  . BCC (basal cell carcinoma of skin) 07/13/1993   ant front of shoulder  . BCC (basal cell carcinoma of skin) 07/13/1993   upper right chest  . BCC (basal cell carcinoma of skin) 08/28/1997   right upper arm shoulder tx cx3 23fu  . BCC (basal cell carcinoma of skin) 08/28/1997   right chest  . BCC (basal cell carcinoma of skin) 09/25/2000   upper mid back tx cx3 16fu  . BCC (basal cell carcinoma of skin) 04/24/2002   mid upper back tx cx3 57fu  . BCC (basal cell carcinoma of skin) 02/21/2005   upper right post arm tx cx3 65fu  . BCC (basal cell carcinoma of skin) 06/12/2006   over left lip   . BCC (basal cell carcinoma of skin) 03/23/2005   right nostril tx cx3 29fu  . BCC (basal cell carcinoma of skin) 05/30/2016   left shin tx mohs  . BCC (basal cell carcinoma of skin) 09/25/2016   right nostril tx mohs  . Complication of anesthesia    "epidural with 5th baby-stopped breathing and went numb, 'died'"  . Diverticulosis of colon without hemorrhage 02/27/2014  .  Goiter   . History of herpes zoster 02/27/2014  . History of shingles    x2  . Hypothyroidism   . Impaired hearing   . Loss of hearing 02/27/2014   both  . Migraine headache 02/27/2014   migraines, aura without pain  . OA (osteoarthritis) of hip 02/20/2014  . Osteopenia 02/27/2014  . Post-nasal drip   . SCCA (squamous cell carcinoma) of skin 08/13/2017   right chest tx with bx  . SCCA (squamous cell carcinoma) of skin 10/08/2017   right chest tx with bx  . SCCA (squamous cell carcinoma) of skin 09/15/2000   left chest tx cx3 85fu  . SCCA (squamous cell carcinoma) of skin 08/26/2007   V of  neck/chest   . SCCA (squamous cell carcinoma) of skin 01/07/2013   left upper arm tx with bx  . SCCA (squamous cell carcinoma) of skin 05/07/2013   left back shoulder tx with bx  . SCCA (squamous cell carcinoma) of skin 03/24/2015   right chest tx cx3 49fu    Tobacco History: Social History   Tobacco Use  Smoking Status Former Smoker  . Types: Cigarettes  Smokeless Tobacco Never Used  Tobacco Comment   quit 25 years ago   Counseling given: Not Answered Comment: quit 25 years ago   Outpatient Medications Prior to Visit  Medication Sig Dispense Refill  . acetaminophen (TYLENOL) 325 MG tablet Take 650 mg by mouth every 6 (six) hours as needed.    . diltiazem (CARDIZEM CD) 120 MG 24 hr capsule Take 1 capsule (120 mg total) by mouth daily. 90 capsule 3  . diltiazem (CARDIZEM) 30 MG tablet Take 1 tablet (30 mg total) by mouth 4 (four) times daily as needed (for when heart rate is fast). 30 tablet 11  . furosemide (LASIX) 40 MG tablet Take 40 mg by mouth. May take additional dose for swelling    . lactulose (CHRONULAC) 10 GM/15ML solution Take 30 mLs (20 g total) by mouth 2 (two) times daily as needed for mild constipation or moderate constipation. 236 mL 0  . pantoprazole (PROTONIX) 40 MG tablet Take 40 mg by mouth 2 (two) times daily.      No facility-administered medications prior to visit.     Review of Systems:   Constitutional:   No  weight loss, night sweats,  Fevers, chills, fatigue, or  lassitude.  HEENT:   No headaches,  Difficulty swallowing,  Tooth/dental problems, or  Sore throat,                No sneezing, itching, ear ache, nasal congestion, post nasal drip,   CV:  No chest pain,  Orthopnea, PND, +swelling in lower extremities, anasarca, dizziness, palpitations, syncope.   GI  No heartburn, indigestion, abdominal pain, nausea, vomiting, diarrhea, change in bowel habits, loss of appetite, bloody stools.   Resp:    No chest wall deformity  Skin: no rash or  lesions.  GU: no dysuria, change in color of urine, no urgency or frequency.  No flank pain, no hematuria   MS:  No joint pain or swelling.  No decreased range of motion.  No back pain.    Physical Exam  BP (!) 88/52 (BP Location: Left Arm, Cuff Size: Normal)   Pulse 82   Temp (!) 97 F (36.1 C) (Temporal)   Ht 5\' 2"  (1.575 m)   Wt 119 lb 6.4 oz (54.2 kg)   SpO2 100% Comment: RA  BMI 21.84 kg/m  GEN: A/Ox3; pleasant , NAD, elderly    HEENT:  [place]/AT,    NOSE-clear, THROAT-clear, no lesions, no postnasal drip or exudate noted.   NECK:  Supple w/ fair ROM; no JVD; normal carotid impulses w/o bruits; no thyromegaly or nodules palpated; no lymphadenopathy.    RESP  BB crackles. no accessory muscle use, no dullness to percussion  CARD:  RRR, no m/r/g, 1+ peripheral edema, pulses intact, no cyanosis or clubbing.  GI:   Soft & nt; nml bowel sounds; no organomegaly or masses detected.   Musco: Warm bil, no deformities or joint swelling noted.   Neuro: alert, no focal deficits noted.    Skin: Warm, no lesions or rashes    Lab Results: No results found for: PROBNP  Imaging: No results found.    PFT Results Latest Ref Rng & Units 10/10/2018  FVC-Pre L 1.77  FVC-Predicted Pre % 88  Pre FEV1/FVC % % 79  FEV1-Pre L 1.39  FEV1-Predicted Pre % 95    No results found for: NITRICOXIDE      Assessment & Plan:   Pulmonary fibrosis (HCC) Appears stable - remains active and independent   Plan  Patient Instructions  Flu shot today  Continue on Oxygen 2l/m , goal is to keep oxygen >88-90%.  Activity as tolerated.  Follow up with Dr. Ander Slade in 6 months and As needed        Chronic respiratory failure with hypoxia ([place]) Continue on O2 to keep sats >88-90%.   Plan  Patient Instructions  Flu shot today  Continue on Oxygen 2l/m , goal is to keep oxygen >88-90%.  Activity as tolerated.  Follow up with Dr. Ander Slade in 6 months and As needed      '     Marlene Pfluger, NP 09/29/2019

## 2019-09-29 NOTE — Assessment & Plan Note (Signed)
Continue on O2 to keep sats >88-90%.   Plan  Patient Instructions  Flu shot today  Continue on Oxygen 2l/m , goal is to keep oxygen >88-90%.  Activity as tolerated.  Follow up with Dr. Ander Slade in 6 months and As needed      '

## 2019-09-29 NOTE — Assessment & Plan Note (Signed)
Appears stable - remains active and independent   Plan  Patient Instructions  Flu shot today  Continue on Oxygen 2l/m , goal is to keep oxygen >88-90%.  Activity as tolerated.  Follow up with Dr. Ander Slade in 6 months and As needed

## 2019-10-06 DIAGNOSIS — J8410 Pulmonary fibrosis, unspecified: Secondary | ICD-10-CM | POA: Diagnosis not present

## 2019-10-08 ENCOUNTER — Other Ambulatory Visit: Payer: Self-pay

## 2019-10-08 ENCOUNTER — Other Ambulatory Visit: Payer: PPO

## 2019-10-08 ENCOUNTER — Other Ambulatory Visit: Payer: PPO | Admitting: *Deleted

## 2019-10-08 VITALS — BP 84/63 | HR 84 | Temp 97.6°F | Resp 20

## 2019-10-08 DIAGNOSIS — Z515 Encounter for palliative care: Secondary | ICD-10-CM

## 2019-10-08 NOTE — Progress Notes (Signed)
COMMUNITY PALLIATIVE CARE SW NOTE  PATIENT NAME: Virginia Bullock DOB: [birth year] MRN: [id number]  PRIMARY CARE PROVIDER: Harlan Stains, MD  RESPONSIBLE PARTY:  Acct ID - Guarantor Home Phone Work Phone Relationship Acct Type  [id number] Virginia [phone]  Self P/F     [name], [place] Bullock, [place] Bullock [address]     PLAN OF CARE and INTERVENTIONS:             1. GOALS OF CARE/ ADVANCE CARE PLANNING: Virginia Bullock is for patient to maintain her independence. Patient is a FULL CODE. 2. SOCIAL/EMOTIONAL/SPIRITUAL ASSESSMENT/INTERVENTIONS:   SW and RN-Virginia Bullock completed initial palliative care visit with patient at her home. Her son-Virginia Bullock was present with her.  The team provided education about the palliative care program, services, and visit frequency. Patient provided verbal consent to services. Patient continues to drives, takes herself to her doctor's appointment and to run errands. Patient had a fall in her home and obtained a compression fractured her vertebrae. Patient went to hospital, ICU, for a week and found issues with her heart. Patient is active in her home and do stretches while in bed to assist with the healing process of her vertebrae. History of low hemoglobin and iron. Patient had reflux, but will that is now better. Patient is on continuous o2 at 2L, but SOB is present. Patient ambulates independently, but is very slowly for fear of falling. Patient has low BP's. Patient has a walker, but does not uses it. Lost 20 lbs. in one month.  Patient is from [place], [place]. Attended college didn't finish. Patient is widowed since 54. Patient have five children. She worked as a Research scientist (physical sciences) for [employer] Patient is of [name]. Patient has supportive friends, neighbors and family. Education provided to regarding insurance, DME billing and community resources. SW provided supportive presence, active listening, assessment of needs and comfort, and reassurance of support. SW  to provide ongoing assessment of psychosocial needs and comfort. 3. PATIENT/CAREGIVER EDUCATION/ COPING:  Patient is alert and oriented x3. She is optimistic about her condition. She seems to be coping well.  4. PERSONAL EMERGENCY PLAN:  911 can be activated for emergencies. 5. COMMUNITY RESOURCES COORDINATION/ HEALTH CARE NAVIGATION:  The team reinforced access to palliative care services and support.  6. FINANCIAL/LEGAL CONCERNS/INTERVENTIONS:  None.     SOCIAL HX:  Social History   Tobacco Use  . Smoking status: Former Smoker    Types: Cigarettes  . Smokeless tobacco: Never Used  . Tobacco comment: quit 25 years ago  Substance Use Topics  . Alcohol use: Yes    Comment: glass of wine daily    CODE STATUS: FULL CODE ADVANCED DIRECTIVES: To be assessed MOST FORM COMPLETE:  To be assessed HOSPICE EDUCATION PROVIDED: Yes, explained difference in hospice and palliative care  PPS: Patient is alert and oriented x3. She ambulates independently. She is on continuous 02. She is independent of her ADL's.   Duration of visit and documentation: 60 minutes.      [address] [place], [place]

## 2019-10-08 NOTE — Progress Notes (Signed)
COMMUNITY PALLIATIVE CARE RN NOTE  PATIENT NAME: Virginia Bullock DOB: [birth year] MRN: [id number]  PRIMARY CARE PROVIDER: Harlan Stains, MD  RESPONSIBLE PARTY: Ayesha Rumpf (son) Acct ID - Guarantor Home Phone Work Phone Relationship Acct Type  [id number] [name]  Self P/F     [place], [name], [place] [address]   Covid-19 Pre-screening Negative  PLAN OF CARE and INTERVENTION:  1. ADVANCE CARE PLANNING/GOALS OF CARE: Goal is for patient to remain in her home and avoid hospitalizations.  2. PATIENT/CAREGIVER EDUCATION: Explained Palliative care services, symptom management, edema management, safe mobility and fall prevention 3. DISEASE STATUS: Joint visit made with Palliative care SW, M. Lonon. Met with patient and her son, Virginia Bullock, in her home. She is alert and oriented x 4 and able to provide details regarding her health history. She is hard of hearing and wears hearing aids. She also reports that her vision is blurry at times. She denies pain at this time, but does experience pain in her back from a compression fracture she sustained after a fall. She takes Tylenol which is effective. She says that she remains independent with all ADLs, but when she feels pain in her back, she will then take a break. She also will perform stretching exercises which are helpful. She remains able to drive, but usually goes places where she can remain in her car. She is currently on Oxygen at 2L/min via [place] to keep sats >88-90%. She says that she is able to go without it for about 1-2 hours. She does feel that it has been helpful for her breathing. She does experience mild shortness of breath with lengthy conversation. She is still able to navigate her stairs daily, as her bedroom is upstairs. She does have some bilateral lower extremity edema, but says this has improved. No weeping noted. She is on a diuretic. She says that her BP often times run on the low side but is being monitored. She says that  the goal is not to allow her BP to become elevated d/t her heart condition from her understanding. She is ambulatory without the use of assistive devices. She has a walker but does not feel she needs to utilize it. She says that she must be very careful. Her appetite is improved quite a bit. At one point she says that she was having issues with nausea/vomiting and could not hardly eat anything. Now she is able to eat a variety of things without complications and has been gaining weight. She also reports past history of "awful" symptoms from GERD, but this has also improved. She denies dysphagia. She is continent of both bowel and bladder. One of her biggest concerns is feeling tired more easily. She says that she often falls asleep shortly after she sits down from an activity. She has been vaccinated for Covid-19. She received a flu shot last week. She is agreeable to future visits from Palliative but says that she declined a hospice evaluation as she does not feel she is ready for something like that. Will continue to monitor.  HISTORY OF PRESENT ILLNESS: This is a 86 yo female with a history of pulmonary fibrosis, Afib w/RVR, hypothyroidism and osteoporosis. Palliative care was asked to follow patient for additional support. Will visit patient monthly and PRN.  CODE STATUS: Full Code ADVANCED DIRECTIVES: Living Will MOST FORM: no PPS: 70%   PHYSICAL EXAM:   VITALS: Today's Vitals   10/08/19 1346  BP: (!) 84/63  Pulse: 84  Resp:  20  Temp: 97.6 F (36.4 C)  TempSrc: Temporal  SpO2: 96%  PainSc: 0-No pain    LUNGS: clear to auscultation  CARDIAC: Cor RRR EXTREMITIES: Trace edema to bilateral lower extremities SKIN: Exposed skin is dry and intact  NEURO: Alert and oriented x 4, HOH, generalized weakness, ambulatory    (Duration of visit and documentation 105 minutes)   Daryl Eastern, RN BSN

## 2019-10-16 ENCOUNTER — Ambulatory Visit (INDEPENDENT_AMBULATORY_CARE_PROVIDER_SITE_OTHER): Payer: PPO | Admitting: Cardiology

## 2019-10-16 ENCOUNTER — Encounter: Payer: Self-pay | Admitting: Cardiology

## 2019-10-16 ENCOUNTER — Other Ambulatory Visit: Payer: Self-pay

## 2019-10-16 VITALS — BP 109/73 | HR 120 | Ht 62.0 in | Wt 118.0 lb

## 2019-10-16 DIAGNOSIS — J8410 Pulmonary fibrosis, unspecified: Secondary | ICD-10-CM

## 2019-10-16 DIAGNOSIS — Z7189 Other specified counseling: Secondary | ICD-10-CM

## 2019-10-16 DIAGNOSIS — R0602 Shortness of breath: Secondary | ICD-10-CM

## 2019-10-16 DIAGNOSIS — I4891 Unspecified atrial fibrillation: Secondary | ICD-10-CM | POA: Diagnosis not present

## 2019-10-16 DIAGNOSIS — R5383 Other fatigue: Secondary | ICD-10-CM

## 2019-10-16 NOTE — Progress Notes (Signed)
Cardiology Office Note:    Date:  10/16/2019   ID:  Virginia Bullock, DOB 07-Aug-1928, MRN [id number]  PCP:  Harlan Stains, MD  Cardiologist:  Buford Dresser, MD  Referring MD: Harlan Stains, MD   CC: follow up  History of Present Illness:    Virginia Bullock is a 86 y.o. female with a hx of of mild MR, mild-moderate AR, pulmonary fibrosis, paroxysmal atrial fibrillation, chronic anemia. She has chronic hypoxic respiratory failure on home oxygen for pulmonary fibrosis, and see prior notes, but she wants conservative management focused on symptoms and is followed by palliative care.   Today: Remains very short of breath, had to stop several times while walking into the office today. Uses home O2, has not been able to afford portable oxygen. We got her a tank today during the visit.  We started long acting diltiazem at our televisit. She was falling asleep and easily fatigued. Her BP was as low as 83/50. Stopped diltiazem to see if it would make her feel better, and she did. She did take it yesterday and today and remains in afib.  She again emphasized her preference to be on no medications She understands what afib with RVR is and what it mans. She wants to "feel alive while I'm alive". Has a living will, shared with her family. No ventilators. Wants to use whatever time she may have feeling as good as possible.  Appreciates palliative care, has shared her wishes with her family. Her main wish is to have a portable oxygen tank, this would allow her to travel to visit her family. She barely made it into the office today without one, and we we able to get her on a portable tank here.  We spoke at length today about her goals of care, priorities, what she wants. She has greatly appreciated having family and friends reach out to her.   Past Medical History:  Diagnosis Date  . Arthritis    right hip  . Basal cell carcinoma of skin 08/03/1975   right side of forehead  . BCC (basal  cell carcinoma of skin) 07/06/1975   left side upper back  . BCC (basal cell carcinoma of skin) 11/08/1990   left nose bulb area tx cx3 32fu  . BCC (basal cell carcinoma of skin) 07/13/1993   ant front of shoulder  . BCC (basal cell carcinoma of skin) 07/13/1993   upper right chest  . BCC (basal cell carcinoma of skin) 08/28/1997   right upper arm shoulder tx cx3 41fu  . BCC (basal cell carcinoma of skin) 08/28/1997   right chest  . BCC (basal cell carcinoma of skin) 09/25/2000   upper mid back tx cx3 98fu  . BCC (basal cell carcinoma of skin) 04/24/2002   mid upper back tx cx3 44fu  . BCC (basal cell carcinoma of skin) 02/21/2005   upper right post arm tx cx3 70fu  . BCC (basal cell carcinoma of skin) 06/12/2006   over left lip   . BCC (basal cell carcinoma of skin) 03/23/2005   right nostril tx cx3 35fu  . BCC (basal cell carcinoma of skin) 05/30/2016   left shin tx mohs  . BCC (basal cell carcinoma of skin) 09/25/2016   right nostril tx mohs  . Complication of anesthesia    "epidural with 5th baby-stopped breathing and went numb, 'died'"  . Diverticulosis of colon without hemorrhage 02/27/2014  . Goiter   . History of herpes zoster 02/27/2014  .  History of shingles    x2  . Hypothyroidism   . Impaired hearing   . Loss of hearing 02/27/2014   both  . Migraine headache 02/27/2014   migraines, aura without pain  . OA (osteoarthritis) of hip 02/20/2014  . Osteopenia 02/27/2014  . Post-nasal drip   . SCCA (squamous cell carcinoma) of skin 08/13/2017   right chest tx with bx  . SCCA (squamous cell carcinoma) of skin 10/08/2017   right chest tx with bx  . SCCA (squamous cell carcinoma) of skin 09/15/2000   left chest tx cx3 1fu  . SCCA (squamous cell carcinoma) of skin 08/26/2007   V of neck/chest   . SCCA (squamous cell carcinoma) of skin 01/07/2013   left upper arm tx with bx  . SCCA (squamous cell carcinoma) of skin 05/07/2013   left back shoulder tx with bx  . SCCA (squamous  cell carcinoma) of skin 03/24/2015   right chest tx cx3 45fu    Past Surgical History:  Procedure Laterality Date  . ABDOMINAL HYSTERECTOMY  2011   partial  . BASAL CELL CARCINOMA EXCISION    . CERVICAL SPINE SURGERY  24 years ago   2 ruptured disc  . EYE SURGERY Bilateral    lens replacements for cataract  . INNER EAR SURGERY Bilateral as child   double mastoid  . KNEE ARTHROSCOPY Left 30 years ago   "born with knee cap problem"  . MASTOIDECTOMY Bilateral    as a baby  . NASAL SEPTUM SURGERY    . TONSILLECTOMY  age 64 and 43   x2  . TOTAL HIP ARTHROPLASTY Right 02/20/2014   Procedure: RIGHT TOTAL HIP ARTHROPLASTY ANTERIOR APPROACH;  Surgeon: Gearlean Alf, MD;  Location: [facility];  Service: Orthopedics;  Laterality: Right;  . TOTAL HIP ARTHROPLASTY Left 12/23/2014   Procedure: LEFT TOTAL HIP ARTHROPLASTY ANTERIOR APPROACH;  Surgeon: Gaynelle Arabian, MD;  Location: [facility];  Service: Orthopedics;  Laterality: Left;    Current Medications: Current Outpatient Medications on File Prior to Visit  Medication Sig  . acetaminophen (TYLENOL) 325 MG tablet Take 650 mg by mouth every 6 (six) hours as needed.  . furosemide (LASIX) 40 MG tablet Take 40 mg by mouth. May take additional dose for swelling  . pantoprazole (PROTONIX) 40 MG tablet Take 40 mg by mouth 2 (two) times daily.    No current facility-administered medications on file prior to visit.     Allergies:   Benadryl [diphenhydramine hcl], Codeine, Coumadin [warfarin sodium], and Demerol [meperidine]   Social History   Tobacco Use  . Smoking status: Former Smoker    Types: Cigarettes  . Smokeless tobacco: Never Used  . Tobacco comment: quit 25 years ago  Substance Use Topics  . Alcohol use: Yes    Comment: glass of wine daily  . Drug use: No    Family History: family history includes Cancer in her maternal grandmother; Tuberculosis in her maternal grandfather.  ROS:   Please see the history of present illness.   Additional pertinent ROS otherwise unremarkable.  EKGs/Labs/Other Studies Reviewed:    The following studies were reviewed today: Echo 06/24/2018 1. The left ventricle has normal systolic function, with an ejection  fraction of 55-60%. The cavity size was normal. Left ventricular diastolic  parameters were normal.  2. The right ventricle has normal systolic function. The cavity was  normal. There is no increase in right ventricular wall thickness.  3. Left atrial size was mildly dilated.  4.  The mitral valve is degenerative. Moderate thickening of the mitral  valve leaflet. Moderate calcification of the mitral valve leaflet. There  is moderate mitral annular calcification present. Mitral valve  regurgitation is moderate by color flow  Doppler.  5. Tricuspid valve regurgitation is mild-moderate.  6. The aortic valve is tricuspid. Moderate thickening of the aortic  valve. Sclerosis without any evidence of stenosis of the aortic valve.  Aortic valve regurgitation is mild by color flow Doppler.   EKG:  EKG is personally reviewed.  The ekg ordered today demonstrates atrial fibrillation with RVR at 120 bpm  Recent Labs: 01/22/2019: TSH 0.542 01/25/2019: Magnesium 1.6 01/27/2019: ALT 14; BUN 26; Creatinine, Ser 0.63; Potassium 4.2; Sodium 135 01/29/2019: Hemoglobin 8.5; Platelets 253  Recent Lipid Panel No results found for: CHOL, TRIG, HDL, CHOLHDL, VLDL, LDLCALC, LDLDIRECT  Physical Exam:    VS:  BP 109/73   Pulse (!) 120   Ht 5\' 2"  (1.575 m)   Wt 118 lb (53.5 kg)   SpO2 97%   BMI 21.58 kg/m     Wt Readings from Last 3 Encounters:  10/16/19 118 lb (53.5 kg)  09/29/19 119 lb 6.4 oz (54.2 kg)  08/22/19 128 lb (58.1 kg)    GEN: frail appearing, conversationally dyspneic HEENT: Normal, moist mucous membranes NECK: JVD just above clavicle CARDIAC: regular rhythm, normal S1 and S2, no rubs or gallops. No murmur. VASCULAR: Radial and DP pulses 2+ bilaterally. No carotid  bruits RESPIRATORY:  Distant, fine rales throughout. Conversationally dyspneic ABDOMEN: Soft, non-tender, non-distended MUSCULOSKELETAL:  Ambulates independently SKIN: Warm and dry, bilateral 1+ lower extremity edema NEUROLOGIC:  Alert and oriented x 3. No focal neuro deficits noted. PSYCHIATRIC:  Normal affect   ASSESSMENT:    1. Atrial fibrillation with RVR ([place])   2. Pulmonary fibrosis ([place])   3. Goals of care, counseling/discussion   4. SOB (shortness of breath)   5. Fatigue, unspecified type    PLAN:    Atrial fibrillation with RVR: -CHA2DS2/VAS Stroke Risk Points= 3 -we have done shared decision making extensively. She wants to minimize medications. We will stop diltiazem. She understands that she is already in RVR and this may get worse. We discussed that RVR may be contributing to edema. We have previously discussed anticoagulation, and she declines. -in accordance with her wishes, will stop diltiazem.   Lower extremity edema: -worse today, but reports it does improve with lasix -continue lasix -she will contact me if this worsens. -will not check echo, as it would not change management, per her wishes.  Chronic hypoxic respiratory failure, on home O2 -working on getting portable O2 tank, as this would greatly improve her quality of life -extensive goals of care conversations. She has a living will, looking forward to working more with palliative care. Her family and friends are on board with her wishes  Plan for follow up: 3 mos or sooner as needed  Total time of encounter: 83 minutes total time of encounter, including 75 minutes spent in face-to-face patient care. This time includes coordination of care and counseling regarding goals of care. Remainder of non-face-to-face time involved reviewing chart documents/testing relevant to the patient encounter and documentation in the medical record.  Buford Dresser, MD, PhD [place]  [facility]   Medication  Adjustments/Labs and Tests Ordered: Current medicines are reviewed at length with the patient today.  Concerns regarding medicines are outlined above.  Orders Placed This Encounter  Procedures  . EKG 12-Lead   No orders of  the defined types were placed in this encounter.   Patient Instructions  Medication Instructions:  No Changes In Medications at this time.  *If you need a refill on your cardiac medications before your next appointment, please call your pharmacy*  Lab Work: None Ordered At This Time.  If you have labs (blood work) drawn today and your tests are completely normal, you will receive your results only by: Marland Kitchen MyChart Message (if you have MyChart) OR . A paper copy in the mail If you have any lab test that is abnormal or we need to change your treatment, we will call you to review the results.  Testing/Procedures: None Ordered At This Time.   Follow-Up: At [facility], you and your health needs are our priority.  As part of our continuing mission to provide you with exceptional heart care, we have created designated Provider Care Teams.  These Care Teams include your primary Cardiologist (physician) and Advanced Practice Providers (APPs -  Physician Assistants and Nurse Practitioners) who all work together to provide you with the care you need, when you need it.  We recommend signing up for the patient portal called "MyChart".  Sign up information is provided on this After Visit Summary.  MyChart is used to connect with patients for Virtual Visits (Telemedicine).  Patients are able to view lab/test results, encounter notes, upcoming appointments, etc.  Non-urgent messages can be sent to your provider as well.   To learn more about what you can do with MyChart, go to [email or web address].    Your next appointment:   3 month(s)  The format for your next appointment:   In Person  Provider:   Buford Dresser, MD    Signed, Buford Dresser, MD  PhD 10/16/2019   [place]

## 2019-10-16 NOTE — Patient Instructions (Signed)
Medication Instructions:  No Changes In Medications at this time.  *If you need a refill on your cardiac medications before your next appointment, please call your pharmacy*  Lab Work: None Ordered At This Time.  If you have labs (blood work) drawn today and your tests are completely normal, you will receive your results only by: Marland Kitchen MyChart Message (if you have MyChart) OR . A paper copy in the mail If you have any lab test that is abnormal or we need to change your treatment, we will call you to review the results.  Testing/Procedures: None Ordered At This Time.   Follow-Up: At [facility], you and your health needs are our priority.  As part of our continuing mission to provide you with exceptional heart care, we have created designated Provider Care Teams.  These Care Teams include your primary Cardiologist (physician) and Advanced Practice Providers (APPs -  Physician Assistants and Nurse Practitioners) who all work together to provide you with the care you need, when you need it.  We recommend signing up for the patient portal called "MyChart".  Sign up information is provided on this After Visit Summary.  MyChart is used to connect with patients for Virtual Visits (Telemedicine).  Patients are able to view lab/test results, encounter notes, upcoming appointments, etc.  Non-urgent messages can be sent to your provider as well.   To learn more about what you can do with MyChart, go to [email or web address].    Your next appointment:   3 month(s)  The format for your next appointment:   In Person  Provider:   Buford Dresser, MD

## 2019-10-19 ENCOUNTER — Encounter: Payer: Self-pay | Admitting: Cardiology

## 2019-10-26 ENCOUNTER — Encounter: Payer: Self-pay | Admitting: Cardiology

## 2019-11-03 ENCOUNTER — Telehealth: Payer: Self-pay

## 2019-11-03 NOTE — Telephone Encounter (Signed)
Telephone call to patient to schedule palliative care visit with patient. SW received a voice message. SW left a message for her son, requesting a call back to schedule a visit.

## 2019-11-05 DIAGNOSIS — J8410 Pulmonary fibrosis, unspecified: Secondary | ICD-10-CM | POA: Diagnosis not present

## 2019-11-28 ENCOUNTER — Other Ambulatory Visit: Payer: Self-pay | Admitting: Pulmonary Disease

## 2019-11-28 ENCOUNTER — Telehealth: Payer: Self-pay | Admitting: Pulmonary Disease

## 2019-11-28 ENCOUNTER — Other Ambulatory Visit: Payer: PPO | Admitting: *Deleted

## 2019-11-28 ENCOUNTER — Other Ambulatory Visit: Payer: Self-pay

## 2019-11-28 VITALS — BP 94/82 | HR 70 | Temp 97.6°F | Resp 20

## 2019-11-28 DIAGNOSIS — Z515 Encounter for palliative care: Secondary | ICD-10-CM

## 2019-11-28 MED ORDER — PREDNISONE 10 MG PO TABS: 10.0000 mg | ORAL_TABLET | Freq: Two times a day (BID) | ORAL | 0 refills | Status: AC

## 2019-11-28 NOTE — Telephone Encounter (Signed)
Spoke with patient's son. He is aware that AO has called in prednisone for her. Verbalized understanding. Nothing further needed at time of call.

## 2019-11-28 NOTE — Telephone Encounter (Signed)
I will send in a prescription for prednisone to be used for 5 to 7 day I will not add any antibiotic if she is not having any fevers or body aches  She can call us back if she is feeling acutely ill

## 2019-11-28 NOTE — Telephone Encounter (Signed)
Spoke with patient's son. He stated that she has been SOB more than usual. She has a cough but it has not changed. Denied any body aches or fevers. She has not been around anyone sick. Denied any increased fatigue. She is still using her 2L of O2. She has received both COVID vaccines.   [facility] [address].   AO, can you please advise? Thanks!

## 2019-11-28 NOTE — Progress Notes (Signed)
Prednisone prescription sent in for worsening shortness of breath, no symptoms suggesting infection

## 2019-12-01 NOTE — Progress Notes (Signed)
COMMUNITY PALLIATIVE CARE RN NOTE  PATIENT NAME: Virginia Bullock DOB: 1928/10/28 MRN: [id number]  PRIMARY CARE PROVIDER: Harlan Stains, MD  RESPONSIBLE PARTY:  Acct ID - Guarantor Home Phone Work Phone Relationship Acct Type  [id number] [name]  Self P/F     [place], [name], [place] [address]   Covid-19 Pre-screening Negative  PLAN OF CARE and INTERVENTION:  1. ADVANCE CARE PLANNING/GOALS OF CARE: Goal is for patient to remain in her home.  2. PATIENT/CAREGIVER EDUCATION: Symptom management, dyspnea management, safe mobility/transfers, s/s of infection and disease progression 3. DISEASE STATUS: Met with patient and her son, in patient's home. Upon arrival, patient came towards the doorway to answer it, however stopped in a nearby doorway to hold herself up. She is noticeably short of breath. She is wearing her oxygen at 2.5 L via [place]. She denies pain. I observe her ambulating back into her sunroom. She is walking at a slower pace, also having to be careful not to trip over her long tubing. She mainly holds onto furniture, walls, etc when she walks. No recent falls. Shortness of breath also noticed during conversation. She has been noticing that over the past 2 weeks, she is less able to perform the tasks she used to due to dyspnea and weakness. She has been mainly sleeping downstairs to avoid going up the steps. Occasional productive cough with yellow tinged sputum. She feels tired all of the time. She says she can fall asleep at any time such as when trying to crochet and even while standing up. She is noticing that she is becoming more irritable over minor issues. Her hands are more shaky and I had to assist her in placing her battery in her hearing aids. She reports not having much of an appetite. She is having more issues with acid reflux. She says she loves orange juice and pineapple juice, but unable to drink much of it d/t it's acidity. She says that she is not currently  taking her blood pressure medications because they have been running low. She only takes her Lasix about every other day or when her legs are more swollen. Pitting edema noted to bilateral lower extremities. She is experiencing some nausea, and feels that it is caused by one of her medications that she takes. She has friends that check on her daily. She continues to drive to places, such as the bank, where she doesn't have to get out of the car but states she hasn't driven in the past 2 weeks. One of her friends is taking her for her Covid-19 Booster vaccine tomorrow at 2pm. I recommended that patient try to see her Pulmonologist d/t worsening dyspnea to see if there is anything that could help her with this. Son contacted them during my visit and the office is to have someone return his call. She also has what appears to be a possible ingrown hair on her right anterior thigh. It is sore to touch, but not redness or warmth noted with palpation. She is applying an antibiotic ointment daily. Will continue to monitor.   HISTORY OF PRESENT ILLNESS: This is a 86 yo female with a history of pulmonary fibrosis, Afib w/RVR, hypothyroidism and osteoporosis. Palliative care team continues to follow patient. Will visit patient monthly and PRN.   CODE STATUS: Full code  ADVANCED DIRECTIVES: Y MOST FORM: no PPS: 60%   PHYSICAL EXAM:   VITALS: Today's Vitals   11/28/19 1410  BP: 94/82  Pulse: 70  Resp:  20  Temp: 97.6 F (36.4 C)  TempSrc: Temporal  SpO2: 98%  PainSc: 0-No pain    LUNGS: expiratory wheeze noted to left lower lobe CARDIAC: Cor RRR EXTREMITIES: Pitting edema noted to bilateral lower extremities SKIN: See note above  NEURO: Alert and oriented x 3, pleasant mood, HOH, increased generalized weakness, ambulatory   (Duration of visit and documentation 90 minutes)   Daryl Eastern, RN BSN

## 2019-12-06 DIAGNOSIS — J8410 Pulmonary fibrosis, unspecified: Secondary | ICD-10-CM | POA: Diagnosis not present

## 2019-12-16 ENCOUNTER — Other Ambulatory Visit: Payer: PPO

## 2019-12-16 DIAGNOSIS — Z515 Encounter for palliative care: Secondary | ICD-10-CM

## 2019-12-17 ENCOUNTER — Other Ambulatory Visit: Payer: Self-pay

## 2019-12-17 NOTE — Progress Notes (Signed)
COMMUNITY PALLIATIVE CARE SW NOTE  PATIENT NAME: Virginia Bullock DOB: November 26, 1928 MRN: [id number]  PRIMARY CARE PROVIDER: Harlan Stains, MD  RESPONSIBLE PARTY:  Acct ID - Guarantor Home Phone Work Phone Relationship Acct Type  [id number] [name]  Self P/F     [address], [name], [name] [address]     PLAN OF CARE and INTERVENTIONS:             1. GOALS OF CARE/ ADVANCE CARE PLANNING:  Goal is for patient to remain at home, independent and safe as possible. Patient is a FULL CODE. 2. SOCIAL/EMOTIONAL/SPIRITUAL ASSESSMENT/ INTERVENTIONS:  SW completed telephonic visit with patient's son-Virginia Bullock as he declined a face-to-face visit with patient at this time due to scheduling. Virginia Bullock report that patient has declined more where she is frail, weaker, and having more shortness of breath, but patient is stable. After a call in to her doctor, she was prescribed Prednisone. However, patient cancelled the prescription as she refused to take it. Patient is looking forward to keeping up holiday traditions, which Virginia Bullock feels is keeping her spirits up. Several of his siblings will be visiting over the next few weeks, which patient is looking forward to. She continues to ambulate independently, but slowly and will occasionally use her walker.SW provided active listening, supportive counseling, while assessing the patient's needs and comfort and coping of the PCG. SW will provide ongoing assessment of patient's psychosocial needs and comfort and provide support as needed.  3. PATIENT/CAREGIVER EDUCATION/ COPING:  Patient remains alert and oriented x3 and  optimistic about her condition. Patient has a supportive network of family and friends. 3.  4. PERSONAL EMERGENCY PLAN:  911 can be activated for emergencies. 5. COMMUNITY RESOURCES COORDINATION/ HEALTH CARE NAVIGATION:  SW reinforced access to palliative care support. 6. FINANCIAL/LEGAL CONCERNS/INTERVENTIONS:  None.     SOCIAL HX:  Social  History   Tobacco Use  . Smoking status: Former Smoker    Types: Cigarettes  . Smokeless tobacco: Never Used  . Tobacco comment: quit 25 years ago  Substance Use Topics  . Alcohol use: Yes    Comment: glass of wine daily    CODE STATUS: FULL CODE ADVANCED DIRECTIVES: No MOST FORM COMPLETE: No HOSPICE EDUCATION PROVIDED: No  PPS: Patient is alert and oriented x3. She ambulates independently, occasionally using her walker. She is experiencing increased weakness and shortness of breath. She is on continuous 02. She remain independent of her ADL's.   Duration of telephonic visit and documentation: 30 minutes      Katheren Puller, LCSW

## 2019-12-18 DIAGNOSIS — E441 Mild protein-calorie malnutrition: Secondary | ICD-10-CM | POA: Diagnosis not present

## 2019-12-18 DIAGNOSIS — J8410 Pulmonary fibrosis, unspecified: Secondary | ICD-10-CM | POA: Diagnosis not present

## 2019-12-18 DIAGNOSIS — D6869 Other thrombophilia: Secondary | ICD-10-CM | POA: Diagnosis not present

## 2019-12-18 DIAGNOSIS — D509 Iron deficiency anemia, unspecified: Secondary | ICD-10-CM | POA: Diagnosis not present

## 2019-12-18 DIAGNOSIS — I5021 Acute systolic (congestive) heart failure: Secondary | ICD-10-CM | POA: Diagnosis not present

## 2019-12-18 DIAGNOSIS — L989 Disorder of the skin and subcutaneous tissue, unspecified: Secondary | ICD-10-CM | POA: Diagnosis not present

## 2019-12-18 DIAGNOSIS — I480 Paroxysmal atrial fibrillation: Secondary | ICD-10-CM | POA: Diagnosis not present

## 2020-02-10 DEATH — deceased

## 2021-04-28 IMAGING — CT CT ABD-PELV W/ CM
2 of 5 series · 15 of 46 positions shown, 17 images · IV contrast (omnipaque)
Comparison: CT pelvis dated January 22, 2019

CLINICAL DATA: Abdominal distension.  Acute anemia.  Bloody stool.

EXAM:
CT ABDOMEN AND PELVIS WITH CONTRAST
TECHNIQUE: Multidetector CT imaging of the abdomen and pelvis was performed
using the standard protocol following bolus administration of
intravenous contrast.
CONTRAST:  100mL OMNIPAQUE IOHEXOL 300 MG/ML  SOLN

[Series 2: axial st · axial · 0.65mm/px · z∈[-478,-108]mm · 12 of 86 slices shown, 14 images]
[im 6/86  soft-tissue]
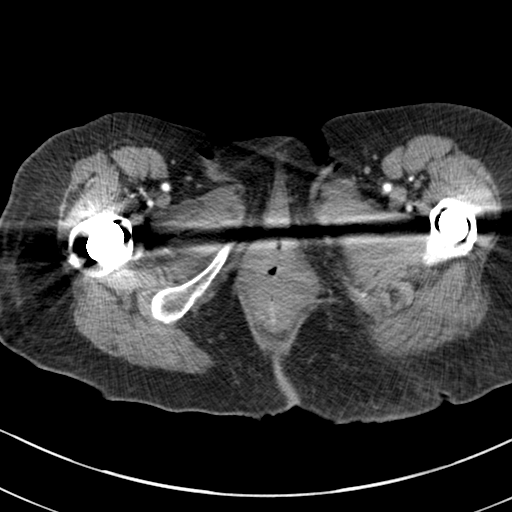
[im 6/86  bone]
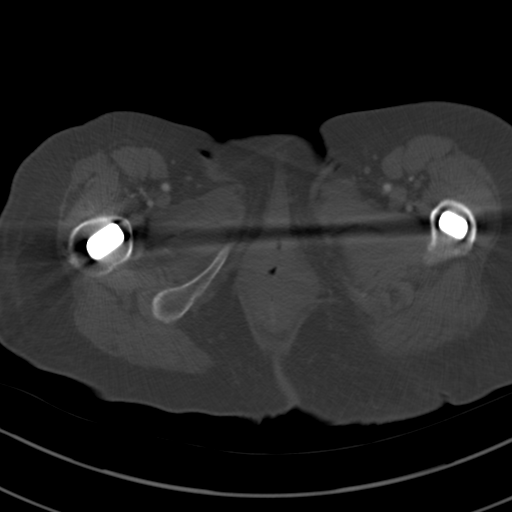
[im 12/86  soft-tissue]
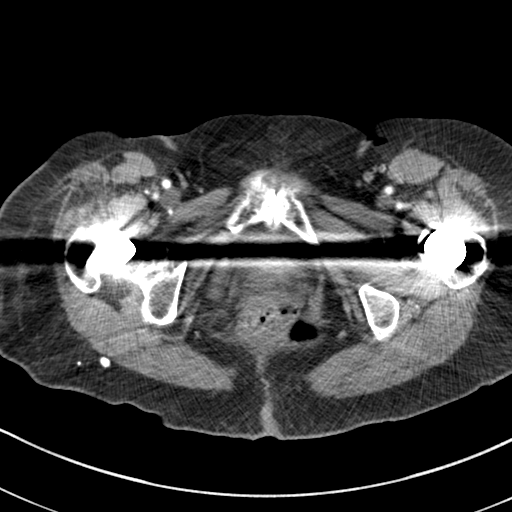
[im 18/86  soft-tissue]
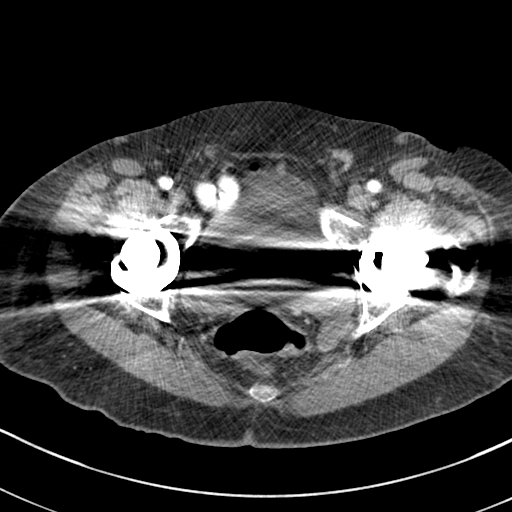
[im 29/86  soft-tissue]
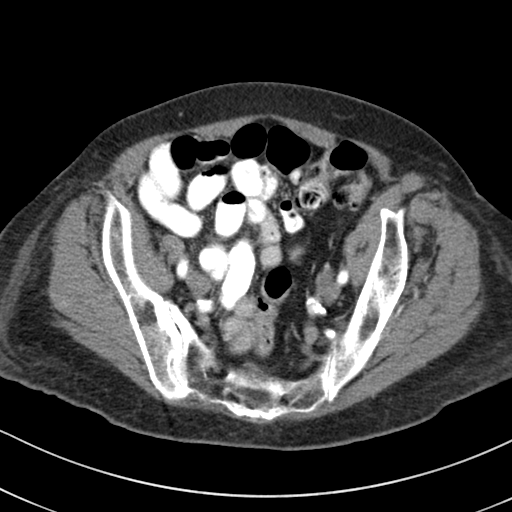
[im 35/86  soft-tissue]
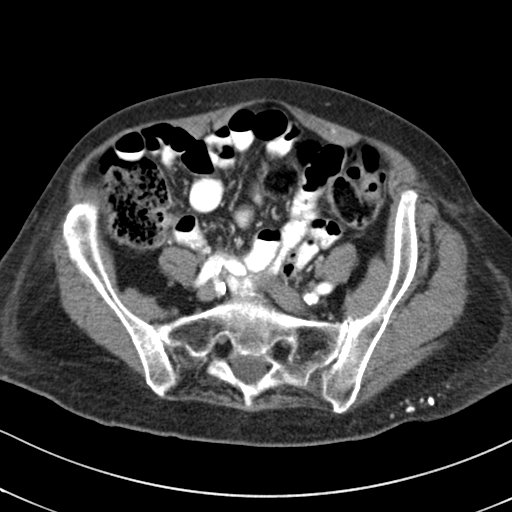
[im 40/86  soft-tissue]
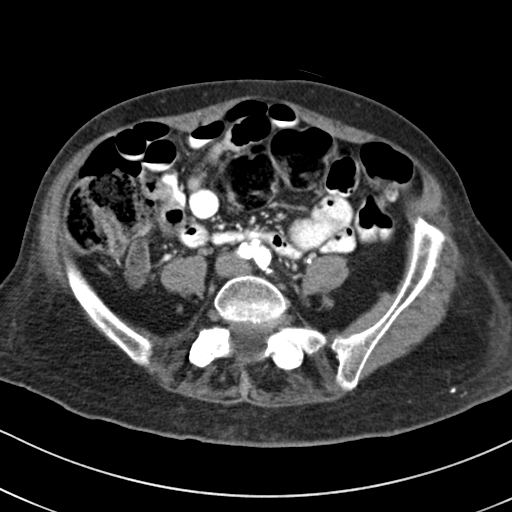
[im 46/86  soft-tissue]
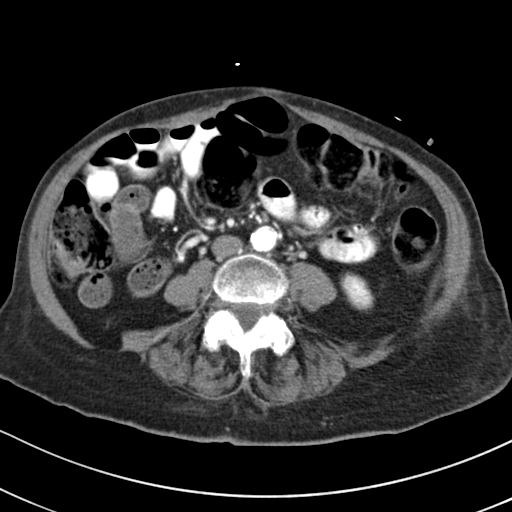
[im 52/86  soft-tissue]
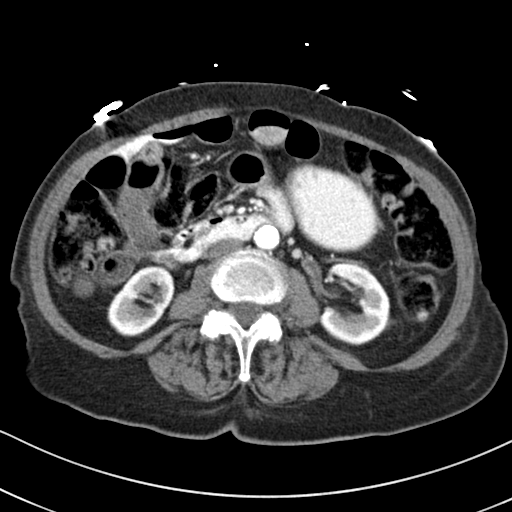
[im 57/86  soft-tissue]
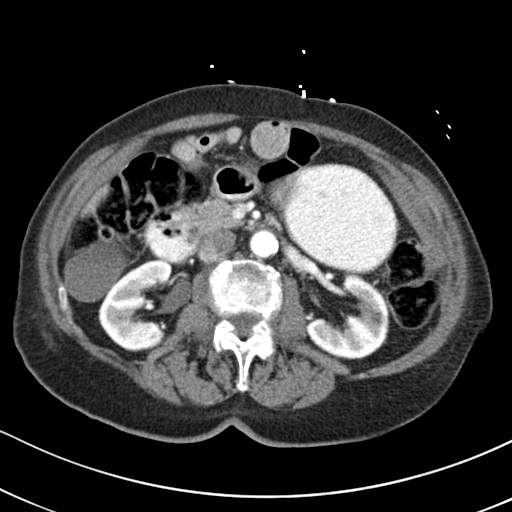
[im 57/86  bone]
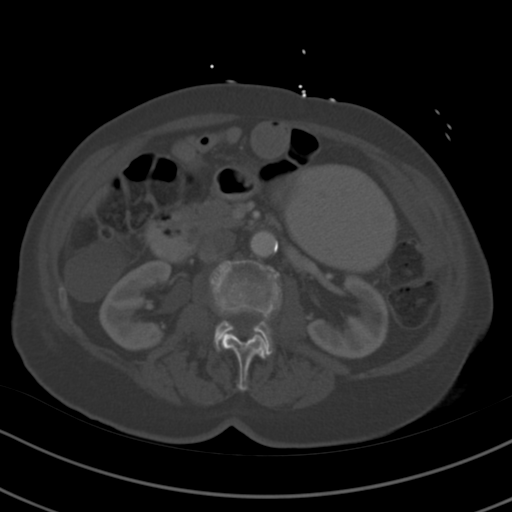
[im 69/86  soft-tissue]
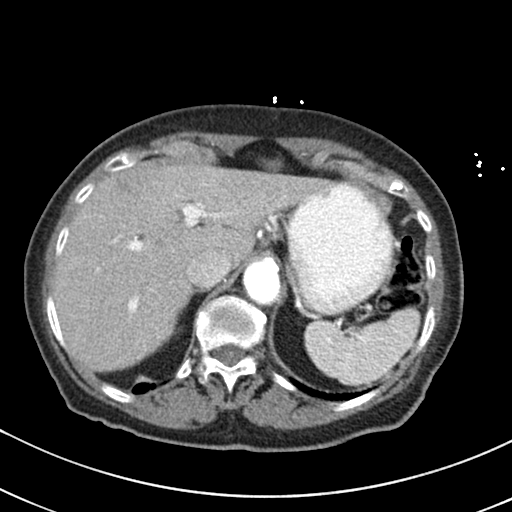
[im 74/86  soft-tissue]
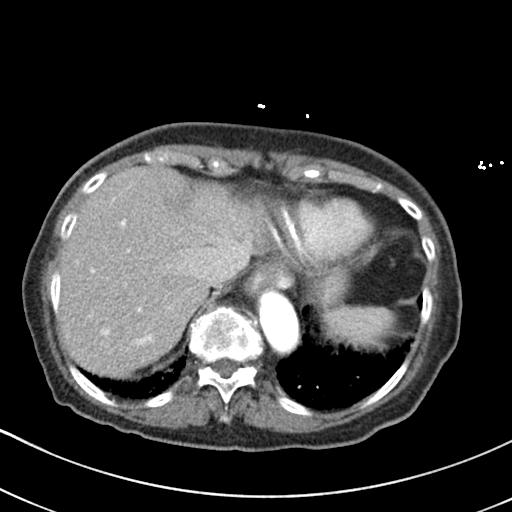
[im 80/86  soft-tissue]
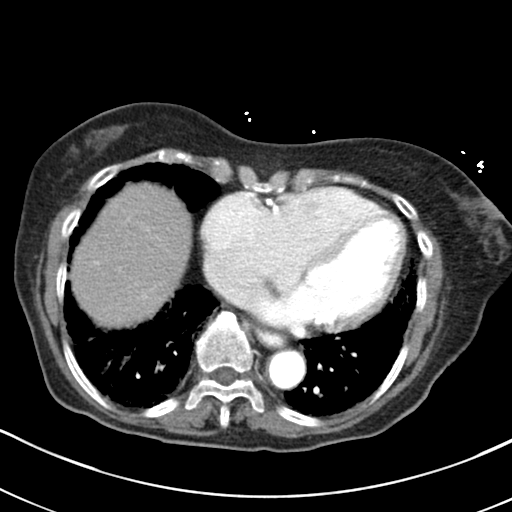

[Series 6: coronal st · coronal · 0.67mm/px · 3 of 127 slices shown]
[im 43/127  soft-tissue]
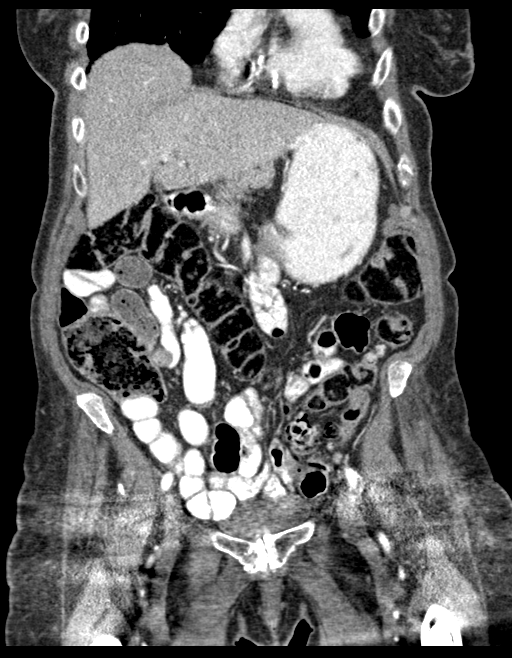
[im 57/127  soft-tissue]
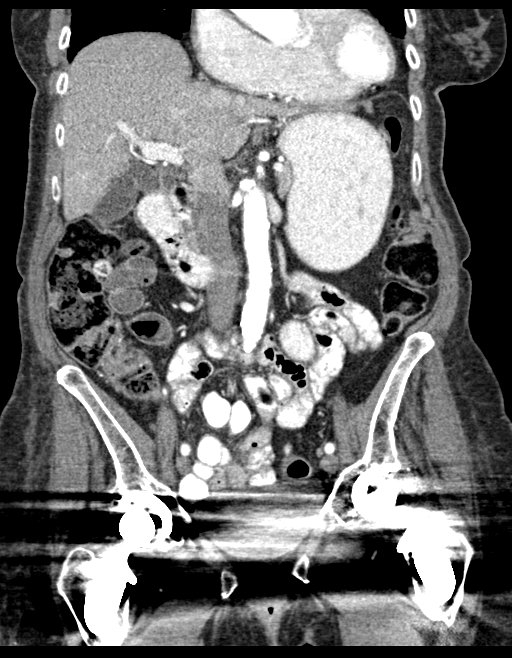
[im 71/127  soft-tissue]
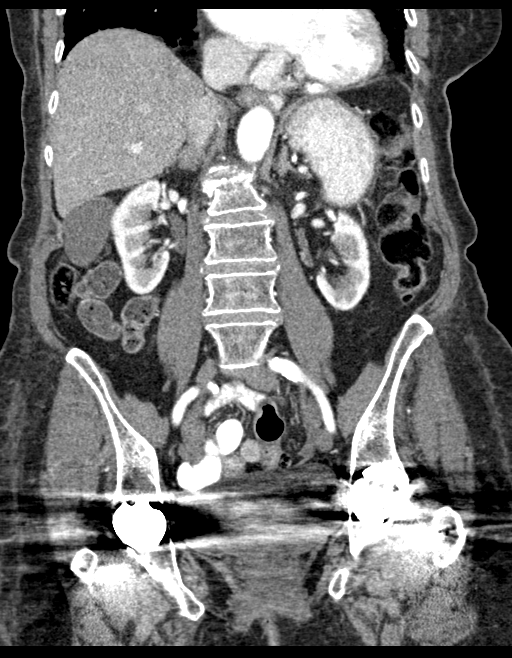

[15 of 46 positions shown; findings below may reference images not displayed]

FINDINGS: Lower chest: Pulmonary fibrosis is again noted at the lung bases
bilaterally.The heart is enlarged.

Hepatobiliary: There are few hepatic hypodensities that are favored
to represent cysts. Normal gallbladder.There is no biliary ductal
dilation.

Pancreas: Normal contours without ductal dilatation. No
peripancreatic fluid collection.

Spleen: No splenic laceration or hematoma.

Adrenals/Urinary Tract:

--Adrenal glands: No adrenal hemorrhage.

--Right kidney/ureter: No hydronephrosis or perinephric hematoma.

--Left kidney/ureter: No hydronephrosis or perinephric hematoma.

--Urinary bladder: The urinary bladder is poorly evaluated secondary
to extensive streak artifact through the patient's pelvis. There is
a probable posterior left bladder wall diverticulum.

Stomach/Bowel:

--Stomach/Duodenum: No hiatal hernia or other gastric abnormality.
Normal duodenal course and caliber.

--Small bowel: There is evidence for stasis involving several small
bowel loops in the right lower quadrant. There is no evidence for
small bowel obstruction.

--Colon: Rectosigmoid diverticulosis without acute inflammation.
There is a moderate amount of stool in the colon.

--Appendix: Not visualized. No right lower quadrant inflammation or
free fluid.

Vascular/Lymphatic: Atherosclerotic changes are noted throughout the
abdominal aorta without evidence for an abdominal aortic aneurysm.
There is mild to moderate narrowing at the origin of the SMA and
celiac axis.

--No retroperitoneal lymphadenopathy.

--No mesenteric lymphadenopathy.

--the pelvic lymph nodes are poorly evaluated secondary to extensive
streak artifact.

Reproductive: Not well evaluated on this exam. The patient is likely
status post prior hysterectomy.

Other: No ascites or free air. The abdominal wall is normal.

Musculoskeletal. Again noted is an acute compression fracture of the
superior endplate of the L2 vertebral body. Mild degenerative
changes are noted throughout the visualized thoracolumbar spine. The
patient is status post bilateral total hip arthroplasty. There is
extensive streak artifact through the pelvis from these prostheses.
IMPRESSION: 1. No acute abnormality detected.
2. Again noted are findings of pulmonary fibrosis at the lung bases.
3. Sigmoid diverticulosis without CT evidence for diverticulitis.
4. Acute L2 compression fracture again noted.
5. Additional chronic findings as detailed above

Aortic Atherosclerosis ([id number]).
